# Patient Record
Sex: Female | Born: 1935 | Race: White | Hispanic: No | Smoking: Never smoker
Health system: Southern US, Community
[De-identification: ages and names within clinical notes are randomized; demographics above are authoritative.]

## PROBLEM LIST (undated history)

## (undated) DIAGNOSIS — T7840XA Allergy, unspecified, initial encounter: Secondary | ICD-10-CM

## (undated) DIAGNOSIS — J189 Pneumonia, unspecified organism: Secondary | ICD-10-CM

## (undated) DIAGNOSIS — M8580 Other specified disorders of bone density and structure, unspecified site: Secondary | ICD-10-CM

## (undated) DIAGNOSIS — K759 Inflammatory liver disease, unspecified: Secondary | ICD-10-CM

## (undated) DIAGNOSIS — E0590 Thyrotoxicosis, unspecified without thyrotoxic crisis or storm: Secondary | ICD-10-CM

## (undated) DIAGNOSIS — G8929 Other chronic pain: Secondary | ICD-10-CM

## (undated) DIAGNOSIS — N6019 Diffuse cystic mastopathy of unspecified breast: Secondary | ICD-10-CM

## (undated) DIAGNOSIS — Z87898 Personal history of other specified conditions: Secondary | ICD-10-CM

## (undated) DIAGNOSIS — G912 (Idiopathic) normal pressure hydrocephalus: Secondary | ICD-10-CM

## (undated) DIAGNOSIS — M810 Age-related osteoporosis without current pathological fracture: Secondary | ICD-10-CM

## (undated) DIAGNOSIS — E079 Disorder of thyroid, unspecified: Secondary | ICD-10-CM

## (undated) DIAGNOSIS — K219 Gastro-esophageal reflux disease without esophagitis: Secondary | ICD-10-CM

## (undated) DIAGNOSIS — Z982 Presence of cerebrospinal fluid drainage device: Secondary | ICD-10-CM

## (undated) DIAGNOSIS — E559 Vitamin D deficiency, unspecified: Secondary | ICD-10-CM

## (undated) DIAGNOSIS — M25551 Pain in right hip: Secondary | ICD-10-CM

## (undated) DIAGNOSIS — M779 Enthesopathy, unspecified: Secondary | ICD-10-CM

## (undated) DIAGNOSIS — E042 Nontoxic multinodular goiter: Secondary | ICD-10-CM

## (undated) DIAGNOSIS — E210 Primary hyperparathyroidism: Secondary | ICD-10-CM

## (undated) DIAGNOSIS — I10 Essential (primary) hypertension: Secondary | ICD-10-CM

## (undated) DIAGNOSIS — M1712 Unilateral primary osteoarthritis, left knee: Secondary | ICD-10-CM

## (undated) DIAGNOSIS — E785 Hyperlipidemia, unspecified: Secondary | ICD-10-CM

## (undated) HISTORY — PX: CATARACT EXTRACTION: SUR2

## (undated) HISTORY — PX: EYE SURGERY: SHX253

## (undated) HISTORY — DX: Unilateral primary osteoarthritis, left knee: M17.12

## (undated) HISTORY — DX: Enthesopathy, unspecified: M77.9

## (undated) HISTORY — DX: Personal history of other specified conditions: Z87.898

## (undated) HISTORY — DX: Gastro-esophageal reflux disease without esophagitis: K21.9

## (undated) HISTORY — PX: COLON SURGERY: SHX602

## (undated) HISTORY — PX: APPENDECTOMY: SHX54

## (undated) HISTORY — DX: Other chronic pain: G89.29

## (undated) HISTORY — DX: Nontoxic multinodular goiter: E04.2

## (undated) HISTORY — DX: Age-related osteoporosis without current pathological fracture: M81.0

## (undated) HISTORY — DX: Essential (primary) hypertension: I10

## (undated) HISTORY — DX: Other specified disorders of bone density and structure, unspecified site: M85.80

## (undated) HISTORY — PX: SHOULDER ARTHROSCOPY W/ ACROMIAL REPAIR: SUR94

## (undated) HISTORY — DX: Vitamin D deficiency, unspecified: E55.9

## (undated) HISTORY — DX: Disorder of thyroid, unspecified: E07.9

## (undated) HISTORY — PX: BREAST SURGERY: SHX581

## (undated) HISTORY — PX: FRACTURE SURGERY: SHX138

## (undated) HISTORY — DX: Pain in right hip: M25.551

## (undated) HISTORY — PX: TUBAL LIGATION: SHX77

## (undated) HISTORY — PX: ABDOMINAL HYSTERECTOMY: SHX81

## (undated) HISTORY — PX: BREAST EXCISIONAL BIOPSY: SUR124

## (undated) HISTORY — DX: Allergy, unspecified, initial encounter: T78.40XA

## (undated) HISTORY — DX: Hyperlipidemia, unspecified: E78.5

## (undated) HISTORY — DX: Primary hyperparathyroidism: E21.0

## (undated) HISTORY — DX: Presence of cerebrospinal fluid drainage device: Z98.2

## (undated) HISTORY — DX: (Idiopathic) normal pressure hydrocephalus: G91.2

---

## 1983-03-03 HISTORY — PX: ACOUSTIC NEUROMA RESECTION: SHX5713

## 2010-01-20 DIAGNOSIS — Z86018 Personal history of other benign neoplasm: Secondary | ICD-10-CM | POA: Insufficient documentation

## 2010-01-20 DIAGNOSIS — S42209A Unspecified fracture of upper end of unspecified humerus, initial encounter for closed fracture: Secondary | ICD-10-CM | POA: Insufficient documentation

## 2010-01-20 DIAGNOSIS — K635 Polyp of colon: Secondary | ICD-10-CM | POA: Insufficient documentation

## 2010-01-20 DIAGNOSIS — I10 Essential (primary) hypertension: Secondary | ICD-10-CM | POA: Insufficient documentation

## 2010-01-20 DIAGNOSIS — M8580 Other specified disorders of bone density and structure, unspecified site: Secondary | ICD-10-CM

## 2010-01-20 DIAGNOSIS — D333 Benign neoplasm of cranial nerves: Secondary | ICD-10-CM

## 2010-01-20 DIAGNOSIS — M1712 Unilateral primary osteoarthritis, left knee: Secondary | ICD-10-CM

## 2010-01-20 DIAGNOSIS — R609 Edema, unspecified: Secondary | ICD-10-CM | POA: Insufficient documentation

## 2010-01-20 HISTORY — DX: Benign neoplasm of cranial nerves: D33.3

## 2010-01-20 HISTORY — DX: Other specified disorders of bone density and structure, unspecified site: M85.80

## 2010-01-20 HISTORY — DX: Unilateral primary osteoarthritis, left knee: M17.12

## 2011-06-02 ENCOUNTER — Ambulatory Visit: Payer: Self-pay | Admitting: Internal Medicine

## 2011-06-30 ENCOUNTER — Ambulatory Visit: Payer: Self-pay | Admitting: Gynecologic Oncology

## 2011-07-01 ENCOUNTER — Ambulatory Visit: Payer: Self-pay | Admitting: Gynecologic Oncology

## 2011-08-19 ENCOUNTER — Ambulatory Visit: Payer: Self-pay | Admitting: Gynecologic Oncology

## 2011-08-20 LAB — CA 125: CA 125: 16.9 U/mL (ref 0.0–34.0)

## 2011-08-25 LAB — CREATININE, SERUM: EGFR (Non-African Amer.): >60

## 2011-08-31 ENCOUNTER — Ambulatory Visit: Payer: Self-pay | Admitting: Gynecologic Oncology

## 2011-10-01 ENCOUNTER — Ambulatory Visit: Payer: Self-pay | Admitting: Gynecologic Oncology

## 2011-10-20 ENCOUNTER — Ambulatory Visit: Payer: Self-pay | Admitting: Gynecologic Oncology

## 2011-10-28 ENCOUNTER — Ambulatory Visit: Payer: Self-pay | Admitting: Gynecologic Oncology

## 2011-10-28 LAB — BASIC METABOLIC PANEL
BUN: 11 mg/dL (ref 7–18)
Calcium, Total: 9.6 mg/dL (ref 8.5–10.1)
Creatinine: 0.66 mg/dL (ref 0.60–1.30)
EGFR (African American): >60
EGFR (Non-African Amer.): >60
Glucose: 81 mg/dL (ref 65–99)
Osmolality: 278 (ref 275–301)
Potassium: 3.1 mmol/L — ABNORMAL LOW (ref 3.5–5.1)
Sodium: 140 mmol/L (ref 136–145)

## 2011-10-28 LAB — CBC
HGB: 12.8 g/dL (ref 12.0–16.0)
MCV: 89 fL (ref 80–100)
Platelet: 262 10*3/uL (ref 150–440)
RBC: 4.23 10*6/uL (ref 3.80–5.20)
RDW: 13.3 % (ref 11.5–14.5)
WBC: 6.8 10*3/uL (ref 3.6–11.0)

## 2011-11-01 ENCOUNTER — Ambulatory Visit: Payer: Self-pay | Admitting: Gynecologic Oncology

## 2011-11-10 ENCOUNTER — Ambulatory Visit: Payer: Self-pay | Admitting: Gynecologic Oncology

## 2011-12-01 ENCOUNTER — Ambulatory Visit: Payer: Self-pay

## 2012-02-10 ENCOUNTER — Ambulatory Visit: Payer: Self-pay | Admitting: Podiatry

## 2012-04-20 ENCOUNTER — Ambulatory Visit: Payer: Self-pay | Admitting: Internal Medicine

## 2012-05-23 DIAGNOSIS — E042 Nontoxic multinodular goiter: Secondary | ICD-10-CM | POA: Insufficient documentation

## 2012-05-23 HISTORY — DX: Nontoxic multinodular goiter: E04.2

## 2012-06-30 ENCOUNTER — Emergency Department: Payer: Self-pay | Admitting: Emergency Medicine

## 2012-06-30 LAB — TROPONIN I: Troponin-I: <0.02 ng/mL

## 2012-06-30 LAB — COMPREHENSIVE METABOLIC PANEL
Albumin: 3.8 g/dL (ref 3.4–5.0)
BUN: 17 mg/dL (ref 7–18)
Bilirubin,Total: 0.3 mg/dL (ref 0.2–1.0)
Chloride: 110 mmol/L — ABNORMAL HIGH (ref 98–107)
Creatinine: 0.59 mg/dL — ABNORMAL LOW (ref 0.60–1.30)
EGFR (African American): >60
EGFR (Non-African Amer.): >60
Glucose: 97 mg/dL (ref 65–99)
Osmolality: 288 (ref 275–301)
Potassium: 3.6 mmol/L (ref 3.5–5.1)
SGOT(AST): 31 U/L (ref 15–37)
SGPT (ALT): 31 U/L (ref 12–78)

## 2012-06-30 LAB — URINALYSIS, COMPLETE
Bilirubin,UR: NEGATIVE
Blood: NEGATIVE
Glucose,UR: NEGATIVE mg/dL (ref 0–75)
Ketone: NEGATIVE
Nitrite: NEGATIVE
Ph: 7 (ref 4.5–8.0)
Protein: NEGATIVE
WBC UR: 2 /HPF (ref 0–5)

## 2012-06-30 LAB — CBC
HCT: 38 % (ref 35.0–47.0)
MCH: 29.6 pg (ref 26.0–34.0)
MCHC: 34.2 g/dL (ref 32.0–36.0)
Platelet: 251 10*3/uL (ref 150–440)
WBC: 6.2 10*3/uL (ref 3.6–11.0)

## 2012-07-22 ENCOUNTER — Observation Stay: Payer: Self-pay | Admitting: Internal Medicine

## 2012-07-22 LAB — URINALYSIS, COMPLETE
Bilirubin,UR: NEGATIVE
Glucose,UR: NEGATIVE mg/dL (ref 0–75)
Hyaline Cast: 9
Ketone: NEGATIVE
Nitrite: NEGATIVE
Protein: NEGATIVE
RBC,UR: 1 /HPF (ref 0–5)

## 2012-07-22 LAB — COMPREHENSIVE METABOLIC PANEL
Albumin: 4.3 g/dL (ref 3.4–5.0)
Alkaline Phosphatase: 88 U/L (ref 50–136)
Anion Gap: 6 — ABNORMAL LOW (ref 7–16)
BUN: 10 mg/dL (ref 7–18)
Bilirubin,Total: 0.3 mg/dL (ref 0.2–1.0)
Calcium, Total: 10.4 mg/dL — ABNORMAL HIGH (ref 8.5–10.1)
Chloride: 108 mmol/L — ABNORMAL HIGH (ref 98–107)
Creatinine: 0.68 mg/dL (ref 0.60–1.30)
EGFR (African American): >60
EGFR (Non-African Amer.): >60
Glucose: 96 mg/dL (ref 65–99)
Potassium: 3.6 mmol/L (ref 3.5–5.1)
SGOT(AST): 32 U/L (ref 15–37)
Sodium: 142 mmol/L (ref 136–145)

## 2012-07-22 LAB — CBC
HCT: 42.7 % (ref 35.0–47.0)
HGB: 14.4 g/dL (ref 12.0–16.0)
MCV: 86 fL (ref 80–100)
RDW: 13.4 % (ref 11.5–14.5)
WBC: 7.6 10*3/uL (ref 3.6–11.0)

## 2012-07-23 LAB — CBC WITH DIFFERENTIAL/PLATELET
Eosinophil %: 1.8 %
HCT: 36.8 % (ref 35.0–47.0)
HGB: 12.8 g/dL (ref 12.0–16.0)
Lymphocyte %: 30.5 %
MCH: 29.6 pg (ref 26.0–34.0)
MCHC: 34.6 g/dL (ref 32.0–36.0)
MCV: 86 fL (ref 80–100)
Neutrophil %: 56.9 %
RBC: 4.3 10*6/uL (ref 3.80–5.20)
RDW: 13.1 % (ref 11.5–14.5)
WBC: 7.5 10*3/uL (ref 3.6–11.0)

## 2012-07-23 LAB — BASIC METABOLIC PANEL
Anion Gap: 5 — ABNORMAL LOW (ref 7–16)
Calcium, Total: 9.8 mg/dL (ref 8.5–10.1)
Potassium: 3.4 mmol/L — ABNORMAL LOW (ref 3.5–5.1)
Sodium: 142 mmol/L (ref 136–145)

## 2012-08-26 DIAGNOSIS — R269 Unspecified abnormalities of gait and mobility: Secondary | ICD-10-CM | POA: Insufficient documentation

## 2012-09-08 HISTORY — PX: VENTRICULOPERITONEAL SHUNT: SHX204

## 2012-09-08 HISTORY — PX: BRAIN SURGERY: SHX531

## 2012-11-05 DIAGNOSIS — G912 (Idiopathic) normal pressure hydrocephalus: Secondary | ICD-10-CM

## 2012-11-05 HISTORY — DX: (Idiopathic) normal pressure hydrocephalus: G91.2

## 2013-02-01 ENCOUNTER — Ambulatory Visit: Payer: Self-pay | Admitting: Family Medicine

## 2013-05-03 ENCOUNTER — Ambulatory Visit: Payer: Self-pay | Admitting: Pediatrics

## 2013-08-02 DIAGNOSIS — E210 Primary hyperparathyroidism: Secondary | ICD-10-CM

## 2013-08-02 HISTORY — DX: Primary hyperparathyroidism: E21.0

## 2013-08-30 IMAGING — CR DG CHEST 1V PORT
1 series · 1 of 1 positions shown · non-contrast
Comparison: none

REASON FOR EXAM: syncopy
COMMENTS:

PROCEDURE:     DXR - DXR PORTABLE CHEST SINGLE VIEW  - July 22, 2012  [DATE]
RESULT:     Comparison: None.

[ap]
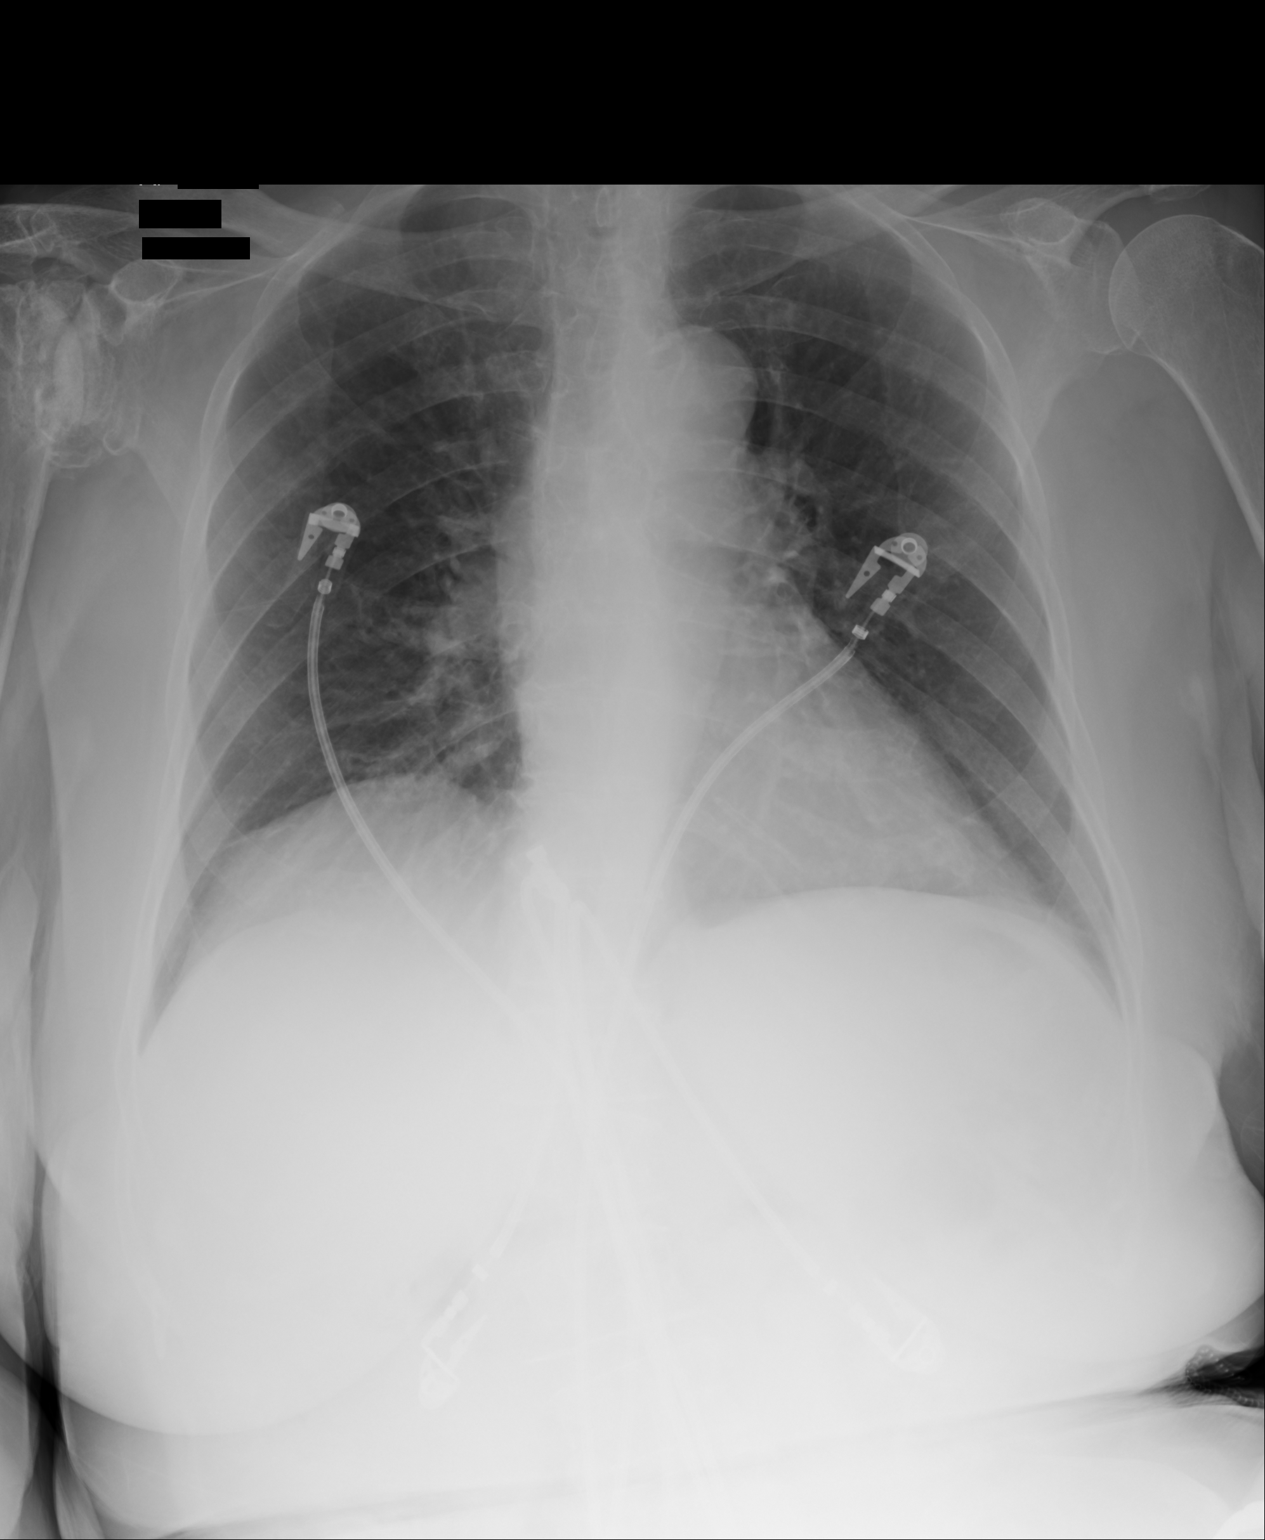

[1 of 1 positions shown; findings below may reference images not displayed]

FINDINGS: The heart is normal in size. No focal pulmonary opacities. There are
extensive degenerative changes in the right shoulder.
IMPRESSION: No acute cardiopulmonary disease.

[REDACTED]

## 2013-11-30 DIAGNOSIS — G912 (Idiopathic) normal pressure hydrocephalus: Secondary | ICD-10-CM

## 2013-11-30 HISTORY — DX: (Idiopathic) normal pressure hydrocephalus: G91.2

## 2014-03-14 DIAGNOSIS — M5442 Lumbago with sciatica, left side: Secondary | ICD-10-CM | POA: Diagnosis not present

## 2014-03-14 DIAGNOSIS — M9903 Segmental and somatic dysfunction of lumbar region: Secondary | ICD-10-CM | POA: Diagnosis not present

## 2014-03-14 DIAGNOSIS — M9905 Segmental and somatic dysfunction of pelvic region: Secondary | ICD-10-CM | POA: Diagnosis not present

## 2014-03-14 DIAGNOSIS — M955 Acquired deformity of pelvis: Secondary | ICD-10-CM | POA: Diagnosis not present

## 2014-03-14 DIAGNOSIS — M546 Pain in thoracic spine: Secondary | ICD-10-CM | POA: Diagnosis not present

## 2014-03-14 DIAGNOSIS — M9902 Segmental and somatic dysfunction of thoracic region: Secondary | ICD-10-CM | POA: Diagnosis not present

## 2014-04-10 DIAGNOSIS — M9901 Segmental and somatic dysfunction of cervical region: Secondary | ICD-10-CM | POA: Diagnosis not present

## 2014-04-10 DIAGNOSIS — M546 Pain in thoracic spine: Secondary | ICD-10-CM | POA: Diagnosis not present

## 2014-04-10 DIAGNOSIS — M955 Acquired deformity of pelvis: Secondary | ICD-10-CM | POA: Diagnosis not present

## 2014-04-10 DIAGNOSIS — M5441 Lumbago with sciatica, right side: Secondary | ICD-10-CM | POA: Diagnosis not present

## 2014-04-10 DIAGNOSIS — M9902 Segmental and somatic dysfunction of thoracic region: Secondary | ICD-10-CM | POA: Diagnosis not present

## 2014-04-10 DIAGNOSIS — M9905 Segmental and somatic dysfunction of pelvic region: Secondary | ICD-10-CM | POA: Diagnosis not present

## 2014-04-24 DIAGNOSIS — I10 Essential (primary) hypertension: Secondary | ICD-10-CM | POA: Diagnosis not present

## 2014-04-24 DIAGNOSIS — E210 Primary hyperparathyroidism: Secondary | ICD-10-CM | POA: Diagnosis not present

## 2014-04-24 DIAGNOSIS — R413 Other amnesia: Secondary | ICD-10-CM | POA: Diagnosis not present

## 2014-04-24 DIAGNOSIS — Z79899 Other long term (current) drug therapy: Secondary | ICD-10-CM | POA: Diagnosis not present

## 2014-04-24 DIAGNOSIS — Z23 Encounter for immunization: Secondary | ICD-10-CM | POA: Diagnosis not present

## 2014-04-24 DIAGNOSIS — Z6823 Body mass index (BMI) 23.0-23.9, adult: Secondary | ICD-10-CM | POA: Diagnosis not present

## 2014-04-24 DIAGNOSIS — E042 Nontoxic multinodular goiter: Secondary | ICD-10-CM | POA: Diagnosis not present

## 2014-04-24 DIAGNOSIS — Z1231 Encounter for screening mammogram for malignant neoplasm of breast: Secondary | ICD-10-CM | POA: Diagnosis not present

## 2014-04-24 DIAGNOSIS — M25561 Pain in right knee: Secondary | ICD-10-CM | POA: Diagnosis not present

## 2014-04-24 DIAGNOSIS — E785 Hyperlipidemia, unspecified: Secondary | ICD-10-CM | POA: Diagnosis not present

## 2014-04-24 DIAGNOSIS — Z0000 Encounter for general adult medical examination without abnormal findings: Secondary | ICD-10-CM | POA: Diagnosis not present

## 2014-05-08 ENCOUNTER — Ambulatory Visit: Payer: Self-pay | Admitting: Pediatrics

## 2014-05-08 DIAGNOSIS — M955 Acquired deformity of pelvis: Secondary | ICD-10-CM | POA: Diagnosis not present

## 2014-05-08 DIAGNOSIS — M9902 Segmental and somatic dysfunction of thoracic region: Secondary | ICD-10-CM | POA: Diagnosis not present

## 2014-05-08 DIAGNOSIS — Z1231 Encounter for screening mammogram for malignant neoplasm of breast: Secondary | ICD-10-CM | POA: Diagnosis not present

## 2014-05-08 DIAGNOSIS — M9905 Segmental and somatic dysfunction of pelvic region: Secondary | ICD-10-CM | POA: Diagnosis not present

## 2014-05-08 DIAGNOSIS — M9901 Segmental and somatic dysfunction of cervical region: Secondary | ICD-10-CM | POA: Diagnosis not present

## 2014-05-08 DIAGNOSIS — M546 Pain in thoracic spine: Secondary | ICD-10-CM | POA: Diagnosis not present

## 2014-05-08 DIAGNOSIS — M5441 Lumbago with sciatica, right side: Secondary | ICD-10-CM | POA: Diagnosis not present

## 2014-05-21 DIAGNOSIS — E213 Hyperparathyroidism, unspecified: Secondary | ICD-10-CM | POA: Diagnosis not present

## 2014-05-21 DIAGNOSIS — E210 Primary hyperparathyroidism: Secondary | ICD-10-CM | POA: Diagnosis not present

## 2014-05-21 DIAGNOSIS — E042 Nontoxic multinodular goiter: Secondary | ICD-10-CM | POA: Diagnosis not present

## 2014-05-24 DIAGNOSIS — R569 Unspecified convulsions: Secondary | ICD-10-CM | POA: Diagnosis not present

## 2014-05-24 DIAGNOSIS — R42 Dizziness and giddiness: Secondary | ICD-10-CM | POA: Diagnosis not present

## 2014-05-24 DIAGNOSIS — R6889 Other general symptoms and signs: Secondary | ICD-10-CM | POA: Diagnosis not present

## 2014-05-24 DIAGNOSIS — R413 Other amnesia: Secondary | ICD-10-CM | POA: Diagnosis not present

## 2014-06-06 DIAGNOSIS — M546 Pain in thoracic spine: Secondary | ICD-10-CM | POA: Diagnosis not present

## 2014-06-06 DIAGNOSIS — M5441 Lumbago with sciatica, right side: Secondary | ICD-10-CM | POA: Diagnosis not present

## 2014-06-06 DIAGNOSIS — M9902 Segmental and somatic dysfunction of thoracic region: Secondary | ICD-10-CM | POA: Diagnosis not present

## 2014-06-06 DIAGNOSIS — M9901 Segmental and somatic dysfunction of cervical region: Secondary | ICD-10-CM | POA: Diagnosis not present

## 2014-06-06 DIAGNOSIS — M9905 Segmental and somatic dysfunction of pelvic region: Secondary | ICD-10-CM | POA: Diagnosis not present

## 2014-06-06 DIAGNOSIS — M955 Acquired deformity of pelvis: Secondary | ICD-10-CM | POA: Diagnosis not present

## 2014-06-19 NOTE — Op Note (Signed)
PATIENT NAME:  Danielle, Herrera MR#:  [id number] DATE OF BIRTH:  [birth year]  DATE OF PROCEDURE:  11/10/2011  PREOPERATIVE DIAGNOSIS: Pelvic mass.   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   PROCEDURE PERFORMED: Laparoscopy with bilateral salpingo-oophorectomy.   SURGEON: Weber Cooks, M.D.   ANESTHESIA: General.   ESTIMATED BLOOD LOSS: 20 mL.   COMPLICATIONS: None.   INDICATION FOR SURGERY: Danielle Herrera is a 81 year old patient who complained of an episode of pelvic pain and on evaluation was noted to have a 5 cm slightly complex mass on the right ovary. The pain resolved but the decision was made to proceed with bilateral salpingo-oophorectomy.   FINDINGS AT TIME OF SURGERY: Uterus deformed by multiple fibroids. Left ovary within normal limits. Adhesions between sigmoid and left pelvic sidewall. Right ovary enlarged by smooth appearing cyst draining clear mucinous fluid. Tube within normal limits. No adhesions. No papillations. The remainder of inspection of the pelvis and abdomen within normal limits.   OPERATIVE REPORT: After adequate general anesthesia had been obtained, the patient was prepped and draped in ski position. A Foley catheter was inserted. The cervix was visualized with a single-tooth tenaculum and grasped. After a small incision, the cervical canal could be entered and a Hulka manipulator was inserted.   Attention was then directed towards the abdomen. A 1 cm incision was placed in the umbilicus. The fascia was identified and transected. The peritoneum was identified and entered. Inspection was done with the above-mentioned findings. Cytology was obtained.   The pelvic sidewall was entered on the right side. The vessels and ureter were identified. The infundibulopelvic ligament was cauterized and transected. The incision was then continued towards the uterus. Finally, the tube and utero-ovarian ligament were cauterized and transected. The same procedure  was performed on the left side after adhesions between sigmoid colon and pelvic sidewall were lysed. Again hemostasis was adequate here. The ovaries were placed in Endo Catch bags and removed through the umbilical incision while observing through a 5 mm port. At the end of the procedure, hemostasis was adequate. Ports were removed under direct vision.   The patient tolerated the procedure well and was taken to the recovery room in stable condition. The postoperative urine was clear. The Foley catheter was removed. Sponge, instrument and needle counts were correct x2. ____________________________ Weber Cooks, MD bem:slb D: 11/10/2011 16:17:59 ET T: 11/10/2011 16:54:46 ET JOB#: [id number]  cc: Weber Cooks, MD, <Dictator> Weber Cooks MD ELECTRONICALLY SIGNED 11/17/2011 9:53

## 2014-06-22 NOTE — H&P (Signed)
PATIENT NAME:  Danielle Herrera, CAMPUS MR#:  [id number] DATE OF BIRTH:  Aug 28, 1933  DATE OF ADMISSION:  07/22/2012  ADMITTING PHYSICIAN: Dr. Gladstone Lighter  PRIMARY PHYSICIAN: Dr. Halina Maidens  PRIMARY NEUROLOGIST: Dr. Melrose Nakayama    CHIEF COMPLAINT:  Syncope.  HISTORY OF PRESENT ILLNESS: Danielle Herrera is a 81 year old female with a past medical history significant for a history of acoustic neuroma, status post resection several years ago, multinodular goiter, hypertension, gastroesophageal reflux disease, who had admission to [facility] about 3 weeks ago for encephalopathy and not waking up. The patient was initially brought to [facility] Emergency Room. CT head revealed ventriculomegaly with prior history of shunt, so was transferred over to [facility]. She had extensive neurological workup done at [facility], including regular EEG and also MRI, which did not show any acute changes. She has moderate ventriculomegaly, which is stable, with no signs of obstructive hydrocephalus, and history of previous shunt related to a prior hydrocephalus, but none currently. She has been having unsteady gait since her acoustic neuroma surgery, and has decreased hearing and equilibrium because of decreased hearing since her neuroma surgery. She was discharged from [facility], and has seen Dr. Gurney Maxin from [facility], and is scheduled for a sleep-deprived EEG in the next 10 days. The patient has not been symptomatic, doing well, has been walking her dog today, and suddenly passed out for 15 seconds, according to bystanders. She has no witnessed seizure activity. She woke up not completely confused. She was aware of the surroundings. No urinary incontinence or tongue bite. She was brought to the ER. CT head shows moderate ventriculomegaly, which is stable, and all her labs look normal. She is being admitted under observation at this time.   PAST MEDICAL HISTORY: 1.  Hypertension.  2.  Multinodular goiter.  3.  Gastroesophageal  reflux disease.   PAST SURGICAL HISTORY: 1.  Oophorectomy and salpingectomy.  2.  Right shoulder surgery.  3. Acoustic neuroma resection on the right side, and had hydrocephalus, with temporary shunt placement at the time.  4.  Appendectomy.   ALLERGIES TO MEDICATIONS:  No known drug allergies.   HOME MEDICATIONS: 1.  Aspirin 81 mg p.o. daily.  2.  Amlodipine 10 mg p.o. daily.  3.  Atorvastatin 10 mg p.o. daily.  4.  Celebrex 200 mg p.o. daily.  5.  Centrum Silver 1 tablet p.o. daily.  6.  Lasix 20 mg p.o. daily.  7.  Nexium 40 mg p.o. daily.   SOCIAL HISTORY:  Lives at home by herself.   FAMILY HISTORY:  Mother with dementia and brother with heart disease.   REVIEW OF SYSTEMS:  CONSTITUTIONAL:  No fever, fatigue or weakness.  EYES:  No blurry vision, double vision, glaucoma or cataracts.  EARS, NOSE, THROAT:  Positive for hearing loss in the right ear since acoustic neuroma surgery, and also dizziness occasionally, but no tinnitus, ear pain, discharge or epistaxis.  RESPIRATORY:  No cough, wheeze, hemoptysis or COPD.   CARDIOVASCULAR:  No chest pain, orthopnea, edema, arrhythmia, palpitations or syncope.  GASTROINTESTINAL: No nausea, vomiting, abdominal pain, hematemesis or melena.  GENITOURINARY:  No dysuria, hematuria, renal calculus, frequency or incontinence.  ENDOCRINE:  No polyuria, nocturia, thyroid problems, heat or cold intolerance.  HEMATOLOGY:  No anemia, easy bruising or bleeding.  SKIN:  No acne, rash or lesions.  MUSCULOSKELETAL:  No neck, back, shoulder pain, arthritis or gout.  NEUROLOGIC:  Positive history of acoustic neuroma, status post surgery and ataxia. No CVA or TIA.  No seizure activity.  PSYCHOLOGICAL:  No anxiety, insomnia, depression.   PHYSICAL EXAMINATION: VITAL SIGNS: Temperature 98.2 degrees Fahrenheit, pulse 87, respirations 18, blood pressure 149/76, pulse ox 95% on room air.  GENERAL EXAM:  Well-built, well-nourished female lying in bed, not  in any acute distress.  HEENT: Normocephalic, atraumatic. Pupils equal, round, reacting to light. Anicteric sclerae. Extraocular movements intact. Nasopharynx with no discharge or erythema. Oropharynx clear, without erythema, mass or exudate. Ears:  No external lesions or discharge.  NECK:  Supple. No thyromegaly, JVD or carotid bruits. No lymphadenopathy.  LUNGS: Moving air bilaterally. No wheeze or crackles. No use of accessory muscles for breathing.  CARDIOVASCULAR:  S1, S2. Regular rate and rhythm. No murmurs, rubs or gallops.  ABDOMEN:  Soft, nontender, nondistended. No hepatosplenomegaly. Normal bowel sounds.  EXTREMITIES: No pedal edema. No clubbing or cyanosis. There are 2+ dorsalis pedis pulses palpable bilaterally.  SKIN:  No acne, rash or lesions.  LYMPHATICS:  No cervical lymphadenopathy.  NEUROLOGIC: Cranial nerves II through XII are intact, except right-side 8th nerve, with decreased hearing. Sensory exam is intact to touch, temperature and pinprick throughout. Motor exam is 5/5 both upper and lower extremities, and 2+ deep tendon reflexes in both legs, and also biceps jerks noted. Her gait is steady and normal. Rapid alternating moments on exam.  PSYCHIATRIC:  The patient is awake, alert, oriented x 3.   LAB DATA:  WBC 7.6, hemoglobin 14.4, hematocrit 42.7, platelet count 278. Sodium 142, potassium 3.6, chloride 108, bicarb 28, BUN 10, creatinine 0.68, glucose 96, calcium of 10.4. ALT 28, AST 32, alk phos 88, total bili 0.3, albumin of 4.3. Troponins negative. Urinalysis negative for any infection. Chest x-ray showing clear lung fields, no acute cardiopulmonary disease. CT of the head without contrast showing chronic small vessel ischemic disease, no acute intracranial process, and central atrophy with mild prominence of ventricular system is seen. EKG showing normal sinus rhythm, heart rate is 89, and no acute ST-T wave abnormalities.   ASSESSMENT AND PLAN: A 81 year old female with a  past medical history of acoustic neuroma, status post resection several years ago, with ventriculomegaly and temporary shunt at the time, hypertension, gastroesophageal reflux disease, who was brought in after a syncopal episode.   1.  Syncope. Likely neurogenic causes. Similar episode was noted 3 weeks ago, and she was worked up at [facility], including MRI, EEG, which were all benign. She was seen by Dr. Melrose Nakayama from Neurology, and has a sleep-deprived EEG scheduled in 10 days from now. Now with this second episode, will admit under observation, neuro checks will be done, gentle hydration, and follow up with Dr. Melrose Nakayama as an outpatient for that EEG. CT head did not show any acute changes. If she is stable, she will probably be discharged tomorrow, driving restrictions and other precautions for possible  seizures explained. Unfortunately, there is no neurology coverage over the weekend. If her symptoms repeat, then will get tele Neurology at that time.   2.  Hypertension. Continue Norvasc.   3.  Gastroesophageal reflux disease. Continue Protonix at this time.   4.  Hyperlipidemia. She is on statin.  5. Gastrointestinal and deep vein thrombosis prophylaxis. On Protonix and sequential compression devices.   CODE STATUS:  FULL CODE.   Time spent on admission is 50 minutes.      ____________________________ Gladstone Lighter, MD rk:mr D: 07/22/2012 20:37:16 ET T: 07/22/2012 20:56:50 ET JOB#: [id number]  cc: Gladstone Lighter, MD, <Dictator> Halina Maidens, MD [facility] Melrose Nakayama, MD  Gladstone Lighter MD ELECTRONICALLY SIGNED 07/26/2012 14:49

## 2014-06-22 NOTE — Discharge Summary (Signed)
PATIENT NAME:  Danielle Herrera, Danielle Herrera MR#:  [id number] DATE OF BIRTH:  Oct 04, 1933  DATE OF ADMISSION:  07/22/2012 DATE OF DISCHARGE:  07/23/2012  PRIMARY CARE PHYSICIAN:  Dr. Halina Maidens.  DISCHARGE DIAGNOSES: 1.  Syncope, possible neurogenic.  2.  Hypertension.   CONDITION:  Stable.   CODE STATUS:  FULL CODE.   HOME MEDICATIONS:  Nexium 40 mg by mouth daily, Norvasc 10 mg by mouth daily, Celebrex 200 mg by mouth daily, Lasix 20 mg by mouth daily, aspirin 81 mg by mouth daily, Centrum Silver by mouth tablets 1 tablet by mouth daily, atorvastatin 10 mg by mouth daily, acetaminophen 500 mg by mouth 2 tablets once a day at bedtime, vitamin D3 1000 international units 2 tablets once a day.   DIET:  Low sodium diet.   ACTIVITY:  As tolerated.   FOLLOW-UP CARE:  Follow up with PCP within 1 to 2 weeks and follow up with Dr. Melrose Nakayama, neurologist as soon as possible.   REASON FOR ADMISSION:  Syncope.   HOSPITAL COURSE:  The patient is a 81 year old Caucasian female with a history of acoustic neuroma status post a resection several years ago, multinodular goiter, hypertension, GERD, presented in the ED with a syncope episode.  Actually the patient had an admission to [facility] about three weeks ago.  She had an extensive work-up including CAT scan of head, MRI of brain which did not show any acute abnormality.  The patient is following up with Dr. Melrose Nakayama, neurologist, and was scheduled to have a sleep deprived EEG on June 10th, but the patient suddenly developed a syncope episode yesterday for about 15 seconds and was sent to the ED for further evaluation.  CAT scan of head shows moderate ventriculomegaly, which is stable.  For detailed history and physical examination, please refer to the admission note dictated by Dr. Tressia Miners.  On the admission date, the patient's WBC 7.6, hemoglobin 14.4, platelets 278.  Electrolytes are normal, BUN 10, creatinine 0.68.  CBC is normal.  Chest x-ray is clear.  The  patient was placed for observation due to syncope episode.  Since patient already had an extensive work-up including MRI, EEG, CAT scan, Dr. Tressia Miners did not give any further work-up.  The patient was placed on telemonitor which showed normal sinus rhythm. The patient has no complaints after admission.  Her hypertension is very well-controlled.  The patient was treated with IV fluid support. Since patient has no complaints, vital signs stable and physical examination is unremarkable, the patient will be discharged to home today.  The patient was advised to follow up with Dr. Melrose Nakayama as soon as possible.  I discussed the patient's discharge plan with the patient and the nurse.   TIME SPENT:  About 32 minutes.    ____________________________ Demetrios Loll, MD qc:ea D: 07/23/2012 14:38:11 ET T: 07/24/2012 01:02:50 ET JOB#: [id number]  cc: Demetrios Loll, MD, <Dictator> Demetrios Loll MD ELECTRONICALLY SIGNED 07/25/2012 14:47

## 2014-07-06 DIAGNOSIS — M5441 Lumbago with sciatica, right side: Secondary | ICD-10-CM | POA: Diagnosis not present

## 2014-07-06 DIAGNOSIS — M9901 Segmental and somatic dysfunction of cervical region: Secondary | ICD-10-CM | POA: Diagnosis not present

## 2014-07-06 DIAGNOSIS — M9902 Segmental and somatic dysfunction of thoracic region: Secondary | ICD-10-CM | POA: Diagnosis not present

## 2014-07-06 DIAGNOSIS — M955 Acquired deformity of pelvis: Secondary | ICD-10-CM | POA: Diagnosis not present

## 2014-07-06 DIAGNOSIS — M546 Pain in thoracic spine: Secondary | ICD-10-CM | POA: Diagnosis not present

## 2014-07-06 DIAGNOSIS — M9905 Segmental and somatic dysfunction of pelvic region: Secondary | ICD-10-CM | POA: Diagnosis not present

## 2014-07-16 DIAGNOSIS — E785 Hyperlipidemia, unspecified: Secondary | ICD-10-CM | POA: Diagnosis not present

## 2014-07-16 DIAGNOSIS — E210 Primary hyperparathyroidism: Secondary | ICD-10-CM | POA: Diagnosis not present

## 2014-07-16 DIAGNOSIS — Z79899 Other long term (current) drug therapy: Secondary | ICD-10-CM | POA: Diagnosis not present

## 2014-07-16 DIAGNOSIS — R42 Dizziness and giddiness: Secondary | ICD-10-CM | POA: Diagnosis not present

## 2014-07-16 DIAGNOSIS — I10 Essential (primary) hypertension: Secondary | ICD-10-CM | POA: Diagnosis not present

## 2014-07-16 DIAGNOSIS — R7989 Other specified abnormal findings of blood chemistry: Secondary | ICD-10-CM | POA: Diagnosis not present

## 2014-07-16 DIAGNOSIS — E042 Nontoxic multinodular goiter: Secondary | ICD-10-CM | POA: Diagnosis not present

## 2014-07-18 DIAGNOSIS — M9905 Segmental and somatic dysfunction of pelvic region: Secondary | ICD-10-CM | POA: Diagnosis not present

## 2014-07-18 DIAGNOSIS — M955 Acquired deformity of pelvis: Secondary | ICD-10-CM | POA: Diagnosis not present

## 2014-07-18 DIAGNOSIS — M9901 Segmental and somatic dysfunction of cervical region: Secondary | ICD-10-CM | POA: Diagnosis not present

## 2014-07-18 DIAGNOSIS — M546 Pain in thoracic spine: Secondary | ICD-10-CM | POA: Diagnosis not present

## 2014-07-18 DIAGNOSIS — M5441 Lumbago with sciatica, right side: Secondary | ICD-10-CM | POA: Diagnosis not present

## 2014-07-18 DIAGNOSIS — M9902 Segmental and somatic dysfunction of thoracic region: Secondary | ICD-10-CM | POA: Diagnosis not present

## 2014-07-23 DIAGNOSIS — I10 Essential (primary) hypertension: Secondary | ICD-10-CM | POA: Diagnosis not present

## 2014-07-23 DIAGNOSIS — E042 Nontoxic multinodular goiter: Secondary | ICD-10-CM | POA: Diagnosis not present

## 2014-07-23 DIAGNOSIS — Z6824 Body mass index (BMI) 24.0-24.9, adult: Secondary | ICD-10-CM | POA: Diagnosis not present

## 2014-07-24 DIAGNOSIS — L723 Sebaceous cyst: Secondary | ICD-10-CM | POA: Diagnosis not present

## 2014-07-24 DIAGNOSIS — H6120 Impacted cerumen, unspecified ear: Secondary | ICD-10-CM | POA: Diagnosis not present

## 2014-07-24 DIAGNOSIS — D333 Benign neoplasm of cranial nerves: Secondary | ICD-10-CM | POA: Diagnosis not present

## 2014-07-24 DIAGNOSIS — H8110 Benign paroxysmal vertigo, unspecified ear: Secondary | ICD-10-CM | POA: Diagnosis not present

## 2014-07-27 DIAGNOSIS — R42 Dizziness and giddiness: Secondary | ICD-10-CM | POA: Diagnosis not present

## 2014-08-07 DIAGNOSIS — H8113 Benign paroxysmal vertigo, bilateral: Secondary | ICD-10-CM | POA: Diagnosis not present

## 2014-08-10 DIAGNOSIS — M9902 Segmental and somatic dysfunction of thoracic region: Secondary | ICD-10-CM | POA: Diagnosis not present

## 2014-08-10 DIAGNOSIS — M546 Pain in thoracic spine: Secondary | ICD-10-CM | POA: Diagnosis not present

## 2014-08-10 DIAGNOSIS — M955 Acquired deformity of pelvis: Secondary | ICD-10-CM | POA: Diagnosis not present

## 2014-08-10 DIAGNOSIS — M9905 Segmental and somatic dysfunction of pelvic region: Secondary | ICD-10-CM | POA: Diagnosis not present

## 2014-08-10 DIAGNOSIS — M5441 Lumbago with sciatica, right side: Secondary | ICD-10-CM | POA: Diagnosis not present

## 2014-08-10 DIAGNOSIS — M9901 Segmental and somatic dysfunction of cervical region: Secondary | ICD-10-CM | POA: Diagnosis not present

## 2014-08-13 ENCOUNTER — Ambulatory Visit (INDEPENDENT_AMBULATORY_CARE_PROVIDER_SITE_OTHER): Payer: Medicare Other | Admitting: Family Medicine

## 2014-08-13 ENCOUNTER — Encounter: Payer: Self-pay | Admitting: Family Medicine

## 2014-08-13 VITALS — BP 120/72 | HR 84 | Ht 61.5 in | Wt 129.4 lb

## 2014-08-13 DIAGNOSIS — I10 Essential (primary) hypertension: Secondary | ICD-10-CM | POA: Diagnosis not present

## 2014-08-13 DIAGNOSIS — K219 Gastro-esophageal reflux disease without esophagitis: Secondary | ICD-10-CM | POA: Diagnosis not present

## 2014-08-13 DIAGNOSIS — E785 Hyperlipidemia, unspecified: Secondary | ICD-10-CM

## 2014-08-13 DIAGNOSIS — Z23 Encounter for immunization: Secondary | ICD-10-CM

## 2014-08-13 DIAGNOSIS — M129 Arthropathy, unspecified: Secondary | ICD-10-CM

## 2014-08-13 DIAGNOSIS — E559 Vitamin D deficiency, unspecified: Secondary | ICD-10-CM | POA: Diagnosis not present

## 2014-08-13 DIAGNOSIS — E042 Nontoxic multinodular goiter: Secondary | ICD-10-CM

## 2014-08-13 DIAGNOSIS — N6019 Diffuse cystic mastopathy of unspecified breast: Secondary | ICD-10-CM | POA: Insufficient documentation

## 2014-08-13 DIAGNOSIS — Z982 Presence of cerebrospinal fluid drainage device: Secondary | ICD-10-CM | POA: Insufficient documentation

## 2014-08-13 DIAGNOSIS — M1712 Unilateral primary osteoarthritis, left knee: Secondary | ICD-10-CM

## 2014-08-13 DIAGNOSIS — K635 Polyp of colon: Secondary | ICD-10-CM

## 2014-08-13 DIAGNOSIS — E782 Mixed hyperlipidemia: Secondary | ICD-10-CM | POA: Insufficient documentation

## 2014-08-13 DIAGNOSIS — E210 Primary hyperparathyroidism: Secondary | ICD-10-CM | POA: Diagnosis not present

## 2014-08-13 HISTORY — DX: Presence of cerebrospinal fluid drainage device: Z98.2

## 2014-08-13 HISTORY — DX: Gastro-esophageal reflux disease without esophagitis: K21.9

## 2014-08-13 HISTORY — DX: Vitamin D deficiency, unspecified: E55.9

## 2014-08-13 HISTORY — DX: Hyperlipidemia, unspecified: E78.5

## 2014-08-13 MED ORDER — VITAMIN D 50 MCG (2000 UT) PO CAPS
1.0000 | ORAL_CAPSULE | Freq: Every day | ORAL | Status: DC
Start: 2014-08-13 — End: 2017-10-21

## 2014-08-13 NOTE — Progress Notes (Signed)
Date:  08/13/2014   Name:  Danielle Herrera   DOB:  November 20, 1933   MRN:  [id number]  PCP:  Adline Potter, MD    Chief Complaint: Establish Care and Colonoscopy   History of Present Illness:  This is a 81 y.o. female with MMP as listed, former patient of Dr. Army Melia, requests referral for colonoscopy as hx colon polyps and last colonoscopy 6-7 years ago, told to have repeat study in 5 yrs. Hx hydrocephalus with shunt placement 2 yrs ago followed by Dr. Claybon Jabs at [facility]. Has been seeing ENT for dizziness, Epley maneuvers help. Hx multinodular goiter and hyperPTH also followed at [facility]. Had acoustic neuroma 1985, deaf in L ear, and L shoulder fx 2007, can't abduct past 90 deg. Some chronic ankle edema, not painful or positional. Hx osteoporosis on Boniva for 5 yrs, stopped when bone density improved. Has read LT use of Celebrex and Nexium concerning. Tetanus > 10 yrs, has had both pneumococcal imms, had lipids checked 3 months ago, told ok.  Review of Systems:  Review of Systems  Constitutional: Negative for fever and unexpected weight change.  HENT: Negative for sore throat and trouble swallowing.   Eyes: Negative for pain.  Respiratory: Negative for shortness of breath.   Cardiovascular: Negative for chest pain.  Gastrointestinal: Negative for abdominal pain and abdominal distention.  Endocrine: Negative for polyuria.  Genitourinary: Negative for difficulty urinating.  Musculoskeletal: Positive for back pain. Negative for myalgias.  Skin: Negative for rash.  Neurological: Positive for dizziness. Negative for seizures and headaches.  Hematological: Negative for adenopathy.  Psychiatric/Behavioral: Negative for confusion.    Patient Active Problem List   Diagnosis Date Noted  . 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 Calcium blood increased 08/13/2014  . HLD (hyperlipidemia) 08/13/2014  . Vitamin D deficiency 08/13/2014  . Ventricular shunt in place 08/13/2014  . Esophageal reflux 08/13/2014   . Hyperparathyroidism, primary 08/02/2013  . 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 Abnormal gait 08/26/2012  . Goiter, nontoxic, multinodular 05/23/2012  . Acoustic neuroma 01/20/2010  . Closed fracture of humerus, upper end 01/20/2010  . Colon polyp 01/20/2010  . Dependent edema 01/20/2010  . Essential (primary) hypertension 01/20/2010  . Arthritis of knee, left 01/20/2010  . Osteopenia 01/20/2010    Prior to Admission medications   Medication Sig Start Date End Date Taking? Authorizing Provider  amLODipine (NORVASC) 10 MG tablet Take 10 mg by mouth. 04/24/14  Yes Historical Provider, MD  aspirin 81 MG tablet Take 81 mg by mouth. 11/18/11  Yes Historical Provider, MD  atorvastatin (LIPITOR) 10 MG tablet Take 10 mg by mouth. 04/24/14  Yes Historical Provider, MD  Carboxymethylcellul-Glycerin (OPTIVE) 0.5-0.9 % SOLN Frequency:PRN   Dosage:0.0     Instructions:  Note:Dose: 0.5%-0.9% 11/18/11  Yes Historical Provider, MD  celecoxib (CELEBREX) 200 MG capsule Take 200 mg by mouth. 04/24/14  Yes Historical Provider, MD  Cholecalciferol (VITAMIN D3) 1000 UNITS CAPS Take by mouth.   Yes Historical Provider, MD  esomeprazole (NEXIUM) 40 MG capsule TAKE 1 CAPSULE EVERY       MORNING BEFORE BREAKFAST 07/04/14  Yes Historical Provider, MD  furosemide (LASIX) 20 MG tablet Take 20 mg by mouth. 04/24/14  Yes Historical Provider, MD  lisinopril (PRINIVIL,ZESTRIL) 10 MG tablet Take 10 mg by mouth. 04/24/14 04/24/15 Yes Historical Provider, MD  methimazole (TAPAZOLE) 5 MG tablet Take 5 mg by mouth. 07/17/14 07/17/15 Yes Historical Provider, MD  Multiple Vitamins-Minerals (CENTRUM SILVER ULTRA WOMENS) TABS Take by mouth. 11/18/11  Yes Historical  Provider, MD  sennosides-docusate sodium (SENOKOT-S) 8.6-50 MG tablet Take by mouth. 09/18/13  Yes Historical Provider, MD    No Known Allergies  Past Surgical History  Procedure Laterality Date  . Appendectomy    . Acoustic neuroma resection Right 1985  . Hydrcephalic Right  9458  . Breast surgery    . Colon surgery    . Shoulder arthroscopy w/ acromial repair Right 69yrs ago  . Tubal ligation      History  Substance Use Topics  . Smoking status: Not on file  . Smokeless tobacco: Not on file  . Alcohol Use: Not on file    Family History  Problem Relation Age of Onset  . Hypertension Mother   . Mental illness Mother   . Heart disease Father   . Hypertension Father   . Diabetes Sister   . Stroke Brother     Medication list has been reviewed and updated.  Physical Examination: BP 120/72 mmHg  Pulse 84  Ht 5' 1.5" (1.562 m)  Wt 129 lb 6.4 oz (58.695 kg)  BMI 24.06 kg/m2  Physical Exam  Constitutional: She is oriented to person, place, and time. She appears well-developed and well-nourished.  HENT:  Head: Normocephalic.  Mouth/Throat: Oropharynx is clear and moist.  Eyes: Conjunctivae are normal. Pupils are equal, round, and reactive to light.  Neck: Neck supple. No thyromegaly present.  Cardiovascular: Normal rate, regular rhythm and normal heart sounds.  Exam reveals no gallop.   No murmur heard. Pulmonary/Chest: Effort normal and breath sounds normal.  Abdominal: Soft. She exhibits no distension and no mass. There is no tenderness.  Musculoskeletal:  1+ B pedal edema  Lymphadenopathy:    She has no cervical adenopathy.  Neurological: She is alert and oriented to person, place, and time.  Gait sl wide-based, turns en bloc  Skin: No rash noted. She is not diaphoretic.  Psychiatric: She has a normal mood and affect. Her behavior is normal.    Assessment and Plan:  1. Essential (primary) hypertension Well controlled, continue current meds  2. Colon polyp F/u colonoscopy due - Ambulatory referral to General Surgery  3. HLD (hyperlipidemia) Profile ok 3 months ago per pt  4. Vitamin D deficiency On supplementation  5. Goiter, nontoxic, multinodular Followed by [facility] end[facility]  6. Ventricular shunt in place Followed by [facility]  7.  Hyperparathyroidism, primary Followed by [facility]  8. Arthritis of knee, left Discussed risk of LT Celebrex use, will try to switch to Tylenol 1000 mg bid  9. Gastroesophageal reflux disease, esophagitis presence not specified Discussed risk of LT Nexium use, will try to switch to Zantac bid - Ambulatory referral to General Surgery    No Follow-up on file.  Satira Anis. [facility]  08/13/2014

## 2014-08-14 ENCOUNTER — Telehealth: Payer: Self-pay | Admitting: Gastroenterology

## 2014-08-14 DIAGNOSIS — R42 Dizziness and giddiness: Secondary | ICD-10-CM | POA: Diagnosis not present

## 2014-08-14 NOTE — Telephone Encounter (Signed)
Pt has called and appt has been made with Dr Allen Norris for GERD and Colonoscopy per referral. 08/28/14.

## 2014-08-14 NOTE — Telephone Encounter (Signed)
I have called pt to make appt per referral for GI for GERD. No answer. I have left a VM.

## 2014-08-28 ENCOUNTER — Ambulatory Visit (INDEPENDENT_AMBULATORY_CARE_PROVIDER_SITE_OTHER): Payer: Medicare Other | Admitting: Gastroenterology

## 2014-08-28 VITALS — BP 141/83 | HR 86 | Temp 98.0°F | Resp 20 | Ht 62.0 in | Wt 129.0 lb

## 2014-08-28 DIAGNOSIS — K635 Polyp of colon: Secondary | ICD-10-CM | POA: Diagnosis not present

## 2014-08-28 DIAGNOSIS — K219 Gastro-esophageal reflux disease without esophagitis: Secondary | ICD-10-CM | POA: Diagnosis not present

## 2014-08-28 NOTE — Progress Notes (Signed)
Gastroenterology Consultation  Referring Provider:     Adline Potter, MD Primary Care Physician:  Adline Potter, MD Primary Gastroenterologist:  Dr. Allen Norris     Reason for Consultation:     History of colon polyps and reflux        HPI:   Danielle Herrera is a 81 y.o. y/o female referred for consultation & management of reflux and history of colon polyps by Dr. Adline Potter, MD.  As patient comes today with a history of colon polyps. The patient has had colon polyps at one of her colonoscopies but states that her subsequent colonoscopy did not have colon polyps. The patient does report that she had a female friend who recently died of colon cancer who did not take care of herself and she was concerned about her own health. The patient also reports that she has been on Nexium for her heartburn and it has been well controlled. She states that she accidentally missed one of her doses of Nexium and did not realize she missed until later in that day when she started to have pain in her chest which was burning. The patient then started taking her medication again and the pain resolved. She also states that her primary care provider had told her to switch her Nexium to ranitidine due to the side effects of PPIs. The patient has not done this yet, she states that her Nexium worked so well for her. She is not having any change in bowel habits and states that she is always been constipated and taking laxatives for it. She also denies any black stools or bloody stools. There is no report of any unexplained weight loss.  Past Medical History  Diagnosis Date  . Allergy   . Hypertension   . Osteoporosis   . Thyroid disease   . Esophageal reflux 08/13/2014  . Goiter, nontoxic, multinodular 05/23/2012  . Hyperparathyroidism, primary 08/02/2013  . 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 Arthritis of knee, left 01/20/2010  . Osteopenia 01/20/2010  . HLD (hyperlipidemia) 08/13/2014  . Vitamin D deficiency 08/13/2014  .  Ventricular shunt in place 08/13/2014    Past Surgical History  Procedure Laterality Date  . Appendectomy    . Acoustic neuroma resection Right 1985  . Hydrcephalic Right 1950  . Breast surgery    . Colon surgery    . Shoulder arthroscopy w/ acromial repair Right 61yrs ago  . Tubal ligation      Prior to Admission medications   Medication Sig Start Date End Date Taking? Authorizing Provider  amLODipine (NORVASC) 10 MG tablet Take 10 mg by mouth. 04/24/14  Yes Historical Provider, MD  aspirin 81 MG tablet Take 81 mg by mouth. 11/18/11  Yes Historical Provider, MD  atorvastatin (LIPITOR) 10 MG tablet Take 10 mg by mouth. 04/24/14  Yes Historical Provider, MD  Carboxymethylcellul-Glycerin (OPTIVE) 0.5-0.9 % SOLN 1 drop daily as needed. 11/18/11  Yes Historical Provider, MD  celecoxib (CELEBREX) 200 MG capsule Take 200 mg by mouth. 04/24/14  Yes Historical Provider, MD  Cholecalciferol (VITAMIN D) 2000 UNITS CAPS Take 1 capsule (2,000 Units total) by mouth daily. 08/13/14  Yes Adline Potter, MD  esomeprazole (NEXIUM) 40 MG capsule TAKE 1 CAPSULE EVERY       MORNING BEFORE BREAKFAST 07/04/14  Yes Historical Provider, MD  furosemide (LASIX) 20 MG tablet Take 20 mg by mouth. 04/24/14  Yes Historical Provider, MD  lisinopril (PRINIVIL,ZESTRIL) 10 MG tablet Take 10 mg by mouth. 04/24/14 04/24/15 Yes  Historical Provider, MD  methimazole (TAPAZOLE) 5 MG tablet Take 5 mg by mouth. 07/17/14 07/17/15 Yes Historical Provider, MD  Multiple Vitamins-Minerals (CENTRUM SILVER ULTRA WOMENS) TABS Take by mouth. 11/18/11  Yes Historical Provider, MD  sennosides-docusate sodium (SENOKOT-S) 8.6-50 MG tablet Take by mouth. 09/18/13  Yes Historical Provider, MD    Family History  Problem Relation Age of Onset  . Hypertension Mother   . Mental illness Mother   . Heart disease Father   . Hypertension Father   . Diabetes Sister   . Stroke Brother      History  Substance Use Topics  . Smoking status: Never Smoker   .  Smokeless tobacco: Never Used  . Alcohol Use: 0.6 oz/week    1 Standard drinks or equivalent per week    Allergies as of 08/28/2014  . (No Known Allergies)    Review of Systems:    All systems reviewed and negative except where noted in HPI.   Physical Exam:  BP 141/83 mmHg  Pulse 86  Temp(Src) 98 F (36.7 C) (Oral)  Resp 20  Ht 5\' 2"  (1.575 m)  Wt 129 lb (58.514 kg)  BMI 23.59 kg/m2 No LMP recorded. Patient is postmenopausal. Psych:  Alert and cooperative. Normal mood and affect. General:   Alert,  Well-developed, well-nourished, pleasant and cooperative in NAD Head:  Normocephalic and atraumatic. Eyes:  Sclera clear, no icterus.   Conjunctiva pink. Ears:  Normal auditory acuity. Nose:  No deformity, discharge, or lesions. Mouth:  No deformity or lesions,oropharynx pink & moist. Neck:  Supple; no masses or thyromegaly. Lungs:  Respirations even and unlabored.  Clear throughout to auscultation.   No wheezes, crackles, or rhonchi. No acute distress. Heart:  Regular rate and rhythm; no murmurs, clicks, rubs, or gallops. Abdomen:  Normal bowel sounds.  No bruits.  Soft, non-tender and non-distended without masses, hepatosplenomegaly or hernias noted.  No guarding or rebound tenderness.  Negative Carnett sign.   Rectal:  Deferred.  Msk:  Symmetrical without gross deformities.  Good, equal movement & strength bilaterally. Pulses:  Normal pulses noted. Extremities:  No clubbing or edema.  No cyanosis. Neurologic:  Alert and oriented x3;  grossly normal neurologically. Skin:  Intact without significant lesions or rashes.  No jaundice. Lymph Nodes:  No significant cervical adenopathy. Psych:  Alert and cooperative. Normal mood and affect.  Imaging Studies: No results found.  Assessment and Plan:   Danielle Herrera is a 81 y.o. y/o female who has a history of colon polyps and heartburn which is well controlled with her Nexium. The patient has been told of the colonoscopy  guidelines with starting at the age of 4 and stopping at the age of 35. She also has been told that because of her medication working on her heartburn there is no indication for an upper endoscopy. She has been told that switching to ranitidine may alleviate some of the risks associated with a PPI and that she should start doing that. The patient agrees that if it is not indicated for her to have a colonoscopy at this time that she would rather not go through with it. Patient has been explained the plan and agrees with it.

## 2014-08-29 DIAGNOSIS — M955 Acquired deformity of pelvis: Secondary | ICD-10-CM | POA: Diagnosis not present

## 2014-08-29 DIAGNOSIS — H2513 Age-related nuclear cataract, bilateral: Secondary | ICD-10-CM | POA: Diagnosis not present

## 2014-08-29 DIAGNOSIS — H35363 Drusen (degenerative) of macula, bilateral: Secondary | ICD-10-CM | POA: Diagnosis not present

## 2014-08-29 DIAGNOSIS — M546 Pain in thoracic spine: Secondary | ICD-10-CM | POA: Diagnosis not present

## 2014-08-29 DIAGNOSIS — M9901 Segmental and somatic dysfunction of cervical region: Secondary | ICD-10-CM | POA: Diagnosis not present

## 2014-08-29 DIAGNOSIS — M9902 Segmental and somatic dysfunction of thoracic region: Secondary | ICD-10-CM | POA: Diagnosis not present

## 2014-08-29 DIAGNOSIS — D3132 Benign neoplasm of left choroid: Secondary | ICD-10-CM | POA: Diagnosis not present

## 2014-08-29 DIAGNOSIS — M5441 Lumbago with sciatica, right side: Secondary | ICD-10-CM | POA: Diagnosis not present

## 2014-08-29 DIAGNOSIS — M9905 Segmental and somatic dysfunction of pelvic region: Secondary | ICD-10-CM | POA: Diagnosis not present

## 2014-09-13 DIAGNOSIS — Z79899 Other long term (current) drug therapy: Secondary | ICD-10-CM | POA: Diagnosis not present

## 2014-09-13 DIAGNOSIS — E0590 Thyrotoxicosis, unspecified without thyrotoxic crisis or storm: Secondary | ICD-10-CM | POA: Diagnosis not present

## 2014-09-13 DIAGNOSIS — E042 Nontoxic multinodular goiter: Secondary | ICD-10-CM | POA: Diagnosis not present

## 2014-09-26 ENCOUNTER — Other Ambulatory Visit: Payer: Self-pay | Admitting: Family Medicine

## 2014-09-26 DIAGNOSIS — E785 Hyperlipidemia, unspecified: Secondary | ICD-10-CM

## 2014-09-27 ENCOUNTER — Other Ambulatory Visit: Payer: Self-pay

## 2014-09-27 DIAGNOSIS — E785 Hyperlipidemia, unspecified: Secondary | ICD-10-CM | POA: Diagnosis not present

## 2014-09-28 ENCOUNTER — Other Ambulatory Visit: Payer: Self-pay

## 2014-09-28 LAB — LIPID PANEL
CHOLESTEROL TOTAL: 237 mg/dL — AB (ref 100–199)
Chol/HDL Ratio: 2.4 ratio units (ref 0.0–4.4)
HDL: 100 mg/dL (ref >39)
LDL CALC: 121 mg/dL — AB (ref 0–99)
TRIGLYCERIDES: 80 mg/dL (ref 0–149)
VLDL Cholesterol Cal: 16 mg/dL (ref 5–40)

## 2014-10-03 DIAGNOSIS — M546 Pain in thoracic spine: Secondary | ICD-10-CM | POA: Diagnosis not present

## 2014-10-03 DIAGNOSIS — M9902 Segmental and somatic dysfunction of thoracic region: Secondary | ICD-10-CM | POA: Diagnosis not present

## 2014-10-03 DIAGNOSIS — M9901 Segmental and somatic dysfunction of cervical region: Secondary | ICD-10-CM | POA: Diagnosis not present

## 2014-10-03 DIAGNOSIS — M5441 Lumbago with sciatica, right side: Secondary | ICD-10-CM | POA: Diagnosis not present

## 2014-10-03 DIAGNOSIS — M9905 Segmental and somatic dysfunction of pelvic region: Secondary | ICD-10-CM | POA: Diagnosis not present

## 2014-10-03 DIAGNOSIS — H8113 Benign paroxysmal vertigo, bilateral: Secondary | ICD-10-CM | POA: Diagnosis not present

## 2014-10-03 DIAGNOSIS — M955 Acquired deformity of pelvis: Secondary | ICD-10-CM | POA: Diagnosis not present

## 2014-10-05 DIAGNOSIS — H8112 Benign paroxysmal vertigo, left ear: Secondary | ICD-10-CM | POA: Diagnosis not present

## 2014-10-25 DIAGNOSIS — H8110 Benign paroxysmal vertigo, unspecified ear: Secondary | ICD-10-CM | POA: Diagnosis not present

## 2014-10-25 DIAGNOSIS — H8112 Benign paroxysmal vertigo, left ear: Secondary | ICD-10-CM | POA: Diagnosis not present

## 2014-11-01 DIAGNOSIS — M546 Pain in thoracic spine: Secondary | ICD-10-CM | POA: Diagnosis not present

## 2014-11-01 DIAGNOSIS — M9905 Segmental and somatic dysfunction of pelvic region: Secondary | ICD-10-CM | POA: Diagnosis not present

## 2014-11-01 DIAGNOSIS — M9902 Segmental and somatic dysfunction of thoracic region: Secondary | ICD-10-CM | POA: Diagnosis not present

## 2014-11-01 DIAGNOSIS — M9901 Segmental and somatic dysfunction of cervical region: Secondary | ICD-10-CM | POA: Diagnosis not present

## 2014-11-01 DIAGNOSIS — M955 Acquired deformity of pelvis: Secondary | ICD-10-CM | POA: Diagnosis not present

## 2014-11-01 DIAGNOSIS — M5441 Lumbago with sciatica, right side: Secondary | ICD-10-CM | POA: Diagnosis not present

## 2014-12-03 DIAGNOSIS — M9902 Segmental and somatic dysfunction of thoracic region: Secondary | ICD-10-CM | POA: Diagnosis not present

## 2014-12-03 DIAGNOSIS — M955 Acquired deformity of pelvis: Secondary | ICD-10-CM | POA: Diagnosis not present

## 2014-12-03 DIAGNOSIS — M5441 Lumbago with sciatica, right side: Secondary | ICD-10-CM | POA: Diagnosis not present

## 2014-12-03 DIAGNOSIS — M546 Pain in thoracic spine: Secondary | ICD-10-CM | POA: Diagnosis not present

## 2014-12-03 DIAGNOSIS — M9901 Segmental and somatic dysfunction of cervical region: Secondary | ICD-10-CM | POA: Diagnosis not present

## 2014-12-03 DIAGNOSIS — M9905 Segmental and somatic dysfunction of pelvic region: Secondary | ICD-10-CM | POA: Diagnosis not present

## 2014-12-17 DIAGNOSIS — M8580 Other specified disorders of bone density and structure, unspecified site: Secondary | ICD-10-CM | POA: Diagnosis not present

## 2014-12-17 DIAGNOSIS — Z79899 Other long term (current) drug therapy: Secondary | ICD-10-CM | POA: Diagnosis not present

## 2014-12-17 DIAGNOSIS — E0520 Thyrotoxicosis with toxic multinodular goiter without thyrotoxic crisis or storm: Secondary | ICD-10-CM | POA: Diagnosis not present

## 2014-12-17 DIAGNOSIS — E213 Hyperparathyroidism, unspecified: Secondary | ICD-10-CM | POA: Diagnosis not present

## 2014-12-17 DIAGNOSIS — E042 Nontoxic multinodular goiter: Secondary | ICD-10-CM | POA: Diagnosis not present

## 2014-12-17 DIAGNOSIS — E0590 Thyrotoxicosis, unspecified without thyrotoxic crisis or storm: Secondary | ICD-10-CM | POA: Diagnosis not present

## 2014-12-20 DIAGNOSIS — Z79899 Other long term (current) drug therapy: Secondary | ICD-10-CM | POA: Diagnosis not present

## 2014-12-20 DIAGNOSIS — E0590 Thyrotoxicosis, unspecified without thyrotoxic crisis or storm: Secondary | ICD-10-CM | POA: Diagnosis not present

## 2014-12-20 DIAGNOSIS — M8580 Other specified disorders of bone density and structure, unspecified site: Secondary | ICD-10-CM | POA: Diagnosis not present

## 2014-12-20 DIAGNOSIS — E213 Hyperparathyroidism, unspecified: Secondary | ICD-10-CM | POA: Diagnosis not present

## 2014-12-20 DIAGNOSIS — E042 Nontoxic multinodular goiter: Secondary | ICD-10-CM | POA: Diagnosis not present

## 2014-12-31 ENCOUNTER — Telehealth: Payer: Self-pay

## 2014-12-31 DIAGNOSIS — M546 Pain in thoracic spine: Secondary | ICD-10-CM | POA: Diagnosis not present

## 2014-12-31 DIAGNOSIS — M9901 Segmental and somatic dysfunction of cervical region: Secondary | ICD-10-CM | POA: Diagnosis not present

## 2014-12-31 DIAGNOSIS — M9902 Segmental and somatic dysfunction of thoracic region: Secondary | ICD-10-CM | POA: Diagnosis not present

## 2014-12-31 DIAGNOSIS — M9905 Segmental and somatic dysfunction of pelvic region: Secondary | ICD-10-CM | POA: Diagnosis not present

## 2014-12-31 DIAGNOSIS — M955 Acquired deformity of pelvis: Secondary | ICD-10-CM | POA: Diagnosis not present

## 2014-12-31 DIAGNOSIS — M5441 Lumbago with sciatica, right side: Secondary | ICD-10-CM | POA: Diagnosis not present

## 2014-12-31 MED ORDER — AMLODIPINE BESYLATE 10 MG PO TABS: 10.0000 mg | ORAL_TABLET | Freq: Every day | ORAL | Status: DC

## 2014-12-31 NOTE — Telephone Encounter (Signed)
Amlodipine refill sent

## 2014-12-31 NOTE — Addendum Note (Signed)
Addended by: Adline Potter on: 12/31/2014 03:28 PM   Modules accepted: Orders

## 2015-01-29 DIAGNOSIS — M546 Pain in thoracic spine: Secondary | ICD-10-CM | POA: Diagnosis not present

## 2015-01-29 DIAGNOSIS — M9902 Segmental and somatic dysfunction of thoracic region: Secondary | ICD-10-CM | POA: Diagnosis not present

## 2015-01-29 DIAGNOSIS — M955 Acquired deformity of pelvis: Secondary | ICD-10-CM | POA: Diagnosis not present

## 2015-01-29 DIAGNOSIS — M9905 Segmental and somatic dysfunction of pelvic region: Secondary | ICD-10-CM | POA: Diagnosis not present

## 2015-01-29 DIAGNOSIS — M9901 Segmental and somatic dysfunction of cervical region: Secondary | ICD-10-CM | POA: Diagnosis not present

## 2015-01-29 DIAGNOSIS — M5441 Lumbago with sciatica, right side: Secondary | ICD-10-CM | POA: Diagnosis not present

## 2015-02-19 ENCOUNTER — Ambulatory Visit (INDEPENDENT_AMBULATORY_CARE_PROVIDER_SITE_OTHER): Payer: Medicare Other | Admitting: Family Medicine

## 2015-02-19 ENCOUNTER — Encounter: Payer: Self-pay | Admitting: Family Medicine

## 2015-02-19 VITALS — BP 120/72 | HR 68 | Ht 61.0 in | Wt 130.0 lb

## 2015-02-19 DIAGNOSIS — I10 Essential (primary) hypertension: Secondary | ICD-10-CM | POA: Diagnosis not present

## 2015-02-19 DIAGNOSIS — E559 Vitamin D deficiency, unspecified: Secondary | ICD-10-CM | POA: Diagnosis not present

## 2015-02-19 DIAGNOSIS — K219 Gastro-esophageal reflux disease without esophagitis: Secondary | ICD-10-CM | POA: Diagnosis not present

## 2015-02-19 DIAGNOSIS — K635 Polyp of colon: Secondary | ICD-10-CM

## 2015-02-19 DIAGNOSIS — E785 Hyperlipidemia, unspecified: Secondary | ICD-10-CM

## 2015-02-19 DIAGNOSIS — E042 Nontoxic multinodular goiter: Secondary | ICD-10-CM

## 2015-02-19 DIAGNOSIS — M129 Arthropathy, unspecified: Secondary | ICD-10-CM

## 2015-02-19 DIAGNOSIS — M1712 Unilateral primary osteoarthritis, left knee: Secondary | ICD-10-CM

## 2015-02-19 DIAGNOSIS — E210 Primary hyperparathyroidism: Secondary | ICD-10-CM

## 2015-02-19 DIAGNOSIS — Z79899 Other long term (current) drug therapy: Secondary | ICD-10-CM

## 2015-02-19 DIAGNOSIS — M8580 Other specified disorders of bone density and structure, unspecified site: Secondary | ICD-10-CM

## 2015-02-19 MED ORDER — ACETAMINOPHEN 500 MG PO TABS: 1000.0000 mg | ORAL_TABLET | Freq: Two times a day (BID) | ORAL | Status: DC

## 2015-02-19 NOTE — Progress Notes (Signed)
Date:  02/19/2015   Name:  Danielle Herrera   DOB:  01-11-1934   MRN:  [id number]  PCP:  Adline Potter, MD    Chief Complaint: Follow-up   History of Present Illness:  This is a 81 y.o. female here for refill of Lipitor, no established CV disease, last HDL 100. Saw Dr. Allen Norris who did not recommend colonoscopy or EGD due to age and adequate GERD sxs control. Pt did try to switch form Nexium to Zantac but went back after 5d due to recurrent heartburn. Taking vit D supplement daily, no recent level in system. Goiter and HPTH followed at [facility]. Has not tried to change Celebrex to Tylenol yet. Off Boniva but [facility] wants to start on weekly injection for OP after last DEXA scan, waiting for approval, hopes it will help hip pain. Taking Tylenol PM and having some memory issues and dry mouth at night.  Review of Systems:  Review of Systems  Constitutional: Negative for fever.  Respiratory: Negative for shortness of breath.   Cardiovascular: Negative for chest pain and leg swelling.  Gastrointestinal: Negative for abdominal pain.  Neurological: Negative for syncope and light-headedness.    Patient Active Problem List   Diagnosis Date Noted  . Polypharmacy 02/19/2015  . 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 Calcium blood increased 08/13/2014  . HLD (hyperlipidemia) 08/13/2014  . Vitamin D deficiency 08/13/2014  . Ventricular shunt in place 08/13/2014  . Esophageal reflux 08/13/2014  . Hyperparathyroidism, primary ([place]) 08/02/2013  . 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 Abnormal gait 08/26/2012  . Goiter, nontoxic, multinodular 05/23/2012  . Acoustic neuroma ([place]) 01/20/2010  . Closed fracture of humerus, upper end 01/20/2010  . Colon polyp 01/20/2010  . Dependent edema 01/20/2010  . Essential (primary) hypertension 01/20/2010  . Arthritis of knee, left 01/20/2010  . Osteopenia 01/20/2010    Prior to Admission medications   Medication Sig Start Date End Date Taking? Authorizing Provider   amLODipine (NORVASC) 10 MG tablet Take 1 tablet (10 mg total) by mouth daily. 12/31/14  Yes Adline Potter, MD  aspirin 81 MG tablet Take 81 mg by mouth. 11/18/11  Yes Historical Provider, MD  atorvastatin (LIPITOR) 10 MG tablet Take 10 mg by mouth. 04/24/14  Yes Historical Provider, MD  Carboxymethylcellul-Glycerin (OPTIVE) 0.5-0.9 % SOLN 1 drop daily as needed. 11/18/11  Yes Historical Provider, MD  celecoxib (CELEBREX) 200 MG capsule Take 200 mg by mouth. 04/24/14  Yes Historical Provider, MD  Cholecalciferol (VITAMIN D) 2000 UNITS CAPS Take 1 capsule (2,000 Units total) by mouth daily. 08/13/14  Yes Adline Potter, MD  esomeprazole (NEXIUM) 40 MG capsule TAKE 1 CAPSULE EVERY       MORNING BEFORE BREAKFAST 07/04/14  Yes Historical Provider, MD  furosemide (LASIX) 20 MG tablet Take 20 mg by mouth. 04/24/14  Yes Historical Provider, MD  lisinopril (PRINIVIL,ZESTRIL) 10 MG tablet Take 10 mg by mouth. 04/24/14 04/24/15 Yes Historical Provider, MD  methimazole (TAPAZOLE) 5 MG tablet Take 5 mg by mouth. 07/17/14 07/17/15 Yes Historical Provider, MD  Multiple Vitamins-Minerals (CENTRUM SILVER ULTRA WOMENS) TABS Take by mouth. 11/18/11  Yes Historical Provider, MD  sennosides-docusate sodium (SENOKOT-S) 8.6-50 MG tablet Take by mouth. 09/18/13  Yes Historical Provider, MD    No Known Allergies  Past Surgical History  Procedure Laterality Date  . Appendectomy    . Acoustic neuroma resection Right 1985  . Hydrcephalic Right 123456  . Breast surgery    . Colon surgery    . Shoulder arthroscopy w/ acromial  repair Right 24yrs ago  . Tubal ligation      Social History  Substance Use Topics  . Smoking status: Never Smoker   . Smokeless tobacco: Never Used  . Alcohol Use: 0.6 oz/week    1 Standard drinks or equivalent per week    Family History  Problem Relation Age of Onset  . Hypertension Mother   . Mental illness Mother   . Heart disease Father   . Hypertension Father   . Diabetes Sister   . Stroke  Brother     Medication list has been reviewed and updated.  Physical Examination: BP 120/72 mmHg  Pulse 68  Ht 5\' 1"  (1.549 m)  Wt 130 lb (58.968 kg)  BMI 24.58 kg/m2  Physical Exam  Constitutional: She appears well-developed and well-nourished.  Cardiovascular: Normal rate, regular rhythm and normal heart sounds.   Pulmonary/Chest: Effort normal and breath sounds normal.  Musculoskeletal: She exhibits no edema.  Neurological: She is alert.  Skin: Skin is warm and dry.  Psychiatric: She has a normal mood and affect. Her behavior is normal.  Nursing note and vitals reviewed.   Assessment and Plan:  1. Essential (primary) hypertension Well controlled (home BP's reviewed), cont current regimen  2. Hyperparathyroidism, primary ([place]) Followed by [facility]  3. Goiter, nontoxic, multinodular Followed by [facility], on methimazole  4. Arthritis of knee, left Discussed LT risks Celebrex, rec change to Tylenol 1000 mg bid  5. Osteopenia Discussed lack of clinical benefit of treatment and no expectation of improvement in pain  6. HLD (hyperlipidemia) No clear indication for use of Lipitor, rec d/c, recheck lipids off Lipitor one month per pt request  7. Vitamin D deficiency Well controlled on supplement (47 on 12/17/14)  8. Gastroesophageal reflux disease, esophagitis presence not specified GI declined EGD, discussed rebound sxs off Nexium, consider retry change to Zantac  9. Colon polyp GI declined colonoscopy due to age  81. Polypharmacy Rec d/c Tylenol PM due to sign se's in elderly, also consider d/c asa given no clear indication for use   Return in about 6 months (around 08/20/2015).  Satira Anis. Italia Wolfert, [facility]  02/19/2015

## 2015-03-03 HISTORY — PX: PARATHYROIDECTOMY: SHX19

## 2015-03-05 ENCOUNTER — Other Ambulatory Visit: Payer: Self-pay | Admitting: Family Medicine

## 2015-03-05 MED ORDER — LISINOPRIL 10 MG PO TABS: 10.0000 mg | ORAL_TABLET | Freq: Every day | ORAL | Status: DC

## 2015-03-06 DIAGNOSIS — M9902 Segmental and somatic dysfunction of thoracic region: Secondary | ICD-10-CM | POA: Diagnosis not present

## 2015-03-06 DIAGNOSIS — M5441 Lumbago with sciatica, right side: Secondary | ICD-10-CM | POA: Diagnosis not present

## 2015-03-06 DIAGNOSIS — M9905 Segmental and somatic dysfunction of pelvic region: Secondary | ICD-10-CM | POA: Diagnosis not present

## 2015-03-06 DIAGNOSIS — M546 Pain in thoracic spine: Secondary | ICD-10-CM | POA: Diagnosis not present

## 2015-03-06 DIAGNOSIS — M9901 Segmental and somatic dysfunction of cervical region: Secondary | ICD-10-CM | POA: Diagnosis not present

## 2015-03-06 DIAGNOSIS — M955 Acquired deformity of pelvis: Secondary | ICD-10-CM | POA: Diagnosis not present

## 2015-03-07 ENCOUNTER — Other Ambulatory Visit: Payer: Self-pay | Admitting: Family Medicine

## 2015-03-07 MED ORDER — METHIMAZOLE 5 MG PO TABS: 5.0000 mg | ORAL_TABLET | Freq: Every day | ORAL | Status: DC

## 2015-03-25 ENCOUNTER — Other Ambulatory Visit: Payer: Medicare Other

## 2015-03-25 ENCOUNTER — Other Ambulatory Visit: Payer: Self-pay | Admitting: Family Medicine

## 2015-03-25 DIAGNOSIS — E785 Hyperlipidemia, unspecified: Secondary | ICD-10-CM | POA: Diagnosis not present

## 2015-03-26 ENCOUNTER — Telehealth: Payer: Self-pay

## 2015-03-26 LAB — LIPID PANEL
CHOLESTEROL TOTAL: 280 mg/dL — AB (ref 100–199)
Chol/HDL Ratio: 3.3 ratio units (ref 0.0–4.4)
HDL: 84 mg/dL (ref >39)
LDL Calculated: 172 mg/dL — ABNORMAL HIGH (ref 0–99)
Triglycerides: 120 mg/dL (ref 0–149)
VLDL Cholesterol Cal: 24 mg/dL (ref 5–40)

## 2015-03-26 NOTE — Telephone Encounter (Signed)
Spoke with pt. Pt. Advised of all results.  

## 2015-03-26 NOTE — Telephone Encounter (Signed)
Patient states that she saw her lab results on My Chart. She states that she does not feel comfortable being off of Lipitor with her Cholesterol levels being so high, after only 1 month of not taking medication. She would like to know what she should do. Patient seemed very concerned on phone today.

## 2015-03-26 NOTE — Telephone Encounter (Signed)
Advise her to look at her chol/HDL ratio, which is the best predictor of clinical outcomes. Her ratio is 3.3 which places her at half the average risk for heart attack or stroke, so there is no indication for treatment.

## 2015-04-03 DIAGNOSIS — M9901 Segmental and somatic dysfunction of cervical region: Secondary | ICD-10-CM | POA: Diagnosis not present

## 2015-04-03 DIAGNOSIS — M9902 Segmental and somatic dysfunction of thoracic region: Secondary | ICD-10-CM | POA: Diagnosis not present

## 2015-04-03 DIAGNOSIS — M9903 Segmental and somatic dysfunction of lumbar region: Secondary | ICD-10-CM | POA: Diagnosis not present

## 2015-04-03 DIAGNOSIS — M5442 Lumbago with sciatica, left side: Secondary | ICD-10-CM | POA: Diagnosis not present

## 2015-04-03 DIAGNOSIS — M546 Pain in thoracic spine: Secondary | ICD-10-CM | POA: Diagnosis not present

## 2015-04-03 DIAGNOSIS — M530 Cervicocranial syndrome: Secondary | ICD-10-CM | POA: Diagnosis not present

## 2015-04-04 DIAGNOSIS — R413 Other amnesia: Secondary | ICD-10-CM | POA: Diagnosis not present

## 2015-04-04 DIAGNOSIS — R569 Unspecified convulsions: Secondary | ICD-10-CM | POA: Diagnosis not present

## 2015-04-04 DIAGNOSIS — I10 Essential (primary) hypertension: Secondary | ICD-10-CM | POA: Diagnosis not present

## 2015-04-04 DIAGNOSIS — R6889 Other general symptoms and signs: Secondary | ICD-10-CM | POA: Diagnosis not present

## 2015-04-04 DIAGNOSIS — R42 Dizziness and giddiness: Secondary | ICD-10-CM | POA: Diagnosis not present

## 2015-04-22 DIAGNOSIS — E0590 Thyrotoxicosis, unspecified without thyrotoxic crisis or storm: Secondary | ICD-10-CM | POA: Insufficient documentation

## 2015-04-23 DIAGNOSIS — Z79899 Other long term (current) drug therapy: Secondary | ICD-10-CM | POA: Diagnosis not present

## 2015-04-23 DIAGNOSIS — E0590 Thyrotoxicosis, unspecified without thyrotoxic crisis or storm: Secondary | ICD-10-CM | POA: Diagnosis not present

## 2015-04-23 DIAGNOSIS — E042 Nontoxic multinodular goiter: Secondary | ICD-10-CM | POA: Diagnosis not present

## 2015-04-23 DIAGNOSIS — E210 Primary hyperparathyroidism: Secondary | ICD-10-CM | POA: Diagnosis not present

## 2015-04-25 DIAGNOSIS — G912 (Idiopathic) normal pressure hydrocephalus: Secondary | ICD-10-CM | POA: Diagnosis not present

## 2015-05-08 DIAGNOSIS — M5442 Lumbago with sciatica, left side: Secondary | ICD-10-CM | POA: Diagnosis not present

## 2015-05-08 DIAGNOSIS — M546 Pain in thoracic spine: Secondary | ICD-10-CM | POA: Diagnosis not present

## 2015-05-08 DIAGNOSIS — M9903 Segmental and somatic dysfunction of lumbar region: Secondary | ICD-10-CM | POA: Diagnosis not present

## 2015-05-08 DIAGNOSIS — M9902 Segmental and somatic dysfunction of thoracic region: Secondary | ICD-10-CM | POA: Diagnosis not present

## 2015-05-08 DIAGNOSIS — M530 Cervicocranial syndrome: Secondary | ICD-10-CM | POA: Diagnosis not present

## 2015-05-08 DIAGNOSIS — M9901 Segmental and somatic dysfunction of cervical region: Secondary | ICD-10-CM | POA: Diagnosis not present

## 2015-05-10 DIAGNOSIS — E210 Primary hyperparathyroidism: Secondary | ICD-10-CM | POA: Diagnosis not present

## 2015-05-10 DIAGNOSIS — E042 Nontoxic multinodular goiter: Secondary | ICD-10-CM | POA: Diagnosis not present

## 2015-05-10 DIAGNOSIS — E0520 Thyrotoxicosis with toxic multinodular goiter without thyrotoxic crisis or storm: Secondary | ICD-10-CM | POA: Diagnosis not present

## 2015-05-10 DIAGNOSIS — D351 Benign neoplasm of parathyroid gland: Secondary | ICD-10-CM | POA: Diagnosis not present

## 2015-05-17 DIAGNOSIS — Q892 Congenital malformations of other endocrine glands: Secondary | ICD-10-CM | POA: Diagnosis not present

## 2015-05-17 DIAGNOSIS — Z79899 Other long term (current) drug therapy: Secondary | ICD-10-CM | POA: Diagnosis not present

## 2015-05-17 DIAGNOSIS — E041 Nontoxic single thyroid nodule: Secondary | ICD-10-CM | POA: Diagnosis not present

## 2015-05-17 DIAGNOSIS — E079 Disorder of thyroid, unspecified: Secondary | ICD-10-CM | POA: Diagnosis not present

## 2015-05-17 DIAGNOSIS — E210 Primary hyperparathyroidism: Secondary | ICD-10-CM | POA: Diagnosis not present

## 2015-06-03 DIAGNOSIS — H6123 Impacted cerumen, bilateral: Secondary | ICD-10-CM | POA: Diagnosis not present

## 2015-06-03 DIAGNOSIS — H8110 Benign paroxysmal vertigo, unspecified ear: Secondary | ICD-10-CM | POA: Diagnosis not present

## 2015-06-05 DIAGNOSIS — R42 Dizziness and giddiness: Secondary | ICD-10-CM | POA: Diagnosis not present

## 2015-06-05 DIAGNOSIS — E042 Nontoxic multinodular goiter: Secondary | ICD-10-CM | POA: Diagnosis not present

## 2015-06-05 DIAGNOSIS — R569 Unspecified convulsions: Secondary | ICD-10-CM | POA: Diagnosis not present

## 2015-06-05 DIAGNOSIS — I10 Essential (primary) hypertension: Secondary | ICD-10-CM | POA: Diagnosis not present

## 2015-06-05 DIAGNOSIS — E210 Primary hyperparathyroidism: Secondary | ICD-10-CM | POA: Diagnosis not present

## 2015-06-05 DIAGNOSIS — G912 (Idiopathic) normal pressure hydrocephalus: Secondary | ICD-10-CM | POA: Diagnosis not present

## 2015-06-05 DIAGNOSIS — R413 Other amnesia: Secondary | ICD-10-CM | POA: Diagnosis not present

## 2015-06-05 DIAGNOSIS — R269 Unspecified abnormalities of gait and mobility: Secondary | ICD-10-CM | POA: Diagnosis not present

## 2015-06-12 DIAGNOSIS — E0590 Thyrotoxicosis, unspecified without thyrotoxic crisis or storm: Secondary | ICD-10-CM | POA: Diagnosis not present

## 2015-06-12 DIAGNOSIS — E042 Nontoxic multinodular goiter: Secondary | ICD-10-CM | POA: Diagnosis not present

## 2015-06-12 DIAGNOSIS — M810 Age-related osteoporosis without current pathological fracture: Secondary | ICD-10-CM | POA: Diagnosis not present

## 2015-06-12 DIAGNOSIS — I10 Essential (primary) hypertension: Secondary | ICD-10-CM | POA: Diagnosis not present

## 2015-06-12 DIAGNOSIS — Z90710 Acquired absence of both cervix and uterus: Secondary | ICD-10-CM | POA: Diagnosis not present

## 2015-06-12 DIAGNOSIS — M545 Low back pain: Secondary | ICD-10-CM | POA: Diagnosis not present

## 2015-06-12 DIAGNOSIS — Z79899 Other long term (current) drug therapy: Secondary | ICD-10-CM | POA: Diagnosis not present

## 2015-06-12 DIAGNOSIS — E210 Primary hyperparathyroidism: Secondary | ICD-10-CM | POA: Diagnosis not present

## 2015-06-12 DIAGNOSIS — K219 Gastro-esophageal reflux disease without esophagitis: Secondary | ICD-10-CM | POA: Diagnosis not present

## 2015-06-12 DIAGNOSIS — D351 Benign neoplasm of parathyroid gland: Secondary | ICD-10-CM | POA: Diagnosis not present

## 2015-06-13 DIAGNOSIS — I10 Essential (primary) hypertension: Secondary | ICD-10-CM | POA: Diagnosis not present

## 2015-06-13 DIAGNOSIS — E210 Primary hyperparathyroidism: Secondary | ICD-10-CM | POA: Diagnosis not present

## 2015-06-13 DIAGNOSIS — E042 Nontoxic multinodular goiter: Secondary | ICD-10-CM | POA: Diagnosis not present

## 2015-06-13 DIAGNOSIS — E0590 Thyrotoxicosis, unspecified without thyrotoxic crisis or storm: Secondary | ICD-10-CM | POA: Diagnosis not present

## 2015-06-13 DIAGNOSIS — M810 Age-related osteoporosis without current pathological fracture: Secondary | ICD-10-CM | POA: Diagnosis not present

## 2015-06-13 DIAGNOSIS — K219 Gastro-esophageal reflux disease without esophagitis: Secondary | ICD-10-CM | POA: Diagnosis not present

## 2015-06-21 DIAGNOSIS — E213 Hyperparathyroidism, unspecified: Secondary | ICD-10-CM | POA: Diagnosis not present

## 2015-07-10 ENCOUNTER — Encounter: Payer: Self-pay | Admitting: Family Medicine

## 2015-07-10 ENCOUNTER — Ambulatory Visit (INDEPENDENT_AMBULATORY_CARE_PROVIDER_SITE_OTHER): Payer: Medicare Other | Admitting: Family Medicine

## 2015-07-10 VITALS — BP 112/80 | HR 76 | Ht 61.0 in | Wt 127.0 lb

## 2015-07-10 DIAGNOSIS — M5442 Lumbago with sciatica, left side: Secondary | ICD-10-CM | POA: Diagnosis not present

## 2015-07-10 DIAGNOSIS — M9901 Segmental and somatic dysfunction of cervical region: Secondary | ICD-10-CM | POA: Diagnosis not present

## 2015-07-10 DIAGNOSIS — E559 Vitamin D deficiency, unspecified: Secondary | ICD-10-CM

## 2015-07-10 DIAGNOSIS — E042 Nontoxic multinodular goiter: Secondary | ICD-10-CM

## 2015-07-10 DIAGNOSIS — R609 Edema, unspecified: Secondary | ICD-10-CM | POA: Diagnosis not present

## 2015-07-10 DIAGNOSIS — K219 Gastro-esophageal reflux disease without esophagitis: Secondary | ICD-10-CM | POA: Diagnosis not present

## 2015-07-10 DIAGNOSIS — E892 Postprocedural hypoparathyroidism: Secondary | ICD-10-CM | POA: Insufficient documentation

## 2015-07-10 DIAGNOSIS — M129 Arthropathy, unspecified: Secondary | ICD-10-CM

## 2015-07-10 DIAGNOSIS — M530 Cervicocranial syndrome: Secondary | ICD-10-CM | POA: Diagnosis not present

## 2015-07-10 DIAGNOSIS — M9903 Segmental and somatic dysfunction of lumbar region: Secondary | ICD-10-CM | POA: Diagnosis not present

## 2015-07-10 DIAGNOSIS — I10 Essential (primary) hypertension: Secondary | ICD-10-CM | POA: Diagnosis not present

## 2015-07-10 DIAGNOSIS — M9902 Segmental and somatic dysfunction of thoracic region: Secondary | ICD-10-CM | POA: Diagnosis not present

## 2015-07-10 DIAGNOSIS — Z9009 Acquired absence of other part of head and neck: Secondary | ICD-10-CM | POA: Insufficient documentation

## 2015-07-10 DIAGNOSIS — Z9889 Other specified postprocedural states: Secondary | ICD-10-CM

## 2015-07-10 DIAGNOSIS — M1712 Unilateral primary osteoarthritis, left knee: Secondary | ICD-10-CM

## 2015-07-10 DIAGNOSIS — Z79899 Other long term (current) drug therapy: Secondary | ICD-10-CM | POA: Diagnosis not present

## 2015-07-10 DIAGNOSIS — M546 Pain in thoracic spine: Secondary | ICD-10-CM | POA: Diagnosis not present

## 2015-07-10 MED ORDER — CALCIUM CARBONATE ANTACID 1000 MG PO CHEW: 1000.0000 mg | CHEWABLE_TABLET | Freq: Every day | ORAL | Status: DC

## 2015-07-10 MED ORDER — RANITIDINE HCL 150 MG PO TABS: 150.0000 mg | ORAL_TABLET | Freq: Two times a day (BID) | ORAL | Status: DC | PRN

## 2015-07-10 NOTE — Progress Notes (Signed)
Date:  07/10/2015   Name:  Danielle Herrera   DOB:  06/21/1933   MRN:  [id number]  PCP:  Adline Potter, MD    Chief Complaint: Hypertension   History of Present Illness:  This is a 82 y.o. female seen in five month f/u. Had parathyroidectomy last month which went well, told by surgeon to restart calcium 1000 mg daily. Concerned that BP is occasionally high. Pedal edema stable. Off Lipitor with good TC/HDL ratio in Jan. Off Nexium, using Zantac prn only for occ dyspepsia. Off Celebrex on Tylenol 1000 mg bid with good control OA sxs. Having to cut mvit in half to swallow, surgeon told ok. Taking vit D supplement. Never stopped Tylenol PM or asa. Review of Systems:  Review of Systems  Constitutional: Negative for fever and fatigue.  Respiratory: Negative for cough and shortness of breath.   Cardiovascular: Negative for chest pain.  Endocrine: Negative for polyuria.  Genitourinary: Negative for difficulty urinating.  Neurological: Negative for syncope and light-headedness.    Patient Active Problem List   Diagnosis Date Noted  . Polypharmacy 02/19/2015  . 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 Calcium blood increased 08/13/2014  . HLD (hyperlipidemia) 08/13/2014  . Vitamin D deficiency 08/13/2014  . Ventricular shunt in place 08/13/2014  . Esophageal reflux 08/13/2014  . Hyperparathyroidism, primary ([place]) 08/02/2013  . 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 Abnormal gait 08/26/2012  . Goiter, nontoxic, multinodular 05/23/2012  . Acoustic neuroma ([place]) 01/20/2010  . Colon polyp 01/20/2010  . Dependent edema 01/20/2010  . Essential (primary) hypertension 01/20/2010  . Arthritis of knee, left 01/20/2010  . Osteopenia 01/20/2010    Prior to Admission medications   Medication Sig Start Date End Date Taking? Authorizing Provider  acetaminophen (TYLENOL) 500 MG tablet Take 2 tablets (1,000 mg total) by mouth 2 (two) times daily. 02/19/15  Yes Adline Potter, MD  amLODipine (NORVASC) 10 MG tablet  Take 1 tablet (10 mg total) by mouth daily. 12/31/14  Yes Adline Potter, MD  calcium carbonate (TUMS - DOSED IN MG ELEMENTAL CALCIUM) 500 MG chewable tablet Chew 2 tablets by mouth daily.   Yes Historical Provider, MD  Carboxymethylcellul-Glycerin (OPTIVE) 0.5-0.9 % SOLN 1 drop daily as needed. 11/18/11  Yes Historical Provider, MD  Cholecalciferol (VITAMIN D) 2000 UNITS CAPS Take 1 capsule (2,000 Units total) by mouth daily. 08/13/14  Yes Adline Potter, MD  furosemide (LASIX) 20 MG tablet Take 20 mg by mouth. 04/24/14  Yes Historical Provider, MD  lisinopril (PRINIVIL,ZESTRIL) 10 MG tablet Take 1 tablet (10 mg total) by mouth daily. 03/05/15 03/04/16 Yes Adline Potter, MD  methimazole (TAPAZOLE) 5 MG tablet Take 1 tablet (5 mg total) by mouth daily. 03/07/15 03/06/16 Yes Adline Potter, MD  Multiple Vitamins-Minerals (CENTRUM SILVER ULTRA WOMENS) TABS Take by mouth. 11/18/11  Yes Historical Provider, MD  sennosides-docusate sodium (SENOKOT-S) 8.6-50 MG tablet Take by mouth. 09/18/13  Yes Historical Provider, MD  ranitidine (ZANTAC) 150 MG tablet Take 1 tablet (150 mg total) by mouth 2 (two) times daily as needed for heartburn. 07/10/15   Adline Potter, MD    No Known Allergies  Past Surgical History  Procedure Laterality Date  . Appendectomy    . Acoustic neuroma resection Right 1985  . Hydrcephalic Right 123456  . Breast surgery    . Colon surgery    . Shoulder arthroscopy w/ acromial repair Right 61yrs ago  . Tubal ligation    . Parathyroidectomy      partial removal of parathyroid gland  Social History  Substance Use Topics  . Smoking status: Never Smoker   . Smokeless tobacco: Never Used  . Alcohol Use: 0.6 oz/week    1 Standard drinks or equivalent per week    Family History  Problem Relation Age of Onset  . Hypertension Mother   . Mental illness Mother   . Heart disease Father   . Hypertension Father   . Diabetes Sister   . Stroke Brother     Medication list has been reviewed and  updated.  Physical Examination: BP 112/80 mmHg  Pulse 76  Ht 5\' 1"  (1.549 m)  Wt 127 lb (57.607 kg)  BMI 24.01 kg/m2  Physical Exam  Constitutional: She appears well-developed and well-nourished.  Cardiovascular: Normal rate, regular rhythm and normal heart sounds.   Pulmonary/Chest: Effort normal and breath sounds normal.  Musculoskeletal:  !+ BLE edema  Neurological: She is alert.  Skin: Skin is warm and dry.  Psychiatric: She has a normal mood and affect. Her behavior is normal.  Nursing note and vitals reviewed.   Assessment and Plan:  1. Essential (primary) hypertension Home readings reviewed, well controlled on average, cont lisinopril/Lasix/amlodipine  2. Gastroesophageal reflux disease, esophagitis presence not specified Off Nexium, well controlled on prn Zantac  3. Goiter, nontoxic, multinodular On methimazole, followed by endo  4. S/P parathyroidectomy Doing well  5. Arthritis of knee, left Off Celebrex, well controlled on Tylenol bid  6. Dependent edema Stable on Lasix  7. Vitamin D deficiency On supplement  8. Polypharmacy Recommend d/c Tylenol PM given cognitive/fall risks and asa as no clear indication for use.  Return in about 6 months (around 01/10/2016).  Satira Anis. [facility]  07/10/2015

## 2015-08-14 DIAGNOSIS — M5442 Lumbago with sciatica, left side: Secondary | ICD-10-CM | POA: Diagnosis not present

## 2015-08-14 DIAGNOSIS — M9902 Segmental and somatic dysfunction of thoracic region: Secondary | ICD-10-CM | POA: Diagnosis not present

## 2015-08-14 DIAGNOSIS — M9903 Segmental and somatic dysfunction of lumbar region: Secondary | ICD-10-CM | POA: Diagnosis not present

## 2015-08-14 DIAGNOSIS — M530 Cervicocranial syndrome: Secondary | ICD-10-CM | POA: Diagnosis not present

## 2015-08-14 DIAGNOSIS — M9901 Segmental and somatic dysfunction of cervical region: Secondary | ICD-10-CM | POA: Diagnosis not present

## 2015-08-14 DIAGNOSIS — M546 Pain in thoracic spine: Secondary | ICD-10-CM | POA: Diagnosis not present

## 2015-08-20 ENCOUNTER — Ambulatory Visit: Payer: Medicare Other | Admitting: Family Medicine

## 2015-08-29 ENCOUNTER — Telehealth: Payer: Self-pay

## 2015-08-29 DIAGNOSIS — M25559 Pain in unspecified hip: Secondary | ICD-10-CM

## 2015-08-29 NOTE — Telephone Encounter (Signed)
Sent to [facility] 

## 2015-08-29 NOTE — Addendum Note (Signed)
Addended by: Adline Potter on: 08/29/2015 04:43 PM   Modules accepted: Orders

## 2015-08-29 NOTE — Telephone Encounter (Signed)
Done

## 2015-09-02 DIAGNOSIS — M25551 Pain in right hip: Secondary | ICD-10-CM | POA: Diagnosis not present

## 2015-09-02 DIAGNOSIS — M7061 Trochanteric bursitis, right hip: Secondary | ICD-10-CM | POA: Diagnosis not present

## 2015-09-04 ENCOUNTER — Other Ambulatory Visit: Payer: Self-pay

## 2015-09-04 MED ORDER — FUROSEMIDE 20 MG PO TABS: 20.0000 mg | ORAL_TABLET | Freq: Every day | ORAL | Status: DC

## 2015-09-05 DIAGNOSIS — D3132 Benign neoplasm of left choroid: Secondary | ICD-10-CM | POA: Diagnosis not present

## 2015-09-05 DIAGNOSIS — H2513 Age-related nuclear cataract, bilateral: Secondary | ICD-10-CM | POA: Diagnosis not present

## 2015-09-05 DIAGNOSIS — H35363 Drusen (degenerative) of macula, bilateral: Secondary | ICD-10-CM | POA: Diagnosis not present

## 2015-09-06 ENCOUNTER — Other Ambulatory Visit: Payer: Self-pay

## 2015-09-11 DIAGNOSIS — M530 Cervicocranial syndrome: Secondary | ICD-10-CM | POA: Diagnosis not present

## 2015-09-11 DIAGNOSIS — M9901 Segmental and somatic dysfunction of cervical region: Secondary | ICD-10-CM | POA: Diagnosis not present

## 2015-09-11 DIAGNOSIS — M9903 Segmental and somatic dysfunction of lumbar region: Secondary | ICD-10-CM | POA: Diagnosis not present

## 2015-09-11 DIAGNOSIS — M5442 Lumbago with sciatica, left side: Secondary | ICD-10-CM | POA: Diagnosis not present

## 2015-09-11 DIAGNOSIS — M9902 Segmental and somatic dysfunction of thoracic region: Secondary | ICD-10-CM | POA: Diagnosis not present

## 2015-09-11 DIAGNOSIS — M546 Pain in thoracic spine: Secondary | ICD-10-CM | POA: Diagnosis not present

## 2015-09-25 DIAGNOSIS — H2513 Age-related nuclear cataract, bilateral: Secondary | ICD-10-CM | POA: Diagnosis not present

## 2015-10-02 DIAGNOSIS — M5442 Lumbago with sciatica, left side: Secondary | ICD-10-CM | POA: Diagnosis not present

## 2015-10-02 DIAGNOSIS — M546 Pain in thoracic spine: Secondary | ICD-10-CM | POA: Diagnosis not present

## 2015-10-02 DIAGNOSIS — M530 Cervicocranial syndrome: Secondary | ICD-10-CM | POA: Diagnosis not present

## 2015-10-02 DIAGNOSIS — M9901 Segmental and somatic dysfunction of cervical region: Secondary | ICD-10-CM | POA: Diagnosis not present

## 2015-10-02 DIAGNOSIS — M9903 Segmental and somatic dysfunction of lumbar region: Secondary | ICD-10-CM | POA: Diagnosis not present

## 2015-10-02 DIAGNOSIS — M9902 Segmental and somatic dysfunction of thoracic region: Secondary | ICD-10-CM | POA: Diagnosis not present

## 2015-10-15 DIAGNOSIS — H2513 Age-related nuclear cataract, bilateral: Secondary | ICD-10-CM | POA: Diagnosis not present

## 2015-10-15 DIAGNOSIS — H2511 Age-related nuclear cataract, right eye: Secondary | ICD-10-CM | POA: Diagnosis not present

## 2015-10-15 DIAGNOSIS — I10 Essential (primary) hypertension: Secondary | ICD-10-CM | POA: Diagnosis not present

## 2015-10-15 DIAGNOSIS — Z7982 Long term (current) use of aspirin: Secondary | ICD-10-CM | POA: Diagnosis not present

## 2015-10-15 DIAGNOSIS — Z79899 Other long term (current) drug therapy: Secondary | ICD-10-CM | POA: Diagnosis not present

## 2015-10-15 DIAGNOSIS — E049 Nontoxic goiter, unspecified: Secondary | ICD-10-CM | POA: Diagnosis not present

## 2015-11-06 DIAGNOSIS — M9903 Segmental and somatic dysfunction of lumbar region: Secondary | ICD-10-CM | POA: Diagnosis not present

## 2015-11-06 DIAGNOSIS — M530 Cervicocranial syndrome: Secondary | ICD-10-CM | POA: Diagnosis not present

## 2015-11-06 DIAGNOSIS — M9902 Segmental and somatic dysfunction of thoracic region: Secondary | ICD-10-CM | POA: Diagnosis not present

## 2015-11-06 DIAGNOSIS — M9901 Segmental and somatic dysfunction of cervical region: Secondary | ICD-10-CM | POA: Diagnosis not present

## 2015-11-06 DIAGNOSIS — M546 Pain in thoracic spine: Secondary | ICD-10-CM | POA: Diagnosis not present

## 2015-11-06 DIAGNOSIS — M5442 Lumbago with sciatica, left side: Secondary | ICD-10-CM | POA: Diagnosis not present

## 2015-11-07 DIAGNOSIS — M7061 Trochanteric bursitis, right hip: Secondary | ICD-10-CM | POA: Diagnosis not present

## 2015-11-11 DIAGNOSIS — H04123 Dry eye syndrome of bilateral lacrimal glands: Secondary | ICD-10-CM | POA: Diagnosis not present

## 2015-11-11 DIAGNOSIS — Z961 Presence of intraocular lens: Secondary | ICD-10-CM | POA: Diagnosis not present

## 2015-11-25 DIAGNOSIS — M7061 Trochanteric bursitis, right hip: Secondary | ICD-10-CM | POA: Diagnosis not present

## 2015-11-25 DIAGNOSIS — M25551 Pain in right hip: Secondary | ICD-10-CM | POA: Diagnosis not present

## 2015-11-25 DIAGNOSIS — M6281 Muscle weakness (generalized): Secondary | ICD-10-CM | POA: Diagnosis not present

## 2015-11-28 DIAGNOSIS — M25551 Pain in right hip: Secondary | ICD-10-CM | POA: Diagnosis not present

## 2015-11-28 DIAGNOSIS — M6281 Muscle weakness (generalized): Secondary | ICD-10-CM | POA: Diagnosis not present

## 2015-11-28 DIAGNOSIS — M7061 Trochanteric bursitis, right hip: Secondary | ICD-10-CM | POA: Diagnosis not present

## 2015-12-02 ENCOUNTER — Other Ambulatory Visit: Payer: Self-pay | Admitting: Internal Medicine

## 2015-12-02 MED ORDER — AMLODIPINE BESYLATE 10 MG PO TABS: 10.0000 mg | ORAL_TABLET | Freq: Every day | ORAL | 0 refills | Status: DC

## 2015-12-03 DIAGNOSIS — M6281 Muscle weakness (generalized): Secondary | ICD-10-CM | POA: Diagnosis not present

## 2015-12-03 DIAGNOSIS — M25551 Pain in right hip: Secondary | ICD-10-CM | POA: Diagnosis not present

## 2015-12-03 DIAGNOSIS — M7061 Trochanteric bursitis, right hip: Secondary | ICD-10-CM | POA: Diagnosis not present

## 2015-12-04 DIAGNOSIS — M9901 Segmental and somatic dysfunction of cervical region: Secondary | ICD-10-CM | POA: Diagnosis not present

## 2015-12-04 DIAGNOSIS — M9903 Segmental and somatic dysfunction of lumbar region: Secondary | ICD-10-CM | POA: Diagnosis not present

## 2015-12-04 DIAGNOSIS — M530 Cervicocranial syndrome: Secondary | ICD-10-CM | POA: Diagnosis not present

## 2015-12-04 DIAGNOSIS — M546 Pain in thoracic spine: Secondary | ICD-10-CM | POA: Diagnosis not present

## 2015-12-04 DIAGNOSIS — M5442 Lumbago with sciatica, left side: Secondary | ICD-10-CM | POA: Diagnosis not present

## 2015-12-04 DIAGNOSIS — M9902 Segmental and somatic dysfunction of thoracic region: Secondary | ICD-10-CM | POA: Diagnosis not present

## 2015-12-05 DIAGNOSIS — M6281 Muscle weakness (generalized): Secondary | ICD-10-CM | POA: Diagnosis not present

## 2015-12-05 DIAGNOSIS — M25551 Pain in right hip: Secondary | ICD-10-CM | POA: Diagnosis not present

## 2015-12-05 DIAGNOSIS — M7061 Trochanteric bursitis, right hip: Secondary | ICD-10-CM | POA: Diagnosis not present

## 2015-12-10 DIAGNOSIS — M7061 Trochanteric bursitis, right hip: Secondary | ICD-10-CM | POA: Diagnosis not present

## 2015-12-10 DIAGNOSIS — M25551 Pain in right hip: Secondary | ICD-10-CM | POA: Diagnosis not present

## 2015-12-12 DIAGNOSIS — M7061 Trochanteric bursitis, right hip: Secondary | ICD-10-CM | POA: Diagnosis not present

## 2015-12-12 DIAGNOSIS — M6281 Muscle weakness (generalized): Secondary | ICD-10-CM | POA: Diagnosis not present

## 2015-12-12 DIAGNOSIS — M25551 Pain in right hip: Secondary | ICD-10-CM | POA: Diagnosis not present

## 2015-12-12 DIAGNOSIS — H2512 Age-related nuclear cataract, left eye: Secondary | ICD-10-CM | POA: Diagnosis not present

## 2015-12-12 DIAGNOSIS — Z961 Presence of intraocular lens: Secondary | ICD-10-CM | POA: Insufficient documentation

## 2015-12-12 DIAGNOSIS — H04123 Dry eye syndrome of bilateral lacrimal glands: Secondary | ICD-10-CM | POA: Diagnosis not present

## 2015-12-16 DIAGNOSIS — M25551 Pain in right hip: Secondary | ICD-10-CM | POA: Diagnosis not present

## 2015-12-16 DIAGNOSIS — M6281 Muscle weakness (generalized): Secondary | ICD-10-CM | POA: Diagnosis not present

## 2015-12-16 DIAGNOSIS — M7061 Trochanteric bursitis, right hip: Secondary | ICD-10-CM | POA: Diagnosis not present

## 2015-12-18 DIAGNOSIS — M7061 Trochanteric bursitis, right hip: Secondary | ICD-10-CM | POA: Diagnosis not present

## 2015-12-18 DIAGNOSIS — M25551 Pain in right hip: Secondary | ICD-10-CM | POA: Diagnosis not present

## 2015-12-18 DIAGNOSIS — M6281 Muscle weakness (generalized): Secondary | ICD-10-CM | POA: Diagnosis not present

## 2015-12-24 DIAGNOSIS — M7061 Trochanteric bursitis, right hip: Secondary | ICD-10-CM | POA: Diagnosis not present

## 2015-12-26 DIAGNOSIS — M6281 Muscle weakness (generalized): Secondary | ICD-10-CM | POA: Diagnosis not present

## 2015-12-26 DIAGNOSIS — M25551 Pain in right hip: Secondary | ICD-10-CM | POA: Diagnosis not present

## 2015-12-26 DIAGNOSIS — M7061 Trochanteric bursitis, right hip: Secondary | ICD-10-CM | POA: Diagnosis not present

## 2015-12-30 DIAGNOSIS — M6281 Muscle weakness (generalized): Secondary | ICD-10-CM | POA: Diagnosis not present

## 2015-12-30 DIAGNOSIS — M25551 Pain in right hip: Secondary | ICD-10-CM | POA: Diagnosis not present

## 2015-12-30 DIAGNOSIS — M7061 Trochanteric bursitis, right hip: Secondary | ICD-10-CM | POA: Diagnosis not present

## 2015-12-31 DIAGNOSIS — H8112 Benign paroxysmal vertigo, left ear: Secondary | ICD-10-CM | POA: Diagnosis not present

## 2016-01-01 DIAGNOSIS — M5442 Lumbago with sciatica, left side: Secondary | ICD-10-CM | POA: Diagnosis not present

## 2016-01-01 DIAGNOSIS — M9901 Segmental and somatic dysfunction of cervical region: Secondary | ICD-10-CM | POA: Diagnosis not present

## 2016-01-01 DIAGNOSIS — M530 Cervicocranial syndrome: Secondary | ICD-10-CM | POA: Diagnosis not present

## 2016-01-01 DIAGNOSIS — M9902 Segmental and somatic dysfunction of thoracic region: Secondary | ICD-10-CM | POA: Diagnosis not present

## 2016-01-01 DIAGNOSIS — M9903 Segmental and somatic dysfunction of lumbar region: Secondary | ICD-10-CM | POA: Diagnosis not present

## 2016-01-01 DIAGNOSIS — M546 Pain in thoracic spine: Secondary | ICD-10-CM | POA: Diagnosis not present

## 2016-01-02 DIAGNOSIS — M7061 Trochanteric bursitis, right hip: Secondary | ICD-10-CM | POA: Diagnosis not present

## 2016-01-10 ENCOUNTER — Ambulatory Visit: Payer: Medicare Other | Admitting: Family Medicine

## 2016-01-14 DIAGNOSIS — M8589 Other specified disorders of bone density and structure, multiple sites: Secondary | ICD-10-CM | POA: Diagnosis not present

## 2016-01-14 DIAGNOSIS — Z8639 Personal history of other endocrine, nutritional and metabolic disease: Secondary | ICD-10-CM | POA: Diagnosis not present

## 2016-01-14 DIAGNOSIS — Z79899 Other long term (current) drug therapy: Secondary | ICD-10-CM | POA: Diagnosis not present

## 2016-01-14 DIAGNOSIS — E0520 Thyrotoxicosis with toxic multinodular goiter without thyrotoxic crisis or storm: Secondary | ICD-10-CM | POA: Diagnosis not present

## 2016-01-15 ENCOUNTER — Ambulatory Visit (INDEPENDENT_AMBULATORY_CARE_PROVIDER_SITE_OTHER): Payer: Medicare Other | Admitting: Internal Medicine

## 2016-01-15 ENCOUNTER — Encounter: Payer: Self-pay | Admitting: Internal Medicine

## 2016-01-15 VITALS — BP 126/76 | HR 90 | Temp 97.8°F | Resp 16 | Ht 61.0 in | Wt 127.0 lb

## 2016-01-15 DIAGNOSIS — R945 Abnormal results of liver function studies: Principal | ICD-10-CM

## 2016-01-15 DIAGNOSIS — K759 Inflammatory liver disease, unspecified: Secondary | ICD-10-CM

## 2016-01-15 DIAGNOSIS — R7989 Other specified abnormal findings of blood chemistry: Secondary | ICD-10-CM

## 2016-01-15 NOTE — Progress Notes (Signed)
Date:  01/15/2016   Name:  Danielle Herrera   DOB:  [birth year]   MRN:  [id number]   Chief Complaint: Elevated Hepatic Enzymes (Was told by [facility] to make appointment ASAP) and Cough (Went to [place] and got sick on way back. Cough and cold stuff. ) Patient had routine labs done at [facility] yesterday.  Her AST, ALT and ALK phos were elevated x3.  Bilirubin was normal. She states that she feels well but was told to have this followed up immediately.  She denies abdominal pain, fever, jaundice, change in bowel habits or exposure to blood products or other sick individuals. She recently returned from [place].  Did not consume unusual foods.  She reports having "yellow jaundice" as a teenager - multiple members of the community were also affected and she was told never to donate blood. She takes only the prescriptions on her medication list.  She does not take any other over the counter or herbal supplements.  She has 1-2 alcoholic beverages per week at the most.  No recent increase in alcohol use.  Hepatic Function Panel (HFP) (01/14/2016 12:34 PM) Hepatic Function Panel (HFP) (01/14/2016 12:34 PM)  Component Value Ref Range  Protein, Total 7.6 5.8 - 7.8 g/dL  Albumin 3.6 3.5 - 4.8 g/dL  Bilirubin, Total 0.6 0.4 - 1.5 mg/dL  Bilirubin, Conjugated 0.1 0.1 - 0.6 mg/dL  Alk Phos (Alkaline Phosphatase) 179 (H) 24 - 110 U/L  AST (Aspartate Aminotransferase) 128 (H) 15 - 41 U/L  ALT (Alanine Aminotransferase) 118 (H) 14 - 54 U/L     Review of Systems  Constitutional: Negative for chills, fatigue, fever and unexpected weight change.  Eyes: Negative for visual disturbance.  Respiratory: Negative for chest tightness and shortness of breath.   Cardiovascular: Negative for chest pain, palpitations and leg swelling.  Gastrointestinal: Negative for abdominal pain, blood in stool, constipation, diarrhea, nausea and vomiting.  Genitourinary: Negative for decreased urine volume and dysuria.    Musculoskeletal: Positive for arthralgias (stable chronic OA).  Skin: Negative for color change and rash.  Neurological: Negative for tremors, weakness and headaches.  Hematological: Negative for adenopathy.    Patient Active Problem List   Diagnosis Date Noted  . Pseudophakia of right eye 12/12/2015  . S/P parathyroidectomy ([place]) 07/10/2015  . Polypharmacy 02/19/2015  . 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 HLD (hyperlipidemia) 08/13/2014  . Vitamin D deficiency 08/13/2014  . Ventricular shunt in place 08/13/2014  . Esophageal reflux 08/13/2014  . 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 Abnormal gait 08/26/2012  . Goiter, nontoxic, multinodular 05/23/2012  . Acoustic neuroma ([place]) 01/20/2010  . Colon polyp 01/20/2010  . Dependent edema 01/20/2010  . Essential (primary) hypertension 01/20/2010  . Arthritis of knee, left 01/20/2010  . Osteopenia 01/20/2010    Prior to Admission medications   Medication Sig Start Date End Date Taking? Authorizing Provider  acetaminophen (TYLENOL) 500 MG tablet Take 2 tablets (1,000 mg total) by mouth 2 (two) times daily. 02/19/15  Yes Adline Potter, MD  amLODipine (NORVASC) 10 MG tablet Take 1 tablet (10 mg total) by mouth daily. 12/02/15  Yes Adline Potter, MD  calcium carbonate (TUMS - DOSED IN MG ELEMENTAL CALCIUM) 500 MG chewable tablet Chew 2 tablets by mouth daily.   Yes Historical Provider, MD  Carboxymethylcellul-Glycerin (OPTIVE) 0.5-0.9 % SOLN 1 drop daily as needed. 11/18/11  Yes Historical Provider, MD  celecoxib (CELEBREX) 200 MG capsule Take by mouth. 11/07/15  Yes Historical Provider, MD  Cholecalciferol (VITAMIN D) 2000  UNITS CAPS Take 1 capsule (2,000 Units total) by mouth daily. 08/13/14  Yes Adline Potter, MD  furosemide (LASIX) 20 MG tablet Take 1 tablet (20 mg total) by mouth daily. 09/04/15  Yes Adline Potter, MD  lisinopril (PRINIVIL,ZESTRIL) 10 MG tablet Take 1 tablet (10 mg total) by mouth daily. 03/05/15 03/04/16 Yes Adline Potter, MD   methimazole (TAPAZOLE) 5 MG tablet Take 1 tablet (5 mg total) by mouth daily. 03/07/15 03/06/16 Yes Adline Potter, MD  Multiple Vitamins-Minerals (CENTRUM SILVER ULTRA WOMENS) TABS Take by mouth. 11/18/11  Yes Historical Provider, MD  ranitidine (ZANTAC) 150 MG tablet Take 1 tablet (150 mg total) by mouth 2 (two) times daily as needed for heartburn. 07/10/15  Yes Adline Potter, MD  sennosides-docusate sodium (SENOKOT-S) 8.6-50 MG tablet Take by mouth. 09/18/13  Yes Historical Provider, MD    No Known Allergies  Past Surgical History:  Procedure Laterality Date  . ACOUSTIC NEUROMA RESECTION Right 1985  . APPENDECTOMY    . BREAST SURGERY    . COLON SURGERY    . hydrcephalic Right 6226  . PARATHYROIDECTOMY     partial removal of parathyroid gland  . SHOULDER ARTHROSCOPY W/ ACROMIAL REPAIR Right 74yr ago  . TUBAL LIGATION      Social History  Substance Use Topics  . Smoking status: Never Smoker  . Smokeless tobacco: Never Used  . Alcohol use 0.6 oz/week    1 Standard drinks or equivalent per week     Medication list has been reviewed and updated.   Physical Exam  Constitutional: She is oriented to person, place, and time. She appears well-developed. No distress.  HENT:  Head: Normocephalic and atraumatic.  Eyes: Pupils are equal, round, and reactive to light.  Neck: Normal range of motion. Neck supple. Carotid bruit is not present. No thyromegaly present.  Cardiovascular: Normal rate, regular rhythm and normal heart sounds.   Pulmonary/Chest: Effort normal and breath sounds normal. No respiratory distress. She has no wheezes.  Abdominal: Normal appearance and bowel sounds are normal. There is no hepatosplenomegaly. There is no tenderness. There is no rebound and no CVA tenderness.  Musculoskeletal: Normal range of motion.  Neurological: She is alert and oriented to person, place, and time. She has normal reflexes. She displays no tremor. Gait normal.  Skin: Skin is warm and dry. No  rash noted.  Psychiatric: She has a normal mood and affect. Her behavior is normal. Thought content normal.  Nursing note and vitals reviewed.   BP 126/76   Pulse 90   Temp 97.8 F (36.6 C)   Resp 16   Ht _0  (1.549 m)   Wt 127 lb (57.6 kg)   SpO2 98%   BMI 24.00 kg/m   Assessment and Plan: 1. Elevated liver function tests Mild elevation with no obvious cause per history Obtain labs and consider [place] rule out NAFLD  2. Hepatitis - Hepatitis, Acute   LHalina Maidens MD [email or web address]  01/15/2016

## 2016-01-16 ENCOUNTER — Other Ambulatory Visit: Payer: Self-pay | Admitting: Internal Medicine

## 2016-01-16 DIAGNOSIS — M7061 Trochanteric bursitis, right hip: Secondary | ICD-10-CM | POA: Diagnosis not present

## 2016-01-16 DIAGNOSIS — R748 Abnormal levels of other serum enzymes: Secondary | ICD-10-CM

## 2016-01-16 DIAGNOSIS — M6281 Muscle weakness (generalized): Secondary | ICD-10-CM | POA: Diagnosis not present

## 2016-01-16 DIAGNOSIS — M25551 Pain in right hip: Secondary | ICD-10-CM | POA: Diagnosis not present

## 2016-01-16 LAB — HEPATITIS PANEL, ACUTE
HEP A IGM: NEGATIVE
HEP B C IGM: NEGATIVE
HEP B S AG: NEGATIVE

## 2016-01-17 ENCOUNTER — Telehealth: Payer: Self-pay

## 2016-01-17 NOTE — Telephone Encounter (Signed)
Patient said she got her results and they say return Monday for more testing but her doctor at [facility] wants copy of these last labs for elevated liver enzymes so she said please send to [facility] and she will have fax number when we call her back. Is it ok to do this or do I need her to sign for these records to be sent?

## 2016-01-17 NOTE — Telephone Encounter (Signed)
Patient informed that [facility] can see her test results through [facility].

## 2016-01-20 ENCOUNTER — Other Ambulatory Visit: Payer: Self-pay

## 2016-01-20 ENCOUNTER — Ambulatory Visit: Payer: Medicare Other | Admitting: Internal Medicine

## 2016-01-20 DIAGNOSIS — R748 Abnormal levels of other serum enzymes: Secondary | ICD-10-CM

## 2016-01-21 DIAGNOSIS — R748 Abnormal levels of other serum enzymes: Secondary | ICD-10-CM | POA: Diagnosis not present

## 2016-01-22 LAB — HEPATIC FUNCTION PANEL
ALT: 30 IU/L (ref 0–32)
AST: 21 IU/L (ref 0–40)
Albumin: 4.2 g/dL (ref 3.5–4.7)
Alkaline Phosphatase: 133 IU/L — ABNORMAL HIGH (ref 39–117)
BILIRUBIN TOTAL: 0.2 mg/dL (ref 0.0–1.2)
BILIRUBIN, DIRECT: 0.09 mg/dL (ref 0.00–0.40)
Total Protein: 6.8 g/dL (ref 6.0–8.5)

## 2016-01-31 DIAGNOSIS — H6122 Impacted cerumen, left ear: Secondary | ICD-10-CM | POA: Diagnosis not present

## 2016-02-05 DIAGNOSIS — M9903 Segmental and somatic dysfunction of lumbar region: Secondary | ICD-10-CM | POA: Diagnosis not present

## 2016-02-05 DIAGNOSIS — M530 Cervicocranial syndrome: Secondary | ICD-10-CM | POA: Diagnosis not present

## 2016-02-05 DIAGNOSIS — M5442 Lumbago with sciatica, left side: Secondary | ICD-10-CM | POA: Diagnosis not present

## 2016-02-05 DIAGNOSIS — M9901 Segmental and somatic dysfunction of cervical region: Secondary | ICD-10-CM | POA: Diagnosis not present

## 2016-02-05 DIAGNOSIS — M546 Pain in thoracic spine: Secondary | ICD-10-CM | POA: Diagnosis not present

## 2016-02-05 DIAGNOSIS — M9902 Segmental and somatic dysfunction of thoracic region: Secondary | ICD-10-CM | POA: Diagnosis not present

## 2016-02-06 ENCOUNTER — Ambulatory Visit (INDEPENDENT_AMBULATORY_CARE_PROVIDER_SITE_OTHER): Payer: Medicare Other | Admitting: Family Medicine

## 2016-02-06 ENCOUNTER — Encounter: Payer: Self-pay | Admitting: Family Medicine

## 2016-02-06 VITALS — BP 124/84 | HR 92 | Resp 16 | Ht 61.0 in | Wt 128.0 lb

## 2016-02-06 DIAGNOSIS — R609 Edema, unspecified: Secondary | ICD-10-CM

## 2016-02-06 DIAGNOSIS — K219 Gastro-esophageal reflux disease without esophagitis: Secondary | ICD-10-CM | POA: Diagnosis not present

## 2016-02-06 DIAGNOSIS — E559 Vitamin D deficiency, unspecified: Secondary | ICD-10-CM

## 2016-02-06 DIAGNOSIS — I10 Essential (primary) hypertension: Secondary | ICD-10-CM | POA: Diagnosis not present

## 2016-02-06 DIAGNOSIS — E042 Nontoxic multinodular goiter: Secondary | ICD-10-CM

## 2016-02-06 MED ORDER — ACETAMINOPHEN 500 MG PO TABS: 1000.0000 mg | ORAL_TABLET | Freq: Three times a day (TID) | ORAL | 0 refills | Status: DC | PRN

## 2016-02-06 NOTE — Progress Notes (Signed)
Date:  02/06/2016   Name:  Danielle Herrera   DOB:  Jan 07, 1934   MRN:  [id number]  PCP:  Adline Potter, MD    Chief Complaint: Hypertension   History of Present Illness:  This is a 82 y.o. female seen for 7 month f/u. Had trocanteric bursitis, placed on Celebrex, had LFT elevation, resolved off Celebrex. Placed back on calcium citrate by endo, multinodular goiter stable. Jerrye Bushy ok on prn Zantac, edema stable on Lasix, off Tylenol PM and asa.  Review of Systems:  Review of Systems  Constitutional: Negative for appetite change and fever.  Respiratory: Negative for shortness of breath.   Cardiovascular: Negative for chest pain.  Gastrointestinal: Negative for abdominal pain.  Genitourinary: Negative for difficulty urinating.  Neurological: Negative for dizziness and syncope.    Patient Active Problem List   Diagnosis Date Noted  . Pseudophakia of right eye 12/12/2015  . S/P parathyroidectomy ([place]) 07/10/2015  . Polypharmacy 02/19/2015  . 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 HLD (hyperlipidemia) 08/13/2014  . Vitamin D deficiency 08/13/2014  . Ventricular shunt in place 08/13/2014  . Esophageal reflux 08/13/2014  . 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 Abnormal gait 08/26/2012  . Goiter, nontoxic, multinodular 05/23/2012  . Acoustic neuroma ([place]) 01/20/2010  . Colon polyp 01/20/2010  . Dependent edema 01/20/2010  . Essential (primary) hypertension 01/20/2010  . Arthritis of knee, left 01/20/2010  . Osteopenia 01/20/2010    Prior to Admission medications   Medication Sig Start Date End Date Taking? Authorizing Provider  acetaminophen (TYLENOL) 500 MG tablet Take 2 tablets (1,000 mg total) by mouth every 8 (eight) hours as needed. 02/06/16  Yes Adline Potter, MD  amLODipine (NORVASC) 10 MG tablet Take 1 tablet (10 mg total) by mouth daily. 12/02/15  Yes Adline Potter, MD  Calcium Citrate-Vitamin D 315-250 MG-UNIT TABS Take 1 tablet by mouth 2 (two) times daily.   Yes Historical Provider,  MD  Carboxymethylcellul-Glycerin (OPTIVE) 0.5-0.9 % SOLN 1 drop daily as needed. 11/18/11  Yes Historical Provider, MD  Cholecalciferol (VITAMIN D) 2000 UNITS CAPS Take 1 capsule (2,000 Units total) by mouth daily. 08/13/14  Yes Adline Potter, MD  furosemide (LASIX) 20 MG tablet Take 1 tablet (20 mg total) by mouth daily. 09/04/15  Yes Adline Potter, MD  lisinopril (PRINIVIL,ZESTRIL) 10 MG tablet Take 1 tablet (10 mg total) by mouth daily. 03/05/15 03/04/16 Yes Adline Potter, MD  methimazole (TAPAZOLE) 5 MG tablet Take 1 tablet (5 mg total) by mouth daily. 03/07/15 03/06/16 Yes Adline Potter, MD  Multiple Vitamins-Minerals (CENTRUM SILVER ULTRA WOMENS) TABS Take by mouth. 11/18/11  Yes Historical Provider, MD  ranitidine (ZANTAC) 150 MG tablet Take 1 tablet (150 mg total) by mouth 2 (two) times daily as needed for heartburn. 07/10/15  Yes Adline Potter, MD  sennosides-docusate sodium (SENOKOT-S) 8.6-50 MG tablet Take by mouth. 09/18/13  Yes Historical Provider, MD    No Known Allergies  Past Surgical History:  Procedure Laterality Date  . ACOUSTIC NEUROMA RESECTION Right 1985  . APPENDECTOMY    . BREAST SURGERY    . COLON SURGERY    . hydrcephalic Right 123456  . PARATHYROIDECTOMY     partial removal of parathyroid gland  . SHOULDER ARTHROSCOPY W/ ACROMIAL REPAIR Right 23yrs ago  . TUBAL LIGATION      Social History  Substance Use Topics  . Smoking status: Never Smoker  . Smokeless tobacco: Never Used  . Alcohol use 0.6 oz/week    1 Standard drinks or equivalent per week  Family History  Problem Relation Age of Onset  . Hypertension Mother   . Mental illness Mother   . Heart disease Father   . Hypertension Father   . Diabetes Sister   . Stroke Brother     Medication list has been reviewed and updated.  Physical Examination: BP 124/84   Pulse 92   Resp 16   Ht 5\' 1"  (1.549 m)   Wt 128 lb (58.1 kg)   SpO2 98%   BMI 24.19 kg/m   Physical Exam  Constitutional: She is oriented to  person, place, and time. She appears well-developed and well-nourished.  Cardiovascular: Normal rate, regular rhythm and normal heart sounds.   Pulmonary/Chest: Effort normal and breath sounds normal.  Musculoskeletal:  1+ B pedal edema  Neurological: She is alert and oriented to person, place, and time.  Skin: Skin is warm and dry.  Psychiatric: She has a normal mood and affect. Her behavior is normal.  Nursing note and vitals reviewed.   Assessment and Plan:  1. Essential (primary) hypertension Well controlled on lisinopril, amlodipine, Lasix  2. Gastroesophageal reflux disease, esophagitis presence not specified Stable on prn Zantac  3. Goiter, nontoxic, multinodular Stable, followed by endo  4. Dependent edema Stable on Lasix  5. Vitamin D deficiency Level 47 12/2014 on supplement, followed by endo  Return in about 6 months (around 08/06/2016).  Satira Anis. [facility]  02/06/2016

## 2016-02-14 ENCOUNTER — Other Ambulatory Visit: Payer: Self-pay | Admitting: Family Medicine

## 2016-02-14 ENCOUNTER — Telehealth: Payer: Self-pay

## 2016-02-14 MED ORDER — AMLODIPINE BESYLATE 10 MG PO TABS: 10.0000 mg | ORAL_TABLET | Freq: Every day | ORAL | 3 refills | Status: DC

## 2016-02-14 NOTE — Telephone Encounter (Signed)
Pt wanted Amlodipine refilled. Gave message to [facility]- he sent in

## 2016-02-17 ENCOUNTER — Other Ambulatory Visit: Payer: Self-pay | Admitting: Family Medicine

## 2016-02-17 MED ORDER — LISINOPRIL 10 MG PO TABS: 10.0000 mg | ORAL_TABLET | Freq: Every day | ORAL | 3 refills | Status: DC

## 2016-02-26 ENCOUNTER — Other Ambulatory Visit: Payer: Self-pay | Admitting: Family Medicine

## 2016-02-26 MED ORDER — FUROSEMIDE 20 MG PO TABS: 20.0000 mg | ORAL_TABLET | Freq: Every day | ORAL | 3 refills | Status: DC

## 2016-03-04 DIAGNOSIS — M546 Pain in thoracic spine: Secondary | ICD-10-CM | POA: Diagnosis not present

## 2016-03-04 DIAGNOSIS — M9903 Segmental and somatic dysfunction of lumbar region: Secondary | ICD-10-CM | POA: Diagnosis not present

## 2016-03-04 DIAGNOSIS — M530 Cervicocranial syndrome: Secondary | ICD-10-CM | POA: Diagnosis not present

## 2016-03-04 DIAGNOSIS — M9902 Segmental and somatic dysfunction of thoracic region: Secondary | ICD-10-CM | POA: Diagnosis not present

## 2016-03-04 DIAGNOSIS — M5442 Lumbago with sciatica, left side: Secondary | ICD-10-CM | POA: Diagnosis not present

## 2016-03-04 DIAGNOSIS — M9901 Segmental and somatic dysfunction of cervical region: Secondary | ICD-10-CM | POA: Diagnosis not present

## 2016-04-07 ENCOUNTER — Other Ambulatory Visit: Payer: Self-pay | Admitting: Family Medicine

## 2016-04-08 DIAGNOSIS — M9903 Segmental and somatic dysfunction of lumbar region: Secondary | ICD-10-CM | POA: Diagnosis not present

## 2016-04-08 DIAGNOSIS — M5442 Lumbago with sciatica, left side: Secondary | ICD-10-CM | POA: Diagnosis not present

## 2016-04-08 DIAGNOSIS — M530 Cervicocranial syndrome: Secondary | ICD-10-CM | POA: Diagnosis not present

## 2016-04-08 DIAGNOSIS — M9902 Segmental and somatic dysfunction of thoracic region: Secondary | ICD-10-CM | POA: Diagnosis not present

## 2016-04-08 DIAGNOSIS — M9901 Segmental and somatic dysfunction of cervical region: Secondary | ICD-10-CM | POA: Diagnosis not present

## 2016-04-08 DIAGNOSIS — M546 Pain in thoracic spine: Secondary | ICD-10-CM | POA: Diagnosis not present

## 2016-04-13 DIAGNOSIS — H2512 Age-related nuclear cataract, left eye: Secondary | ICD-10-CM | POA: Diagnosis not present

## 2016-04-13 DIAGNOSIS — H04123 Dry eye syndrome of bilateral lacrimal glands: Secondary | ICD-10-CM | POA: Diagnosis not present

## 2016-04-27 DIAGNOSIS — E0520 Thyrotoxicosis with toxic multinodular goiter without thyrotoxic crisis or storm: Secondary | ICD-10-CM | POA: Diagnosis not present

## 2016-04-27 DIAGNOSIS — E042 Nontoxic multinodular goiter: Secondary | ICD-10-CM | POA: Diagnosis not present

## 2016-04-28 DIAGNOSIS — E210 Primary hyperparathyroidism: Secondary | ICD-10-CM | POA: Diagnosis not present

## 2016-04-28 DIAGNOSIS — Z8719 Personal history of other diseases of the digestive system: Secondary | ICD-10-CM | POA: Diagnosis not present

## 2016-04-28 DIAGNOSIS — Z8639 Personal history of other endocrine, nutritional and metabolic disease: Secondary | ICD-10-CM | POA: Diagnosis not present

## 2016-04-28 DIAGNOSIS — R221 Localized swelling, mass and lump, neck: Secondary | ICD-10-CM | POA: Diagnosis not present

## 2016-04-28 DIAGNOSIS — E042 Nontoxic multinodular goiter: Secondary | ICD-10-CM | POA: Diagnosis not present

## 2016-04-28 DIAGNOSIS — E892 Postprocedural hypoparathyroidism: Secondary | ICD-10-CM | POA: Diagnosis not present

## 2016-04-28 DIAGNOSIS — R748 Abnormal levels of other serum enzymes: Secondary | ICD-10-CM | POA: Diagnosis not present

## 2016-04-28 DIAGNOSIS — E0590 Thyrotoxicosis, unspecified without thyrotoxic crisis or storm: Secondary | ICD-10-CM | POA: Diagnosis not present

## 2016-04-28 DIAGNOSIS — M810 Age-related osteoporosis without current pathological fracture: Secondary | ICD-10-CM | POA: Diagnosis not present

## 2016-05-13 DIAGNOSIS — M5442 Lumbago with sciatica, left side: Secondary | ICD-10-CM | POA: Diagnosis not present

## 2016-05-13 DIAGNOSIS — M9903 Segmental and somatic dysfunction of lumbar region: Secondary | ICD-10-CM | POA: Diagnosis not present

## 2016-05-13 DIAGNOSIS — M9902 Segmental and somatic dysfunction of thoracic region: Secondary | ICD-10-CM | POA: Diagnosis not present

## 2016-05-13 DIAGNOSIS — M9901 Segmental and somatic dysfunction of cervical region: Secondary | ICD-10-CM | POA: Diagnosis not present

## 2016-05-13 DIAGNOSIS — M530 Cervicocranial syndrome: Secondary | ICD-10-CM | POA: Diagnosis not present

## 2016-05-13 DIAGNOSIS — M546 Pain in thoracic spine: Secondary | ICD-10-CM | POA: Diagnosis not present

## 2016-06-10 DIAGNOSIS — M9902 Segmental and somatic dysfunction of thoracic region: Secondary | ICD-10-CM | POA: Diagnosis not present

## 2016-06-10 DIAGNOSIS — M530 Cervicocranial syndrome: Secondary | ICD-10-CM | POA: Diagnosis not present

## 2016-06-10 DIAGNOSIS — M9903 Segmental and somatic dysfunction of lumbar region: Secondary | ICD-10-CM | POA: Diagnosis not present

## 2016-06-10 DIAGNOSIS — M9901 Segmental and somatic dysfunction of cervical region: Secondary | ICD-10-CM | POA: Diagnosis not present

## 2016-06-10 DIAGNOSIS — M5442 Lumbago with sciatica, left side: Secondary | ICD-10-CM | POA: Diagnosis not present

## 2016-06-10 DIAGNOSIS — M546 Pain in thoracic spine: Secondary | ICD-10-CM | POA: Diagnosis not present

## 2016-06-25 DIAGNOSIS — M7061 Trochanteric bursitis, right hip: Secondary | ICD-10-CM | POA: Diagnosis not present

## 2016-07-15 DIAGNOSIS — M9903 Segmental and somatic dysfunction of lumbar region: Secondary | ICD-10-CM | POA: Diagnosis not present

## 2016-07-15 DIAGNOSIS — M5442 Lumbago with sciatica, left side: Secondary | ICD-10-CM | POA: Diagnosis not present

## 2016-07-15 DIAGNOSIS — M546 Pain in thoracic spine: Secondary | ICD-10-CM | POA: Diagnosis not present

## 2016-07-15 DIAGNOSIS — M530 Cervicocranial syndrome: Secondary | ICD-10-CM | POA: Diagnosis not present

## 2016-07-15 DIAGNOSIS — M9901 Segmental and somatic dysfunction of cervical region: Secondary | ICD-10-CM | POA: Diagnosis not present

## 2016-07-15 DIAGNOSIS — M9902 Segmental and somatic dysfunction of thoracic region: Secondary | ICD-10-CM | POA: Diagnosis not present

## 2016-07-20 ENCOUNTER — Ambulatory Visit (INDEPENDENT_AMBULATORY_CARE_PROVIDER_SITE_OTHER): Payer: Medicare Other

## 2016-07-20 VITALS — BP 118/74 | HR 60 | Temp 98.7°F | Resp 16 | Ht 61.0 in | Wt 129.4 lb

## 2016-07-20 DIAGNOSIS — Z0000 Encounter for general adult medical examination without abnormal findings: Secondary | ICD-10-CM | POA: Diagnosis not present

## 2016-07-20 NOTE — Patient Instructions (Addendum)
Ms. Boccio , Thank you for taking time to come for your Medicare Wellness Visit. I appreciate your ongoing commitment to your health goals. Please review the following plan we discussed and let me know if I can assist you in the future.   Screening recommendations/referrals: Colonoscopy: Completed 03/02/2009, no longer required  Mammogram: Completed 04/20/2012, no longer required Bone Density: Completed 12/01/2014 - scheduled at [facility] for repeat on 07/22/2016 Recommended yearly ophthalmology/optometry visit for glaucoma screening and checkup Recommended yearly dental visit for hygiene and checkup  Vaccinations: Influenza vaccine: up to date, due 10/2016 Pneumococcal vaccine: Completed series Tdap vaccine: Up to date Shingles vaccine: Completed 07/03/2016   Advanced directives: Please bring a copy at your convenience.  Conditions/risks identified: Recommend drinking 3-4 glasses of water a day.  Next appointment: Follow up with Dr.Plonk on 08/10/2016 at 9:15am, follow up in one year for your annual wellness exam.    Preventive Care 65 Years and Older, Female Preventive care refers to lifestyle choices and visits with your health care provider that can promote health and wellness. What does preventive care include?  A yearly physical exam. This is also called an annual well check.  Dental exams once or twice a year.  Routine eye exams. Ask your health care provider how often you should have your eyes checked.  Personal lifestyle choices, including:  Daily care of your teeth and gums.  Regular physical activity.  Eating a healthy diet.  Avoiding tobacco and drug use.  Limiting alcohol use.  Practicing safe sex.  Taking low-dose aspirin every day.  Taking vitamin and mineral supplements as recommended by your health care provider. What happens during an annual well check? The services and screenings done by your health care provider during your annual well check will depend on  your age, overall health, lifestyle risk factors, and family history of disease. Counseling  Your health care provider may ask you questions about your:  Alcohol use.  Tobacco use.  Drug use.  Emotional well-being.  Home and relationship well-being.  Sexual activity.  Eating habits.  History of falls.  Memory and ability to understand (cognition).  Work and work Statistician.  Reproductive health. Screening  You may have the following tests or measurements:  Height, weight, and BMI.  Blood pressure.  Lipid and cholesterol levels. These may be checked every 5 years, or more frequently if you are over 70 years old.  Skin check.  Lung cancer screening. You may have this screening every year starting at age 24 if you have a 30-pack-year history of smoking and currently smoke or have quit within the past 15 years.  Fecal occult blood test (FOBT) of the stool. You may have this test every year starting at age 78.  Flexible sigmoidoscopy or colonoscopy. You may have a sigmoidoscopy every 5 years or a colonoscopy every 10 years starting at age 59.  Hepatitis C blood test.  Hepatitis B blood test.  Sexually transmitted disease (STD) testing.  Diabetes screening. This is done by checking your blood sugar (glucose) after you have not eaten for a while (fasting). You may have this done every 1-3 years.  Bone density scan. This is done to screen for osteoporosis. You may have this done starting at age 14.  Mammogram. This may be done every 1-2 years. Talk to your health care provider about how often you should have regular mammograms. Talk with your health care provider about your test results, treatment options, and if necessary, the need for more  tests. Vaccines  Your health care provider may recommend certain vaccines, such as:  Influenza vaccine. This is recommended every year.  Tetanus, diphtheria, and acellular pertussis (Tdap, Td) vaccine. You may need a Td booster  every 10 years.  Zoster vaccine. You may need this after age 18.  Pneumococcal 13-valent conjugate (PCV13) vaccine. One dose is recommended after age 1.  Pneumococcal polysaccharide (PPSV23) vaccine. One dose is recommended after age 14. Talk to your health care provider about which screenings and vaccines you need and how often you need them. This information is not intended to replace advice given to you by your health care provider. Make sure you discuss any questions you have with your health care provider. Document Released: 03/15/2015 Document Revised: 11/06/2015 Document Reviewed: 12/18/2014 Elsevier Interactive Patient Education  2017 [place] Prevention in the Home Falls can cause injuries. They can happen to people of all ages. There are many things you can do to make your home safe and to help prevent falls. What can I do on the outside of my home?  Regularly fix the edges of walkways and driveways and fix any cracks.  Remove anything that might make you trip as you walk through a door, such as a raised step or threshold.  Trim any bushes or trees on the path to your home.  Use bright outdoor lighting.  Clear any walking paths of anything that might make someone trip, such as rocks or tools.  Regularly check to see if handrails are loose or broken. Make sure that both sides of any steps have handrails.  Any raised decks and porches should have guardrails on the edges.  Have any leaves, snow, or ice cleared regularly.  Use sand or salt on walking paths during winter.  Clean up any spills in your garage right away. This includes oil or grease spills. What can I do in the bathroom?  Use night lights.  Install grab bars by the toilet and in the tub and shower. Do not use towel bars as grab bars.  Use non-skid mats or decals in the tub or shower.  If you need to sit down in the shower, use a plastic, non-slip stool.  Keep the floor dry. Clean up any  water that spills on the floor as soon as it happens.  Remove soap buildup in the tub or shower regularly.  Attach bath mats securely with double-sided non-slip rug tape.  Do not have throw rugs and other things on the floor that can make you trip. What can I do in the bedroom?  Use night lights.  Make sure that you have a light by your bed that is easy to reach.  Do not use any sheets or blankets that are too big for your bed. They should not hang down onto the floor.  Have a firm chair that has side arms. You can use this for support while you get dressed.  Do not have throw rugs and other things on the floor that can make you trip. What can I do in the kitchen?  Clean up any spills right away.  Avoid walking on wet floors.  Keep items that you use a lot in easy-to-reach places.  If you need to reach something above you, use a strong step stool that has a grab bar.  Keep electrical cords out of the way.  Do not use floor polish or wax that makes floors slippery. If you must use wax, use non-skid floor  wax.  Do not have throw rugs and other things on the floor that can make you trip. What can I do with my stairs?  Do not leave any items on the stairs.  Make sure that there are handrails on both sides of the stairs and use them. Fix handrails that are broken or loose. Make sure that handrails are as long as the stairways.  Check any carpeting to make sure that it is firmly attached to the stairs. Fix any carpet that is loose or worn.  Avoid having throw rugs at the top or bottom of the stairs. If you do have throw rugs, attach them to the floor with carpet tape.  Make sure that you have a light switch at the top of the stairs and the bottom of the stairs. If you do not have them, ask someone to add them for you. What else can I do to help prevent falls?  Wear shoes that:  Do not have high heels.  Have rubber bottoms.  Are comfortable and fit you well.  Are closed  at the toe. Do not wear sandals.  If you use a stepladder:  Make sure that it is fully opened. Do not climb a closed stepladder.  Make sure that both sides of the stepladder are locked into place.  Ask someone to hold it for you, if possible.  Clearly mark and make sure that you can see:  Any grab bars or handrails.  First and last steps.  Where the edge of each step is.  Use tools that help you move around (mobility aids) if they are needed. These include:  Canes.  Walkers.  Scooters.  Crutches.  Turn on the lights when you go into a dark area. Replace any light bulbs as soon as they burn out.  Set up your furniture so you have a clear path. Avoid moving your furniture around.  If any of your floors are uneven, fix them.  If there are any pets around you, be aware of where they are.  Review your medicines with your doctor. Some medicines can make you feel dizzy. This can increase your chance of falling. Ask your doctor what other things that you can do to help prevent falls. This information is not intended to replace advice given to you by your health care provider. Make sure you discuss any questions you have with your health care provider. Document Released: 12/13/2008 Document Revised: 07/25/2015 Document Reviewed: 03/23/2014 Elsevier Interactive Patient Education  2017 [employer].

## 2016-07-20 NOTE — Progress Notes (Signed)
Subjective:   Danielle Herrera is a 83 y.o. female who presents for Medicare Annual (Subsequent) preventive examination.  Review of Systems:  N/A Cardiac Risk Factors include: advanced age (>46men, >73 women);hypertension;dyslipidemia     Objective:     Vitals: BP 118/74 (BP Location: Left Arm, Patient Position: Sitting)   Pulse 60   Temp 98.7 F (37.1 C)   Resp 16   Ht 5\' 1"  (1.549 m)   Wt 129 lb 6.4 oz (58.7 kg)   BMI 24.45 kg/m   Body mass index is 24.45 kg/m.   Tobacco History  Smoking Status  . Never Smoker  Smokeless Tobacco  . Never Used     Counseling given: Not Answered   Past Medical History:  Diagnosis Date  . Allergy   . Arthritis of knee, left 01/20/2010  . Esophageal reflux 08/13/2014  . Goiter, nontoxic, multinodular 05/23/2012  . H/O jaundice   . 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 HLD (hyperlipidemia) 08/13/2014  . Hyperparathyroidism, primary ([place]) 08/02/2013  . Hypertension   . Osteopenia 01/20/2010  . Osteoporosis   . Thyroid disease   . Ventricular shunt in place 08/13/2014  . Vitamin D deficiency 08/13/2014   Past Surgical History:  Procedure Laterality Date  . ACOUSTIC NEUROMA RESECTION Right 1985  . APPENDECTOMY    . BREAST SURGERY    . COLON SURGERY    . EYE SURGERY     cataract  . hydrcephalic Right 3546  . PARATHYROIDECTOMY     partial removal of parathyroid gland  . SHOULDER ARTHROSCOPY W/ ACROMIAL REPAIR Right 8yrs ago  . TUBAL LIGATION     Family History  Problem Relation Age of Onset  . Hypertension Mother   . Mental illness Mother   . Heart disease Father   . Hypertension Father   . Diabetes Sister   . Stroke Brother    History  Sexual Activity  . Sexual activity: No    Outpatient Encounter Prescriptions as of 07/20/2016  Medication Sig  . amLODipine (NORVASC) 10 MG tablet Take 1 tablet (10 mg total) by mouth daily.  . Carboxymethylcellul-Glycerin (OPTIVE) 0.5-0.9 % SOLN 1 drop daily as needed.  . Cholecalciferol  (VITAMIN D) 2000 UNITS CAPS Take 1 capsule (2,000 Units total) by mouth daily.  . diphenhydrAMINE (BENADRYL) 25 mg capsule Take 25 mg by mouth at bedtime as needed for sleep.  . furosemide (LASIX) 20 MG tablet Take 1 tablet (20 mg total) by mouth daily.  Marland Kitchen lisinopril (PRINIVIL,ZESTRIL) 10 MG tablet Take 1 tablet (10 mg total) by mouth daily.  . methimazole (TAPAZOLE) 5 MG tablet TAKE 1 TABLET (5 MG TOTAL) BY MOUTH DAILY.  . Multiple Vitamins-Minerals (CENTRUM SILVER ULTRA WOMENS) TABS Take by mouth.  . ranitidine (ZANTAC) 150 MG tablet Take 1 tablet (150 mg total) by mouth 2 (two) times daily as needed for heartburn.  . sennosides-docusate sodium (SENOKOT-S) 8.6-50 MG tablet Take by mouth.  . [DISCONTINUED] acetaminophen (TYLENOL) 500 MG tablet Take 2 tablets (1,000 mg total) by mouth every 8 (eight) hours as needed. (Patient not taking: Reported on 07/20/2016)  . [DISCONTINUED] Calcium Citrate-Vitamin D 315-250 MG-UNIT TABS Take 1 tablet by mouth 2 (two) times daily.   No facility-administered encounter medications on file as of 07/20/2016.     Activities of Daily Living In your present state of health, do you have any difficulty performing the following activities: 07/20/2016  Hearing? Y  Vision? N  Difficulty concentrating or making decisions? N  Walking or  climbing stairs? N  Dressing or bathing? N  Doing errands, shopping? N  Preparing Food and eating ? N  Using the Toilet? N  In the past six months, have you accidently leaked urine? N  Do you have problems with loss of bowel control? N  Managing your Medications? N  Managing your Finances? N  Housekeeping or managing your Housekeeping? N  Some recent data might be hidden    Patient Care Team: Adline Potter, MD as PCP - General (Family Medicine) Lanier Clam, MD (Internal Medicine)    Assessment:     Exercise Activities and Dietary recommendations Current Exercise Habits: Home exercise routine, Type of exercise:  walking, Time (Minutes): 30, Frequency (Times/Week): 5, Weekly Exercise (Minutes/Week): 150, Intensity: Mild  Goals    . Increase water intake          Recommend drinking 3-4 glasses of water a day.      Fall Risk Fall Risk  07/20/2016 07/10/2015 02/19/2015 08/13/2014  Falls in the past year? No No Yes Yes  Number falls in past yr: - - 1 2 or more  Injury with Fall? - - No Yes   Depression Screen PHQ 2/9 Scores 07/20/2016 07/20/2016 07/10/2015 02/19/2015  PHQ - 2 Score 0 0 0 0  PHQ- 9 Score 1 - - -     Cognitive Function     6CIT Screen 07/20/2016  What Year? 0 points  What month? 0 points  What time? 0 points  Count back from 20 0 points  Months in reverse 0 points  Repeat phrase 2 points  Total Score 2    Immunization History  Administered Date(s) Administered  . Influenza-Unspecified 12/11/2014, 11/21/2015  . Pneumococcal Polysaccharide-23 11/21/2015  . Pneumococcal-Unspecified 10/31/2012  . Tdap 08/13/2014  . Zoster Recombinat (Shingrix) 07/03/2016   Screening Tests Health Maintenance  Topic Date Due  . INFLUENZA VACCINE  09/30/2016  . PNA vac Low Risk Adult (2 of 2 - PCV13) 11/20/2016  . TETANUS/TDAP  08/12/2024  . DEXA SCAN  Completed      Plan:    I have personally reviewed and addressed the Medicare Annual Wellness questionnaire and have noted the following in the patient's chart:  A. Medical and social history B. Use of alcohol, tobacco or illicit drugs  C. Current medications and supplements D. Functional ability and status E.  Nutritional status F.  Physical activity G. Advance directives H. List of other physicians I.  Hospitalizations, surgeries, and ER visits in previous 12 months J.  [place] such as hearing and vision if needed, cognitive and depression L. Referrals and appointments - appt with Dr.Plonk on 08/10/2016 at 915am  In addition, I have reviewed and discussed with patient certain preventive protocols, quality metrics, and  best practice recommendations. A written personalized care plan for preventive services as well as general preventive health recommendations were provided to patient.   Signed,  Tyler Aas, LPN Nurse Health Advisor   MD Recommendations:  Labs done at [facility] on 2/27/218, results in care everywhere.

## 2016-07-22 DIAGNOSIS — Z8639 Personal history of other endocrine, nutritional and metabolic disease: Secondary | ICD-10-CM | POA: Diagnosis not present

## 2016-07-22 DIAGNOSIS — M8589 Other specified disorders of bone density and structure, multiple sites: Secondary | ICD-10-CM | POA: Diagnosis not present

## 2016-08-10 ENCOUNTER — Encounter: Payer: Self-pay | Admitting: Family Medicine

## 2016-08-10 ENCOUNTER — Ambulatory Visit (INDEPENDENT_AMBULATORY_CARE_PROVIDER_SITE_OTHER): Payer: Medicare Other | Admitting: Family Medicine

## 2016-08-10 VITALS — BP 138/70 | HR 72 | Ht 61.0 in | Wt 130.0 lb

## 2016-08-10 DIAGNOSIS — I10 Essential (primary) hypertension: Secondary | ICD-10-CM | POA: Diagnosis not present

## 2016-08-10 DIAGNOSIS — R609 Edema, unspecified: Secondary | ICD-10-CM | POA: Diagnosis not present

## 2016-08-10 DIAGNOSIS — E042 Nontoxic multinodular goiter: Secondary | ICD-10-CM | POA: Diagnosis not present

## 2016-08-10 DIAGNOSIS — K219 Gastro-esophageal reflux disease without esophagitis: Secondary | ICD-10-CM

## 2016-08-10 DIAGNOSIS — K5901 Slow transit constipation: Secondary | ICD-10-CM

## 2016-08-10 DIAGNOSIS — M8580 Other specified disorders of bone density and structure, unspecified site: Secondary | ICD-10-CM

## 2016-08-10 MED ORDER — RANITIDINE HCL 150 MG PO TABS: 150.0000 mg | ORAL_TABLET | ORAL | Status: DC | PRN

## 2016-08-10 NOTE — Patient Instructions (Addendum)
Avoid Benedryl (causes confusion and increased risk of falls).

## 2016-08-11 DIAGNOSIS — K5900 Constipation, unspecified: Secondary | ICD-10-CM | POA: Insufficient documentation

## 2016-08-11 NOTE — Progress Notes (Signed)
Date:  08/10/2016   Name:  Danielle Herrera   DOB:  [birth year]   MRN:  [id number]  PCP:  Adline Potter, MD    Chief Complaint: Follow-up (medication refills)   History of Present Illness:  This is a 83 y.o. female seen for six month f/u. GERD well controlled on prn Zantac. Stopped Tylenol due to elevated LFTs, using Aleve prn. Remains on tapazole/vit D/calcium per endo. Takes Senokot daily for constipation. Had shingles shot at pharmacy.  Review of Systems:  Review of Systems  Constitutional: Negative for chills and fever.  Respiratory: Negative for cough and shortness of breath.   Cardiovascular: Negative for chest pain and leg swelling.  Genitourinary: Negative for difficulty urinating.  Neurological: Negative for syncope and light-headedness.    Patient Active Problem List   Diagnosis Date Noted  . Pseudophakia of right eye 12/12/2015  . S/P parathyroidectomy ([place]) 07/10/2015  . Polypharmacy 02/19/2015  . 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 HLD (hyperlipidemia) 08/13/2014  . Vitamin D deficiency 08/13/2014  . Ventricular shunt in place 08/13/2014  . Esophageal reflux 08/13/2014  . 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 Abnormal gait 08/26/2012  . Goiter, nontoxic, multinodular 05/23/2012  . Acoustic neuroma ([place]) 01/20/2010  . Colon polyp 01/20/2010  . Dependent edema 01/20/2010  . Essential (primary) hypertension 01/20/2010  . Arthritis of knee, left 01/20/2010  . Osteopenia 01/20/2010    Prior to Admission medications   Medication Sig Start Date End Date Taking? Authorizing Provider  amLODipine (NORVASC) 10 MG tablet Take 1 tablet (10 mg total) by mouth daily. 02/14/16  Yes Amier Hoyt, Gwyndolyn Saxon, MD  calcium carbonate (TUMS EX) 750 MG chewable tablet Chew 1 tablet by mouth 2 (two) times daily. 06/13/15  Yes [provider]  Carboxymethylcellul-Glycerin (OPTIVE) 0.5-0.9 % SOLN 1 drop daily as needed. 11/18/11  Yes [provider]  Cholecalciferol (VITAMIN D) 2000  UNITS CAPS Take 1 capsule (2,000 Units total) by mouth daily. 08/13/14  Yes Cristiano Capri, Gwyndolyn Saxon, MD  furosemide (LASIX) 20 MG tablet Take 1 tablet (20 mg total) by mouth daily. 02/26/16  Yes Calin Fantroy, Gwyndolyn Saxon, MD  lisinopril (PRINIVIL,ZESTRIL) 10 MG tablet Take 1 tablet (10 mg total) by mouth daily. 02/17/16 02/16/17 Yes Kanan Sobek, Gwyndolyn Saxon, MD  methimazole (TAPAZOLE) 5 MG tablet TAKE 1 TABLET (5 MG TOTAL) BY MOUTH DAILY. 04/07/16  Yes Angelos Wasco, Gwyndolyn Saxon, MD  Multiple Vitamins-Minerals (CENTRUM SILVER ULTRA WOMENS) TABS Take by mouth. 11/18/11  Yes [provider]  ranitidine (ZANTAC) 150 MG tablet Take 1 tablet (150 mg total) by mouth as needed for heartburn. 08/10/16  Yes Raeann Offner, Gwyndolyn Saxon, MD  sennosides-docusate sodium (SENOKOT-S) 8.6-50 MG tablet Take by mouth. 09/18/13  Yes [provider]    No Known Allergies  Past Surgical History:  Procedure Laterality Date  . ACOUSTIC NEUROMA RESECTION Right 1985  . APPENDECTOMY    . BREAST SURGERY    . COLON SURGERY    . EYE SURGERY     cataract  . hydrcephalic Right 8676  . PARATHYROIDECTOMY     partial removal of parathyroid gland  . SHOULDER ARTHROSCOPY W/ ACROMIAL REPAIR Right 65yrs ago  . TUBAL LIGATION      Social History  Substance Use Topics  . Smoking status: Never Smoker  . Smokeless tobacco: Never Used  . Alcohol use 1.8 oz/week    1 Standard drinks or equivalent, 2 Glasses of wine per week    Family History  Problem Relation Age of Onset  . Hypertension Mother   . Mental illness  Mother   . Heart disease Father   . Hypertension Father   . Diabetes Sister   . Stroke Brother     Medication list has been reviewed and updated.  Physical Examination: BP 138/70   Pulse 72   Ht 5\' 1"  (1.549 m)   Wt 130 lb (59 kg)   BMI 24.56 kg/m   Physical Exam  Constitutional: She appears well-developed and well-nourished.  Cardiovascular: Normal rate, regular rhythm and normal heart sounds.   Pulmonary/Chest: Effort normal and breath  sounds normal.  Musculoskeletal: She exhibits no edema.  Neurological: She is alert.  Skin: Skin is warm and dry.  Psychiatric: She has a normal mood and affect. Her behavior is normal.  Nursing note and vitals reviewed.   Assessment and Plan:  1. Essential (primary) hypertension Adequate control on lisinopril/amlodipine/Lasix  2. Gastroesophageal reflux disease, esophagitis presence not specified Well controlled on prn Zantac  3. Goiter, nontoxic, multinodular On tapazole, followed by endo, s/p recent nodule excision for elevated PTH  4. Osteopenia, unspecified location On vit D/calcium per endo  5. Dependent edema Well controlled on Lasix  6. Slow transit constipation Adequate control on daily Senokot  7. Med review Avoid Benedryl due to confusion/fall risk  Return in about 6 months (around 02/09/2017).  Satira Anis. [facility]  08/11/2016

## 2016-08-26 DIAGNOSIS — M546 Pain in thoracic spine: Secondary | ICD-10-CM | POA: Diagnosis not present

## 2016-08-26 DIAGNOSIS — M9903 Segmental and somatic dysfunction of lumbar region: Secondary | ICD-10-CM | POA: Diagnosis not present

## 2016-08-26 DIAGNOSIS — M5442 Lumbago with sciatica, left side: Secondary | ICD-10-CM | POA: Diagnosis not present

## 2016-08-26 DIAGNOSIS — M9902 Segmental and somatic dysfunction of thoracic region: Secondary | ICD-10-CM | POA: Diagnosis not present

## 2016-08-26 DIAGNOSIS — M9901 Segmental and somatic dysfunction of cervical region: Secondary | ICD-10-CM | POA: Diagnosis not present

## 2016-08-26 DIAGNOSIS — M530 Cervicocranial syndrome: Secondary | ICD-10-CM | POA: Diagnosis not present

## 2016-10-01 DIAGNOSIS — L570 Actinic keratosis: Secondary | ICD-10-CM | POA: Diagnosis not present

## 2016-10-01 DIAGNOSIS — C44311 Basal cell carcinoma of skin of nose: Secondary | ICD-10-CM | POA: Diagnosis not present

## 2016-10-01 DIAGNOSIS — D485 Neoplasm of uncertain behavior of skin: Secondary | ICD-10-CM | POA: Diagnosis not present

## 2016-10-07 DIAGNOSIS — M5442 Lumbago with sciatica, left side: Secondary | ICD-10-CM | POA: Diagnosis not present

## 2016-10-07 DIAGNOSIS — M9902 Segmental and somatic dysfunction of thoracic region: Secondary | ICD-10-CM | POA: Diagnosis not present

## 2016-10-07 DIAGNOSIS — M9901 Segmental and somatic dysfunction of cervical region: Secondary | ICD-10-CM | POA: Diagnosis not present

## 2016-10-07 DIAGNOSIS — M9903 Segmental and somatic dysfunction of lumbar region: Secondary | ICD-10-CM | POA: Diagnosis not present

## 2016-10-07 DIAGNOSIS — M530 Cervicocranial syndrome: Secondary | ICD-10-CM | POA: Diagnosis not present

## 2016-10-07 DIAGNOSIS — M546 Pain in thoracic spine: Secondary | ICD-10-CM | POA: Diagnosis not present

## 2016-10-29 DIAGNOSIS — M7061 Trochanteric bursitis, right hip: Secondary | ICD-10-CM | POA: Diagnosis not present

## 2016-11-05 DIAGNOSIS — Z961 Presence of intraocular lens: Secondary | ICD-10-CM | POA: Diagnosis not present

## 2016-11-05 DIAGNOSIS — H2512 Age-related nuclear cataract, left eye: Secondary | ICD-10-CM | POA: Diagnosis not present

## 2016-11-11 DIAGNOSIS — M9901 Segmental and somatic dysfunction of cervical region: Secondary | ICD-10-CM | POA: Diagnosis not present

## 2016-11-11 DIAGNOSIS — M9903 Segmental and somatic dysfunction of lumbar region: Secondary | ICD-10-CM | POA: Diagnosis not present

## 2016-11-11 DIAGNOSIS — M530 Cervicocranial syndrome: Secondary | ICD-10-CM | POA: Diagnosis not present

## 2016-11-11 DIAGNOSIS — M5442 Lumbago with sciatica, left side: Secondary | ICD-10-CM | POA: Diagnosis not present

## 2016-11-11 DIAGNOSIS — M546 Pain in thoracic spine: Secondary | ICD-10-CM | POA: Diagnosis not present

## 2016-11-11 DIAGNOSIS — M9902 Segmental and somatic dysfunction of thoracic region: Secondary | ICD-10-CM | POA: Diagnosis not present

## 2016-11-12 DIAGNOSIS — L570 Actinic keratosis: Secondary | ICD-10-CM | POA: Diagnosis not present

## 2016-11-12 DIAGNOSIS — L578 Other skin changes due to chronic exposure to nonionizing radiation: Secondary | ICD-10-CM | POA: Diagnosis not present

## 2016-11-12 DIAGNOSIS — C4491 Basal cell carcinoma of skin, unspecified: Secondary | ICD-10-CM | POA: Diagnosis not present

## 2016-11-30 DIAGNOSIS — C44311 Basal cell carcinoma of skin of nose: Secondary | ICD-10-CM | POA: Diagnosis not present

## 2016-12-07 DIAGNOSIS — D485 Neoplasm of uncertain behavior of skin: Secondary | ICD-10-CM | POA: Diagnosis not present

## 2016-12-07 DIAGNOSIS — Z48817 Encounter for surgical aftercare following surgery on the skin and subcutaneous tissue: Secondary | ICD-10-CM | POA: Diagnosis not present

## 2016-12-09 DIAGNOSIS — L570 Actinic keratosis: Secondary | ICD-10-CM | POA: Diagnosis not present

## 2016-12-16 DIAGNOSIS — M9903 Segmental and somatic dysfunction of lumbar region: Secondary | ICD-10-CM | POA: Diagnosis not present

## 2016-12-16 DIAGNOSIS — M9902 Segmental and somatic dysfunction of thoracic region: Secondary | ICD-10-CM | POA: Diagnosis not present

## 2016-12-16 DIAGNOSIS — M530 Cervicocranial syndrome: Secondary | ICD-10-CM | POA: Diagnosis not present

## 2016-12-16 DIAGNOSIS — M546 Pain in thoracic spine: Secondary | ICD-10-CM | POA: Diagnosis not present

## 2016-12-16 DIAGNOSIS — M5442 Lumbago with sciatica, left side: Secondary | ICD-10-CM | POA: Diagnosis not present

## 2016-12-16 DIAGNOSIS — M9901 Segmental and somatic dysfunction of cervical region: Secondary | ICD-10-CM | POA: Diagnosis not present

## 2016-12-21 DIAGNOSIS — D485 Neoplasm of uncertain behavior of skin: Secondary | ICD-10-CM | POA: Diagnosis not present

## 2016-12-21 DIAGNOSIS — Z08 Encounter for follow-up examination after completed treatment for malignant neoplasm: Secondary | ICD-10-CM | POA: Diagnosis not present

## 2016-12-21 DIAGNOSIS — Z85828 Personal history of other malignant neoplasm of skin: Secondary | ICD-10-CM | POA: Diagnosis not present

## 2016-12-21 DIAGNOSIS — L988 Other specified disorders of the skin and subcutaneous tissue: Secondary | ICD-10-CM | POA: Diagnosis not present

## 2016-12-21 DIAGNOSIS — Z48817 Encounter for surgical aftercare following surgery on the skin and subcutaneous tissue: Secondary | ICD-10-CM | POA: Diagnosis not present

## 2017-01-12 ENCOUNTER — Encounter: Payer: Self-pay | Admitting: Family Medicine

## 2017-01-12 ENCOUNTER — Ambulatory Visit (INDEPENDENT_AMBULATORY_CARE_PROVIDER_SITE_OTHER): Payer: Medicare Other | Admitting: Family Medicine

## 2017-01-12 VITALS — BP 122/82 | HR 91 | Resp 16 | Ht 61.0 in | Wt 131.0 lb

## 2017-01-12 DIAGNOSIS — M7061 Trochanteric bursitis, right hip: Secondary | ICD-10-CM | POA: Diagnosis not present

## 2017-01-12 DIAGNOSIS — R609 Edema, unspecified: Secondary | ICD-10-CM

## 2017-01-12 DIAGNOSIS — K5901 Slow transit constipation: Secondary | ICD-10-CM | POA: Diagnosis not present

## 2017-01-12 DIAGNOSIS — I10 Essential (primary) hypertension: Secondary | ICD-10-CM | POA: Diagnosis not present

## 2017-01-12 DIAGNOSIS — K219 Gastro-esophageal reflux disease without esophagitis: Secondary | ICD-10-CM

## 2017-01-12 DIAGNOSIS — E042 Nontoxic multinodular goiter: Secondary | ICD-10-CM

## 2017-01-12 DIAGNOSIS — M8580 Other specified disorders of bone density and structure, unspecified site: Secondary | ICD-10-CM | POA: Diagnosis not present

## 2017-01-12 NOTE — Progress Notes (Signed)
Date:  01/12/2017   Name:  Danielle Herrera   DOB:  1933-09-09   MRN:  [id number]  PCP:  Adline Potter, MD    Chief Complaint: Hypertension   History of Present Illness:  This is a 83 y.o. female seen for three month f/u. Concerned with occ high BPs at home, 158/93 yesterday. GERD well controlled, taking Zantac rarely. Remains on Tapazole for goiter per endo, has f/u next week. BLE edema stable on Lasix, constipation stable on Senokot. Seeing [facility] for R trochanteric bursitis but shots not helping. Has BBC removed from nose lately by derm. Has occ AM cough. Had episode R sided non-exertional CP few days ago.  Review of Systems:  Review of Systems  Constitutional: Negative for chills and fever.  Respiratory: Negative for shortness of breath.   Gastrointestinal: Negative for nausea.  Genitourinary: Negative for difficulty urinating.  Neurological: Negative for syncope and light-headedness.    Patient Active Problem List   Diagnosis Date Noted  . Trochanteric bursitis of right hip 01/12/2017  . Constipation 08/11/2016  . Pseudophakia of right eye 12/12/2015  . S/P parathyroidectomy ([place]) 07/10/2015  . Hyperthyroidism, subclinical 04/22/2015  . Polypharmacy 02/19/2015  . 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 HLD (hyperlipidemia) 08/13/2014  . Vitamin D deficiency 08/13/2014  . Ventricular shunt in place 08/13/2014  . Esophageal reflux 08/13/2014  . Normal pressure hydrocephalus 11/05/2012  . Abnormal gait 08/26/2012  . Goiter, nontoxic, multinodular 05/23/2012  . Acoustic neuroma ([place]) 01/20/2010  . Colon polyp 01/20/2010  . Dependent edema 01/20/2010  . Essential (primary) hypertension 01/20/2010  . Arthritis of knee, left 01/20/2010  . Osteopenia 01/20/2010    Prior to Admission medications   Medication Sig Start Date End Date Taking? Authorizing Provider  amLODipine (NORVASC) 10 MG tablet Take 1 tablet (10 mg total) by mouth daily. 02/14/16  Yes Copper Kirtley, Gwyndolyn Saxon, MD   calcium carbonate (TUMS EX) 750 MG chewable tablet Chew 1 tablet by mouth 2 (two) times daily. 06/13/15  Yes [provider]  Carboxymethylcellul-Glycerin (OPTIVE) 0.5-0.9 % SOLN 1 drop daily as needed. 11/18/11  Yes [provider]  Cholecalciferol (VITAMIN D) 2000 UNITS CAPS Take 1 capsule (2,000 Units total) by mouth daily. 08/13/14  Yes Elverda Wendel, Gwyndolyn Saxon, MD  furosemide (LASIX) 20 MG tablet Take 1 tablet (20 mg total) by mouth daily. 02/26/16  Yes Raymond Azure, Gwyndolyn Saxon, MD  lisinopril (PRINIVIL,ZESTRIL) 10 MG tablet Take 1 tablet (10 mg total) by mouth daily. 02/17/16 02/16/17 Yes Nyaira Hodgens, Gwyndolyn Saxon, MD  methimazole (TAPAZOLE) 5 MG tablet TAKE 1 TABLET (5 MG TOTAL) BY MOUTH DAILY. 04/07/16  Yes Jermani Eberlein, Gwyndolyn Saxon, MD  Multiple Vitamins-Minerals (CENTRUM SILVER ULTRA WOMENS) TABS Take by mouth. 11/18/11  Yes [provider]  ranitidine (ZANTAC) 150 MG tablet Take 1 tablet (150 mg total) by mouth as needed for heartburn. 08/10/16  Yes Leandria Thier, Gwyndolyn Saxon, MD  sennosides-docusate sodium (SENOKOT-S) 8.6-50 MG tablet Take by mouth. 09/18/13  Yes [provider]    Allergies  Allergen Reactions  . Tylenol [Acetaminophen] Other (See Comments)    Elevated LFTs    Past Surgical History:  Procedure Laterality Date  . ACOUSTIC NEUROMA RESECTION Right 1985  . APPENDECTOMY    . BREAST SURGERY    . COLON SURGERY    . EYE SURGERY     cataract  . hydrcephalic Right 8756  . PARATHYROIDECTOMY     partial removal of parathyroid gland  . SHOULDER ARTHROSCOPY W/ ACROMIAL REPAIR Right 86yrs ago  . TUBAL LIGATION  Social History   Tobacco Use  . Smoking status: Never Smoker  . Smokeless tobacco: Never Used  Substance Use Topics  . Alcohol use: Yes    Alcohol/week: 1.8 oz    Types: 1 Standard drinks or equivalent, 2 Glasses of wine per week  . Drug use: No    Family History  Problem Relation Age of Onset  . Hypertension Mother   . Mental illness Mother   . Heart disease Father    . Hypertension Father   . Diabetes Sister   . Stroke Brother     Medication list has been reviewed and updated.  Physical Examination: BP 122/82   Pulse 91   Resp 16   Ht 5\' 1"  (1.549 m)   Wt 131 lb (59.4 kg)   SpO2 98%   BMI 24.75 kg/m   Physical Exam  Constitutional: She appears well-developed and well-nourished.  Cardiovascular: Normal rate, regular rhythm and normal heart sounds.  Pulmonary/Chest: Effort normal and breath sounds normal.  Musculoskeletal:  1+ BLE edema  Neurological: She is alert.  Skin: Skin is warm and dry.  Psychiatric: She has a normal mood and affect. Her behavior is normal.  Nursing note and vitals reviewed.   Assessment and Plan:  1. Essential (primary) hypertension Home BPs reviewed, mostly well controlled and excellent today, cont lisinopril/amlodipine/Lasix, BMP next visit  2. Gastroesophageal reflux disease, esophagitis presence not specified Stable on prn Zantac  3. Goiter, nontoxic, multinodular Stable on Tapazole, endo following  4. Osteopenia, unspecified location On Ca++/vit D per endo  5. Dependent edema Adequate control on Lasix  6. Slow transit constipation Stable on daily Senokot  7. Trochanteric bursitis of right hip F/u with ortho  Return in about 6 months (around 07/12/2017).  Satira Anis. [facility]  01/12/2017

## 2017-01-26 DIAGNOSIS — M530 Cervicocranial syndrome: Secondary | ICD-10-CM | POA: Diagnosis not present

## 2017-01-26 DIAGNOSIS — M546 Pain in thoracic spine: Secondary | ICD-10-CM | POA: Diagnosis not present

## 2017-01-26 DIAGNOSIS — M9903 Segmental and somatic dysfunction of lumbar region: Secondary | ICD-10-CM | POA: Diagnosis not present

## 2017-01-26 DIAGNOSIS — M9901 Segmental and somatic dysfunction of cervical region: Secondary | ICD-10-CM | POA: Diagnosis not present

## 2017-01-26 DIAGNOSIS — M5442 Lumbago with sciatica, left side: Secondary | ICD-10-CM | POA: Diagnosis not present

## 2017-01-26 DIAGNOSIS — M9902 Segmental and somatic dysfunction of thoracic region: Secondary | ICD-10-CM | POA: Diagnosis not present

## 2017-01-28 DIAGNOSIS — E0520 Thyrotoxicosis with toxic multinodular goiter without thyrotoxic crisis or storm: Secondary | ICD-10-CM | POA: Diagnosis not present

## 2017-01-28 DIAGNOSIS — E0590 Thyrotoxicosis, unspecified without thyrotoxic crisis or storm: Secondary | ICD-10-CM | POA: Diagnosis not present

## 2017-01-28 DIAGNOSIS — E210 Primary hyperparathyroidism: Secondary | ICD-10-CM | POA: Diagnosis not present

## 2017-01-28 DIAGNOSIS — E042 Nontoxic multinodular goiter: Secondary | ICD-10-CM | POA: Diagnosis not present

## 2017-02-01 ENCOUNTER — Other Ambulatory Visit: Payer: Self-pay | Admitting: Family Medicine

## 2017-02-01 MED ORDER — LISINOPRIL 10 MG PO TABS: 10.0000 mg | ORAL_TABLET | Freq: Every day | ORAL | 3 refills | Status: DC

## 2017-02-01 MED ORDER — AMLODIPINE BESYLATE 10 MG PO TABS: 10.0000 mg | ORAL_TABLET | Freq: Every day | ORAL | 3 refills | Status: DC

## 2017-02-09 ENCOUNTER — Other Ambulatory Visit: Payer: Self-pay | Admitting: Family Medicine

## 2017-02-09 ENCOUNTER — Ambulatory Visit: Payer: Medicare Other | Admitting: Family Medicine

## 2017-02-09 MED ORDER — FUROSEMIDE 20 MG PO TABS: 20.0000 mg | ORAL_TABLET | Freq: Every day | ORAL | 3 refills | Status: DC

## 2017-02-25 DIAGNOSIS — M9902 Segmental and somatic dysfunction of thoracic region: Secondary | ICD-10-CM | POA: Diagnosis not present

## 2017-02-25 DIAGNOSIS — M9903 Segmental and somatic dysfunction of lumbar region: Secondary | ICD-10-CM | POA: Diagnosis not present

## 2017-02-25 DIAGNOSIS — M530 Cervicocranial syndrome: Secondary | ICD-10-CM | POA: Diagnosis not present

## 2017-02-25 DIAGNOSIS — M546 Pain in thoracic spine: Secondary | ICD-10-CM | POA: Diagnosis not present

## 2017-02-25 DIAGNOSIS — M9901 Segmental and somatic dysfunction of cervical region: Secondary | ICD-10-CM | POA: Diagnosis not present

## 2017-02-25 DIAGNOSIS — M5442 Lumbago with sciatica, left side: Secondary | ICD-10-CM | POA: Diagnosis not present

## 2017-03-02 DIAGNOSIS — M779 Enthesopathy, unspecified: Secondary | ICD-10-CM

## 2017-03-02 HISTORY — DX: Enthesopathy, unspecified: M77.9

## 2017-03-09 DIAGNOSIS — M7061 Trochanteric bursitis, right hip: Secondary | ICD-10-CM | POA: Diagnosis not present

## 2017-03-10 DIAGNOSIS — M5136 Other intervertebral disc degeneration, lumbar region: Secondary | ICD-10-CM | POA: Diagnosis not present

## 2017-03-10 DIAGNOSIS — M546 Pain in thoracic spine: Secondary | ICD-10-CM | POA: Diagnosis not present

## 2017-03-10 DIAGNOSIS — M9905 Segmental and somatic dysfunction of pelvic region: Secondary | ICD-10-CM | POA: Diagnosis not present

## 2017-03-10 DIAGNOSIS — M461 Sacroiliitis, not elsewhere classified: Secondary | ICD-10-CM | POA: Diagnosis not present

## 2017-03-10 DIAGNOSIS — M9903 Segmental and somatic dysfunction of lumbar region: Secondary | ICD-10-CM | POA: Diagnosis not present

## 2017-03-10 DIAGNOSIS — M9902 Segmental and somatic dysfunction of thoracic region: Secondary | ICD-10-CM | POA: Diagnosis not present

## 2017-03-11 DIAGNOSIS — G8929 Other chronic pain: Secondary | ICD-10-CM | POA: Diagnosis not present

## 2017-03-11 DIAGNOSIS — M25551 Pain in right hip: Secondary | ICD-10-CM | POA: Diagnosis not present

## 2017-03-11 DIAGNOSIS — M7061 Trochanteric bursitis, right hip: Secondary | ICD-10-CM | POA: Diagnosis not present

## 2017-04-14 DIAGNOSIS — M9905 Segmental and somatic dysfunction of pelvic region: Secondary | ICD-10-CM | POA: Diagnosis not present

## 2017-04-14 DIAGNOSIS — M9903 Segmental and somatic dysfunction of lumbar region: Secondary | ICD-10-CM | POA: Diagnosis not present

## 2017-04-14 DIAGNOSIS — M546 Pain in thoracic spine: Secondary | ICD-10-CM | POA: Diagnosis not present

## 2017-04-14 DIAGNOSIS — M5136 Other intervertebral disc degeneration, lumbar region: Secondary | ICD-10-CM | POA: Diagnosis not present

## 2017-04-14 DIAGNOSIS — M461 Sacroiliitis, not elsewhere classified: Secondary | ICD-10-CM | POA: Diagnosis not present

## 2017-04-14 DIAGNOSIS — M9902 Segmental and somatic dysfunction of thoracic region: Secondary | ICD-10-CM | POA: Diagnosis not present

## 2017-05-12 DIAGNOSIS — M9903 Segmental and somatic dysfunction of lumbar region: Secondary | ICD-10-CM | POA: Diagnosis not present

## 2017-05-12 DIAGNOSIS — M9905 Segmental and somatic dysfunction of pelvic region: Secondary | ICD-10-CM | POA: Diagnosis not present

## 2017-05-12 DIAGNOSIS — M9902 Segmental and somatic dysfunction of thoracic region: Secondary | ICD-10-CM | POA: Diagnosis not present

## 2017-05-12 DIAGNOSIS — M461 Sacroiliitis, not elsewhere classified: Secondary | ICD-10-CM | POA: Diagnosis not present

## 2017-05-12 DIAGNOSIS — M5136 Other intervertebral disc degeneration, lumbar region: Secondary | ICD-10-CM | POA: Diagnosis not present

## 2017-05-12 DIAGNOSIS — M546 Pain in thoracic spine: Secondary | ICD-10-CM | POA: Diagnosis not present

## 2017-05-20 DIAGNOSIS — B078 Other viral warts: Secondary | ICD-10-CM | POA: Diagnosis not present

## 2017-05-20 DIAGNOSIS — Z872 Personal history of diseases of the skin and subcutaneous tissue: Secondary | ICD-10-CM | POA: Diagnosis not present

## 2017-05-20 DIAGNOSIS — Z85828 Personal history of other malignant neoplasm of skin: Secondary | ICD-10-CM | POA: Diagnosis not present

## 2017-05-20 DIAGNOSIS — L578 Other skin changes due to chronic exposure to nonionizing radiation: Secondary | ICD-10-CM | POA: Diagnosis not present

## 2017-05-20 DIAGNOSIS — L821 Other seborrheic keratosis: Secondary | ICD-10-CM | POA: Diagnosis not present

## 2017-05-28 ENCOUNTER — Telehealth: Payer: Self-pay

## 2017-05-28 ENCOUNTER — Other Ambulatory Visit: Payer: Self-pay

## 2017-05-28 ENCOUNTER — Encounter: Payer: Self-pay | Admitting: Emergency Medicine

## 2017-05-28 ENCOUNTER — Emergency Department
Admission: EM | Admit: 2017-05-28 | Discharge: 2017-05-28 | Disposition: A | Discharge status: Home / Self Care | Payer: Medicare Other | Attending: Emergency Medicine | Admitting: Emergency Medicine

## 2017-05-28 ENCOUNTER — Emergency Department: Payer: Medicare Other

## 2017-05-28 DIAGNOSIS — R079 Chest pain, unspecified: Secondary | ICD-10-CM | POA: Diagnosis not present

## 2017-05-28 DIAGNOSIS — I10 Essential (primary) hypertension: Secondary | ICD-10-CM | POA: Insufficient documentation

## 2017-05-28 DIAGNOSIS — R0789 Other chest pain: Secondary | ICD-10-CM | POA: Diagnosis not present

## 2017-05-28 DIAGNOSIS — Z79899 Other long term (current) drug therapy: Secondary | ICD-10-CM | POA: Diagnosis not present

## 2017-05-28 LAB — CBC
HCT: 42.6 % (ref 35.0–47.0)
HEMOGLOBIN: 14.2 g/dL (ref 12.0–16.0)
MCH: 29.8 pg (ref 26.0–34.0)
MCHC: 33.5 g/dL (ref 32.0–36.0)
MCV: 89.1 fL (ref 80.0–100.0)
PLATELETS: 253 10*3/uL (ref 150–440)
RBC: 4.78 MIL/uL (ref 3.80–5.20)
RDW: 13.1 % (ref 11.5–14.5)
WBC: 6 10*3/uL (ref 3.6–11.0)

## 2017-05-28 LAB — COMPREHENSIVE METABOLIC PANEL
ALBUMIN: 4.4 g/dL (ref 3.5–5.0)
ALT: 17 U/L (ref 14–54)
ANION GAP: 12 (ref 5–15)
AST: 37 U/L (ref 15–41)
Alkaline Phosphatase: 55 U/L (ref 38–126)
BUN: 16 mg/dL (ref 6–20)
CHLORIDE: 104 mmol/L (ref 101–111)
CO2: 25 mmol/L (ref 22–32)
Calcium: 9.8 mg/dL (ref 8.9–10.3)
Creatinine, Ser: 1.03 mg/dL — ABNORMAL HIGH (ref 0.44–1.00)
GFR calc Af Amer: 57 mL/min — ABNORMAL LOW (ref >60)
GFR calc non Af Amer: 50 mL/min — ABNORMAL LOW (ref >60)
GLUCOSE: 96 mg/dL (ref 65–99)
Potassium: 3.3 mmol/L — ABNORMAL LOW (ref 3.5–5.1)
SODIUM: 141 mmol/L (ref 135–145)
Total Bilirubin: 0.8 mg/dL (ref 0.3–1.2)
Total Protein: 7.5 g/dL (ref 6.5–8.1)

## 2017-05-28 LAB — LIPASE, BLOOD: Lipase: 39 U/L (ref 11–51)

## 2017-05-28 LAB — TROPONIN I

## 2017-05-28 NOTE — Telephone Encounter (Deleted)
Patient calling with L side Chest Pain started 10 min ago and has had before but no longer than 3 min. She is scared and said she has some pressure pain and tightness. No pain shooting down arm and no pain in mid back. No dizziness or NVD. Vision seems normal according to patient. She said she did have breast surgery and it was on L side so she said maybe this is scar tissue pain. I advised ER for Cardiac work up. Patient then said this is nothing really and not emergency issue. I offered [facility] and she said that is still an ER. She kept reporting that she was calling to see if we could tell her it is just pains people get at her age. I explained that most people do not act on these things for that reason of over thinking and not acting. I explained that some issues start this way and after work up are fine and others like the patient in 57s who ended up having Bypass a few hours after I sent him to ER. She said Oh Come On and laughed. I said that she mentioned this is nothing and I can hope for same outcome but that we are recommending ER and she agrees to call family and go since she now reports that I have scared her into going.

## 2017-05-28 NOTE — Telephone Encounter (Signed)
Patient called concerned with recurring chest pain on L side. I put call on hold after she said she was alone at home. I asked PCP advise and he said send her to ER. I asked should I advise 911 and he said not unless she has no one to come and take her. I asked patient can she call someone to take her to ER and she laughed and said this is not that big of a deal. I did tell her many people make that mistake when they should at least get checked out and I told her the last patient a few weeks ago that said those words to me had Bypass with in hours of conversation and had I waited to ask PCP or waited on him to decide if it was a pain or serious issue he may have been gone. She then said she had breast surgery on L side and that is what she thinks is causing it. I explained it could be scar tissue or could be heart issues but her PCP recommends she call someone to pick her up or call 911 for ER transport. Patient did say she would call family member but I did not make that choice for her.

## 2017-05-28 NOTE — ED Provider Notes (Signed)
[facility] Emergency Department Provider Note  Time seen: 11:08 AM  I have reviewed the triage vital signs and the nursing notes.   HISTORY  Chief Complaint Chest Pain    HPI Danielle Herrera is a 84 y.o. female with a past medical history of hypertension, hyperlipidemia, normal pressure hydrocephalus, presents to the emergency department for chest discomfort.  According to the patient over the past several weeks she has had 3 or 4 episodes of central to slightly left-sided chest discomfort.  States it is very mild, denies any inciting events, denies any worsening with exertion or deep breaths.  Denies any recent cough, congestion or fever.  Denies shortness of breath nausea or diaphoresis.  Patient states over the past few weeks when it has occurred has resolved within several minutes.  She states today it has been more constant.  Patient states the discomfort is worse if she pushes on the area.  Denies any leg pain.  Largely negative review of systems.   Past Medical History:  Diagnosis Date  . Allergy   . Arthritis of knee, left 01/20/2010  . Esophageal reflux 08/13/2014  . Goiter, nontoxic, multinodular 05/23/2012  . H/O jaundice   . 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 HLD (hyperlipidemia) 08/13/2014  . Hyperparathyroidism, primary ([place]) 08/02/2013  . Hypertension   . Osteopenia 01/20/2010  . Osteoporosis   . Thyroid disease   . Ventricular shunt in place 08/13/2014  . Vitamin D deficiency 08/13/2014    Patient Active Problem List   Diagnosis Date Noted  . Trochanteric bursitis of right hip 01/12/2017  . Constipation 08/11/2016  . Pseudophakia of right eye 12/12/2015  . S/P parathyroidectomy ([place]) 07/10/2015  . Hyperthyroidism, subclinical 04/22/2015  . Polypharmacy 02/19/2015  . 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 HLD (hyperlipidemia) 08/13/2014  . Vitamin D deficiency 08/13/2014  . Ventricular shunt in place 08/13/2014  . Esophageal reflux 08/13/2014  .  Normal pressure hydrocephalus 11/05/2012  . Abnormal gait 08/26/2012  . Goiter, nontoxic, multinodular 05/23/2012  . Acoustic neuroma ([place]) 01/20/2010  . Colon polyp 01/20/2010  . Dependent edema 01/20/2010  . Essential (primary) hypertension 01/20/2010  . Arthritis of knee, left 01/20/2010  . Osteopenia 01/20/2010    Past Surgical History:  Procedure Laterality Date  . ACOUSTIC NEUROMA RESECTION Right 1985  . APPENDECTOMY    . BREAST SURGERY    . COLON SURGERY    . EYE SURGERY     cataract  . hydrcephalic Right 1027  . PARATHYROIDECTOMY     partial removal of parathyroid gland  . SHOULDER ARTHROSCOPY W/ ACROMIAL REPAIR Right 90yrs ago  . TUBAL LIGATION      Prior to Admission medications   Medication Sig Start Date End Date Taking? Authorizing Provider  amLODipine (NORVASC) 10 MG tablet Take 1 tablet (10 mg total) by mouth daily. 02/01/17   Plonk, Gwyndolyn Saxon, MD  calcium carbonate (TUMS EX) 750 MG chewable tablet Chew 1 tablet by mouth 2 (two) times daily. 06/13/15   [provider]  Carboxymethylcellul-Glycerin (OPTIVE) 0.5-0.9 % SOLN 1 drop daily as needed. 11/18/11   [provider]  Cholecalciferol (VITAMIN D) 2000 UNITS CAPS Take 1 capsule (2,000 Units total) by mouth daily. 08/13/14   Plonk, Gwyndolyn Saxon, MD  furosemide (LASIX) 20 MG tablet Take 1 tablet (20 mg total) by mouth daily. 02/09/17   Plonk, Gwyndolyn Saxon, MD  lisinopril (PRINIVIL,ZESTRIL) 10 MG tablet Take 1 tablet (10 mg total) by mouth daily. 02/01/17 02/01/18  Adline Potter, MD  methimazole (  TAPAZOLE) 5 MG tablet TAKE 1 TABLET (5 MG TOTAL) BY MOUTH DAILY. 04/07/16   Adline Potter, MD  Multiple Vitamins-Minerals (CENTRUM SILVER ULTRA WOMENS) TABS Take by mouth. 11/18/11   [provider]  ranitidine (ZANTAC) 150 MG tablet Take 1 tablet (150 mg total) by mouth as needed for heartburn. 08/10/16   Plonk, Gwyndolyn Saxon, MD  sennosides-docusate sodium (SENOKOT-S) 8.6-50 MG tablet Take by mouth. 09/18/13   [provider]    Allergies  Allergen Reactions  . Tylenol [Acetaminophen] Other (See Comments)    Elevated LFTs    Family History  Problem Relation Age of Onset  . Hypertension Mother   . Mental illness Mother   . Heart disease Father   . Hypertension Father   . Diabetes Sister   . Stroke Brother     Social History Social History   Tobacco Use  . Smoking status: Never Smoker  . Smokeless tobacco: Never Used  Substance Use Topics  . Alcohol use: Yes    Alcohol/week: 1.8 oz    Types: 1 Standard drinks or equivalent, 2 Glasses of wine per week  . Drug use: No    Review of Systems Constitutional: Negative for fever. Eyes: Negative for visual complaints ENT: Negative for recent illness/congestion Cardiovascular: Mild central to left-sided chest discomfort. Respiratory: Negative for shortness of breath.  Negative for cough. Gastrointestinal: Negative for abdominal pain or nausea. Genitourinary: Negative for urinary compaints Musculoskeletal: Negative for leg pain. Skin: Negative for skin complaints  Neurological: Negative for headache All other ROS negative  ____________________________________________   PHYSICAL EXAM:  VITAL SIGNS: ED Triage Vitals  Enc Vitals Group     BP 05/28/17 1048 140/81     Pulse Rate 05/28/17 1048 79     Resp 05/28/17 1048 16     Temp 05/28/17 1048 (!) 97.5 F (36.4 C)     Temp Source 05/28/17 1048 Oral     SpO2 05/28/17 1048 99 %     Weight 05/28/17 1047 130 lb (59 kg)     Height 05/28/17 1047 5' 1.5" (1.562 m)     Head Circumference --      Peak Flow --      Pain Score 05/28/17 1047 4     Pain Loc --      Pain Edu? --      Excl. in [place]? --    Constitutional: Alert and oriented. Well appearing and in no distress. Eyes: Normal exam ENT   Head: Normocephalic and atraumatic   Mouth/Throat: Mucous membranes are moist. Cardiovascular: Normal rate, regular rhythm. No murmur Respiratory: Normal respiratory effort without  tachypnea nor retractions. Breath sounds are clear.  Mild central to left-sided chest tenderness to palpation. Gastrointestinal: Soft and nontender. No distention. Musculoskeletal: Nontender with normal range of motion in all extremities. No lower extremity tenderness  Neurologic:  Normal speech and language. No gross focal neurologic deficits Skin:  Skin is warm, dry and intact.  Psychiatric: Mood and affect are normal.  ____________________________________________    EKG  EKG reviewed and interpreted by myself shows normal sinus rhythm 86 bpm with a narrow QRS, normal axis, largely normal intervals with nonspecific but no concerning ST changes.  ____________________________________________    RADIOLOGY  Chest x-ray normal  ____________________________________________   INITIAL IMPRESSION / ASSESSMENT AND PLAN / ED COURSE  Pertinent labs & imaging results that were available during my care of the patient were reviewed by me and considered in my medical decision making (see chart for  details).  Patient presents to the emergency department for intermittent central left-sided chest discomfort over the past several weeks but more constant today.  Patient states the pain is worse if she pushes on the chest, it is reproducible on exam.  Differential would include chest wall pain, musculoskeletal pain, ACS, pneumonia, pneumothorax.  We will check labs including cardiac enzymes, chest x-ray.  EKG shows acutely concerning findings.  X-ray is normal.  Labs are largely within normal limits including negative troponin.  Overall the patient appears extremely well, reproducible discomfort on exam.  I discussed with the patient follow-up with cardiology for stress test as well as my normal chest pain return precautions.  Patient agreeable with this plan of care.  ____________________________________________   FINAL CLINICAL IMPRESSION(S) / ED DIAGNOSES  Chest pain    Harvest Dark,  MD 05/28/17 1205

## 2017-05-28 NOTE — ED Triage Notes (Signed)
C/O left sided chest pain.  Onset of symptoms this morning.  States has had several similar episodes of chest pain over the past several weeks, but symptoms typically resolve.  They persist today.

## 2017-05-28 NOTE — Discharge Instructions (Addendum)
You have been seen in the emergency department today for chest pain. Your workup has shown normal results. As we discussed please follow-up with your primary care physician in the next 1-2 days for recheck. Return to the emergency department for any further chest pain, trouble breathing, or any other symptom personally concerning to yourself. °

## 2017-05-31 ENCOUNTER — Telehealth: Payer: Self-pay

## 2017-05-31 ENCOUNTER — Other Ambulatory Visit: Payer: Self-pay | Admitting: Family Medicine

## 2017-05-31 DIAGNOSIS — R0789 Other chest pain: Secondary | ICD-10-CM

## 2017-05-31 NOTE — Telephone Encounter (Signed)
Will be going with Halina Maidens for PCP. Wants to be seen in [facility] for Cardiology was told Sophronia Simas is good but will go with anyone in [place],

## 2017-05-31 NOTE — Telephone Encounter (Signed)
Patient was told by ER to F/U with PCP. They said she will need referral to Cardio for stress test. When do you want her seen? She has appt May 13  But not aware that you are leaving.

## 2017-05-31 NOTE — Telephone Encounter (Signed)
Pt calling stating she is going to another provider for her ED fu  She is having stress test this week.   Nothing else needed

## 2017-05-31 NOTE — Telephone Encounter (Signed)
Will enter referral.

## 2017-05-31 NOTE — Telephone Encounter (Signed)
I can see before I leave or we can just refer to cards, pt preference.

## 2017-06-02 DIAGNOSIS — R079 Chest pain, unspecified: Secondary | ICD-10-CM | POA: Diagnosis not present

## 2017-06-02 DIAGNOSIS — E7800 Pure hypercholesterolemia, unspecified: Secondary | ICD-10-CM | POA: Diagnosis not present

## 2017-06-02 DIAGNOSIS — I10 Essential (primary) hypertension: Secondary | ICD-10-CM | POA: Diagnosis not present

## 2017-06-15 DIAGNOSIS — M5136 Other intervertebral disc degeneration, lumbar region: Secondary | ICD-10-CM | POA: Diagnosis not present

## 2017-06-15 DIAGNOSIS — M546 Pain in thoracic spine: Secondary | ICD-10-CM | POA: Diagnosis not present

## 2017-06-15 DIAGNOSIS — M9902 Segmental and somatic dysfunction of thoracic region: Secondary | ICD-10-CM | POA: Diagnosis not present

## 2017-06-15 DIAGNOSIS — M9903 Segmental and somatic dysfunction of lumbar region: Secondary | ICD-10-CM | POA: Diagnosis not present

## 2017-06-15 DIAGNOSIS — M9905 Segmental and somatic dysfunction of pelvic region: Secondary | ICD-10-CM | POA: Diagnosis not present

## 2017-06-15 DIAGNOSIS — M461 Sacroiliitis, not elsewhere classified: Secondary | ICD-10-CM | POA: Diagnosis not present

## 2017-07-07 DIAGNOSIS — M7061 Trochanteric bursitis, right hip: Secondary | ICD-10-CM | POA: Diagnosis not present

## 2017-07-07 DIAGNOSIS — M76891 Other specified enthesopathies of right lower limb, excluding foot: Secondary | ICD-10-CM | POA: Diagnosis not present

## 2017-07-07 DIAGNOSIS — G8929 Other chronic pain: Secondary | ICD-10-CM | POA: Diagnosis not present

## 2017-07-07 DIAGNOSIS — M25551 Pain in right hip: Secondary | ICD-10-CM | POA: Diagnosis not present

## 2017-07-08 ENCOUNTER — Other Ambulatory Visit: Payer: Self-pay | Admitting: Sports Medicine

## 2017-07-08 DIAGNOSIS — M25551 Pain in right hip: Principal | ICD-10-CM

## 2017-07-08 DIAGNOSIS — G8929 Other chronic pain: Secondary | ICD-10-CM

## 2017-07-11 ENCOUNTER — Encounter: Payer: Self-pay | Admitting: Internal Medicine

## 2017-07-11 ENCOUNTER — Other Ambulatory Visit: Payer: Self-pay | Admitting: Internal Medicine

## 2017-07-11 DIAGNOSIS — E042 Nontoxic multinodular goiter: Secondary | ICD-10-CM

## 2017-07-12 ENCOUNTER — Encounter: Payer: Self-pay | Admitting: Internal Medicine

## 2017-07-12 ENCOUNTER — Ambulatory Visit: Payer: Medicare Other | Admitting: Family Medicine

## 2017-07-12 ENCOUNTER — Ambulatory Visit (INDEPENDENT_AMBULATORY_CARE_PROVIDER_SITE_OTHER): Payer: Medicare Other | Admitting: Internal Medicine

## 2017-07-12 VITALS — BP 132/82 | HR 84 | Resp 16 | Ht 61.5 in | Wt 136.0 lb

## 2017-07-12 DIAGNOSIS — G4700 Insomnia, unspecified: Secondary | ICD-10-CM | POA: Insufficient documentation

## 2017-07-12 DIAGNOSIS — M7061 Trochanteric bursitis, right hip: Secondary | ICD-10-CM

## 2017-07-12 DIAGNOSIS — I10 Essential (primary) hypertension: Secondary | ICD-10-CM | POA: Diagnosis not present

## 2017-07-12 DIAGNOSIS — F5101 Primary insomnia: Secondary | ICD-10-CM | POA: Diagnosis not present

## 2017-07-12 DIAGNOSIS — K219 Gastro-esophageal reflux disease without esophagitis: Secondary | ICD-10-CM

## 2017-07-12 DIAGNOSIS — E0590 Thyrotoxicosis, unspecified without thyrotoxic crisis or storm: Secondary | ICD-10-CM

## 2017-07-12 NOTE — Patient Instructions (Signed)
Trial of Melatonin for sleep - up to 10 mg.

## 2017-07-12 NOTE — Progress Notes (Signed)
Date:  07/12/2017   Name:  Danielle Herrera   DOB:  1933-08-13   MRN:  [id number]   Chief Complaint: Hypertension ([place] Patient ); Insomnia (was taking benadryl nightly to sleep and stopped to see how she would do and is having issues. Can she do Aleve PM? ); and Hip Pain (MRI is ordered by Ortho but she has questions related to getting MRI but we will not be following for this. ) Hypertension  This is a chronic problem. The problem is controlled. Pertinent negatives include no chest pain, headaches, palpitations or shortness of breath. Past treatments include diuretics, ACE inhibitors and calcium channel blockers. The current treatment provides significant improvement.  Gastroesophageal Reflux  She complains of heartburn. She reports no abdominal pain, no chest pain, no coughing or no wheezing. This is a recurrent problem. The problem occurs rarely. Pertinent negatives include no fatigue. She has tried a histamine-2 antagonist (zantac started after nexium stopped) for the symptoms.  Hip Pain   There was no injury mechanism. The pain is present in the right hip. The quality of the pain is described as aching and shooting. The pain is moderate.  Insomnia  Primary symptoms: sleep disturbance.  The problem occurs nightly. The problem is unchanged. Treatments tried: benadryl.  Hx Vitamin D def - on supplementation, last lab 12/2016 = 67 at [facility].    Review of Systems  Constitutional: Negative for chills, fatigue and fever.  Respiratory: Negative for cough, chest tightness, shortness of breath and wheezing.   Cardiovascular: Negative for chest pain and palpitations.  Gastrointestinal: Positive for heartburn. Negative for abdominal pain, constipation and diarrhea.  Musculoskeletal: Positive for arthralgias.  Allergic/Immunologic: Negative for environmental allergies.  Neurological: Negative for dizziness and headaches.  Psychiatric/Behavioral: Positive for sleep disturbance. The patient  has insomnia.     Patient Active Problem List   Diagnosis Date Noted  . Insomnia 07/12/2017  . Trochanteric bursitis of right hip 01/12/2017  . Constipation 08/11/2016  . Pseudophakia of right eye 12/12/2015  . History of parathyroidectomy 07/10/2015  . Hyperthyroidism, subclinical 04/22/2015  . Polypharmacy 02/19/2015  . 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 Hyperlipidemia, mixed 08/13/2014  . Vitamin D deficiency 08/13/2014  . Ventricular shunt in place 08/13/2014  . Esophageal reflux 08/13/2014  . Normal pressure hydrocephalus 11/05/2012  . Abnormal gait 08/26/2012  . Goiter, nontoxic, multinodular 05/23/2012  . History of acoustic neuroma 01/20/2010  . Colon polyp 01/20/2010  . Dependent edema 01/20/2010  . Essential (primary) hypertension 01/20/2010  . Arthritis of knee, left 01/20/2010  . Osteopenia 01/20/2010    Prior to Admission medications   Medication Sig Start Date End Date Taking? Authorizing Provider  amLODipine (NORVASC) 10 MG tablet Take 1 tablet (10 mg total) by mouth daily. 02/01/17  Yes Plonk, Gwyndolyn Saxon, MD  Calcium Carbonate-Vitamin D (CALCIUM 600+D HIGH POTENCY) 600-400 MG-UNIT tablet Take 1 tablet by mouth 2 (two) times daily.   Yes [provider]  carboxymethylcellul-glycerin (OPTIVE) 0.5-0.9 % ophthalmic solution 1 drop daily as needed. 11/18/11  Yes [provider]  Cholecalciferol (VITAMIN D) 2000 UNITS CAPS Take 1 capsule (2,000 Units total) by mouth daily. 08/13/14  Yes Plonk, Gwyndolyn Saxon, MD  furosemide (LASIX) 20 MG tablet Take 1 tablet (20 mg total) by mouth daily. 02/09/17  Yes Plonk, Gwyndolyn Saxon, MD  lisinopril (PRINIVIL,ZESTRIL) 10 MG tablet Take 1 tablet (10 mg total) by mouth daily. 02/01/17 02/01/18 Yes Plonk, Gwyndolyn Saxon, MD  meloxicam (MOBIC) 7.5 MG tablet Take 7.5 mg by mouth daily  as needed. 10/29/16  Yes [provider]  methimazole (TAPAZOLE) 5 MG tablet TAKE 1 TABLET (5 MG TOTAL) BY MOUTH DAILY. 04/07/16  Yes Plonk, Gwyndolyn Saxon, MD    Multiple Vitamins-Minerals (CENTRUM SILVER ULTRA WOMENS) TABS Take 1 tablet by mouth daily.  11/18/11  Yes [provider]  ranitidine (ZANTAC) 150 MG tablet Take 1 tablet (150 mg total) by mouth as needed for heartburn. 08/10/16  Yes Plonk, Gwyndolyn Saxon, MD  sennosides-docusate sodium (SENOKOT-S) 8.6-50 MG tablet Take 1 tablet by mouth daily.  09/18/13  Yes [provider]    Allergies  Allergen Reactions  . Tylenol [Acetaminophen] Other (See Comments)    Elevated LFTs    Past Surgical History:  Procedure Laterality Date  . ACOUSTIC NEUROMA RESECTION Right 1985  . APPENDECTOMY    . BREAST SURGERY    . COLON SURGERY    . EYE SURGERY     cataract  . PARATHYROIDECTOMY  2017   partial for hyperparathyroidism  . SHOULDER ARTHROSCOPY W/ ACROMIAL REPAIR Right 2yrs ago  . TUBAL LIGATION    . VENTRICULOPERITONEAL SHUNT Right 2014   for NPH    Social History   Tobacco Use  . Smoking status: Never Smoker  . Smokeless tobacco: Never Used  Substance Use Topics  . Alcohol use: Yes    Alcohol/week: 1.8 oz    Types: 1 Standard drinks or equivalent, 2 Glasses of wine per week  . Drug use: No     Medication list has been reviewed and updated.  PHQ 2/9 Scores 07/12/2017 07/20/2016 07/20/2016 07/10/2015  PHQ - 2 Score 0 0 0 0  PHQ- 9 Score - 1 - -    Physical Exam  Constitutional: She is oriented to person, place, and time. She appears well-developed. No distress.  HENT:  Head: Normocephalic and atraumatic.  Neck: Normal range of motion. Neck supple.  Cardiovascular: Normal rate, regular rhythm and normal heart sounds.  Pulmonary/Chest: Effort normal and breath sounds normal. No respiratory distress.  Abdominal: Soft.  Musculoskeletal:       Right hip: She exhibits decreased range of motion and tenderness.       Left hip: Normal.  Lymphadenopathy:    She has no cervical adenopathy.  Neurological: She is alert and oriented to person, place, and time. She has normal  strength.  Skin: Skin is warm and dry. No rash noted.  Psychiatric: She has a normal mood and affect. Her behavior is normal. Thought content normal.  Nursing note and vitals reviewed.   BP 132/82   Pulse 84   Resp 16   Ht 5' 1.5" (1.562 m)   Wt 136 lb (61.7 kg)   SpO2 96%   BMI 25.28 kg/m   Assessment and Plan: 1. Essential (primary) hypertension controlled  2. Hyperthyroidism, subclinical Followed by endo - appt next month  3. Gastroesophageal reflux disease without esophagitis Sx well controlled on Zantac  4. Trochanteric bursitis of right hip Follow up with Ortho after MRI (should be able to do - [facility]  5. Primary insomnia Recommend Melatonin rather than benadryl   No orders of the defined types were placed in this encounter.   Partially dictated using Editor, commissioning. Any errors are unintentional.  Halina Maidens, MD [facility]  07/12/2017

## 2017-07-14 DIAGNOSIS — M461 Sacroiliitis, not elsewhere classified: Secondary | ICD-10-CM | POA: Diagnosis not present

## 2017-07-14 DIAGNOSIS — M9905 Segmental and somatic dysfunction of pelvic region: Secondary | ICD-10-CM | POA: Diagnosis not present

## 2017-07-14 DIAGNOSIS — M9903 Segmental and somatic dysfunction of lumbar region: Secondary | ICD-10-CM | POA: Diagnosis not present

## 2017-07-14 DIAGNOSIS — M5136 Other intervertebral disc degeneration, lumbar region: Secondary | ICD-10-CM | POA: Diagnosis not present

## 2017-07-14 DIAGNOSIS — M546 Pain in thoracic spine: Secondary | ICD-10-CM | POA: Diagnosis not present

## 2017-07-14 DIAGNOSIS — M9902 Segmental and somatic dysfunction of thoracic region: Secondary | ICD-10-CM | POA: Diagnosis not present

## 2017-07-23 DIAGNOSIS — G8929 Other chronic pain: Secondary | ICD-10-CM | POA: Diagnosis not present

## 2017-07-23 DIAGNOSIS — M7061 Trochanteric bursitis, right hip: Secondary | ICD-10-CM | POA: Diagnosis not present

## 2017-07-23 DIAGNOSIS — M76891 Other specified enthesopathies of right lower limb, excluding foot: Secondary | ICD-10-CM | POA: Diagnosis not present

## 2017-07-23 DIAGNOSIS — M25551 Pain in right hip: Secondary | ICD-10-CM | POA: Diagnosis not present

## 2017-07-27 DIAGNOSIS — H9190 Unspecified hearing loss, unspecified ear: Secondary | ICD-10-CM | POA: Diagnosis not present

## 2017-07-27 DIAGNOSIS — H6123 Impacted cerumen, bilateral: Secondary | ICD-10-CM | POA: Diagnosis not present

## 2017-08-02 DIAGNOSIS — E0590 Thyrotoxicosis, unspecified without thyrotoxic crisis or storm: Secondary | ICD-10-CM | POA: Diagnosis not present

## 2017-08-02 DIAGNOSIS — E210 Primary hyperparathyroidism: Secondary | ICD-10-CM | POA: Diagnosis not present

## 2017-08-02 DIAGNOSIS — E042 Nontoxic multinodular goiter: Secondary | ICD-10-CM | POA: Diagnosis not present

## 2017-08-02 DIAGNOSIS — E049 Nontoxic goiter, unspecified: Secondary | ICD-10-CM | POA: Diagnosis not present

## 2017-08-02 DIAGNOSIS — E0520 Thyrotoxicosis with toxic multinodular goiter without thyrotoxic crisis or storm: Secondary | ICD-10-CM | POA: Diagnosis not present

## 2017-08-03 DIAGNOSIS — G8929 Other chronic pain: Secondary | ICD-10-CM | POA: Diagnosis not present

## 2017-08-03 DIAGNOSIS — M25551 Pain in right hip: Secondary | ICD-10-CM | POA: Diagnosis not present

## 2017-08-04 DIAGNOSIS — L700 Acne vulgaris: Secondary | ICD-10-CM | POA: Diagnosis not present

## 2017-08-20 DIAGNOSIS — M9905 Segmental and somatic dysfunction of pelvic region: Secondary | ICD-10-CM | POA: Diagnosis not present

## 2017-08-20 DIAGNOSIS — M9903 Segmental and somatic dysfunction of lumbar region: Secondary | ICD-10-CM | POA: Diagnosis not present

## 2017-08-20 DIAGNOSIS — M5136 Other intervertebral disc degeneration, lumbar region: Secondary | ICD-10-CM | POA: Diagnosis not present

## 2017-08-20 DIAGNOSIS — M9902 Segmental and somatic dysfunction of thoracic region: Secondary | ICD-10-CM | POA: Diagnosis not present

## 2017-08-20 DIAGNOSIS — M461 Sacroiliitis, not elsewhere classified: Secondary | ICD-10-CM | POA: Diagnosis not present

## 2017-08-20 DIAGNOSIS — M546 Pain in thoracic spine: Secondary | ICD-10-CM | POA: Diagnosis not present

## 2017-08-24 DIAGNOSIS — M25551 Pain in right hip: Secondary | ICD-10-CM | POA: Diagnosis not present

## 2017-08-24 DIAGNOSIS — M6281 Muscle weakness (generalized): Secondary | ICD-10-CM | POA: Diagnosis not present

## 2017-08-24 DIAGNOSIS — G8929 Other chronic pain: Secondary | ICD-10-CM | POA: Diagnosis not present

## 2017-08-24 DIAGNOSIS — R269 Unspecified abnormalities of gait and mobility: Secondary | ICD-10-CM | POA: Diagnosis not present

## 2017-09-01 DIAGNOSIS — M25551 Pain in right hip: Secondary | ICD-10-CM | POA: Diagnosis not present

## 2017-09-01 DIAGNOSIS — G8929 Other chronic pain: Secondary | ICD-10-CM | POA: Diagnosis not present

## 2017-09-09 DIAGNOSIS — M76891 Other specified enthesopathies of right lower limb, excluding foot: Secondary | ICD-10-CM | POA: Diagnosis not present

## 2017-09-29 DIAGNOSIS — M9905 Segmental and somatic dysfunction of pelvic region: Secondary | ICD-10-CM | POA: Diagnosis not present

## 2017-09-29 DIAGNOSIS — M461 Sacroiliitis, not elsewhere classified: Secondary | ICD-10-CM | POA: Diagnosis not present

## 2017-09-29 DIAGNOSIS — M546 Pain in thoracic spine: Secondary | ICD-10-CM | POA: Diagnosis not present

## 2017-09-29 DIAGNOSIS — M5136 Other intervertebral disc degeneration, lumbar region: Secondary | ICD-10-CM | POA: Diagnosis not present

## 2017-09-29 DIAGNOSIS — M9902 Segmental and somatic dysfunction of thoracic region: Secondary | ICD-10-CM | POA: Diagnosis not present

## 2017-09-29 DIAGNOSIS — M9903 Segmental and somatic dysfunction of lumbar region: Secondary | ICD-10-CM | POA: Diagnosis not present

## 2017-10-19 DIAGNOSIS — M76891 Other specified enthesopathies of right lower limb, excluding foot: Secondary | ICD-10-CM | POA: Diagnosis not present

## 2017-10-21 DIAGNOSIS — M7061 Trochanteric bursitis, right hip: Secondary | ICD-10-CM | POA: Diagnosis not present

## 2017-10-25 ENCOUNTER — Other Ambulatory Visit: Payer: Self-pay

## 2017-10-25 ENCOUNTER — Encounter
Admission: RE | Admit: 2017-10-25 | Discharge: 2017-10-25 | Disposition: A | Discharge status: Home / Self Care | Payer: Medicare Other | Source: Ambulatory Visit | Attending: Orthopedic Surgery | Admitting: Orthopedic Surgery

## 2017-10-25 DIAGNOSIS — M7061 Trochanteric bursitis, right hip: Secondary | ICD-10-CM | POA: Insufficient documentation

## 2017-10-25 DIAGNOSIS — Z01818 Encounter for other preprocedural examination: Secondary | ICD-10-CM | POA: Insufficient documentation

## 2017-10-25 HISTORY — DX: Inflammatory liver disease, unspecified: K75.9

## 2017-10-25 HISTORY — DX: Nontoxic multinodular goiter: E04.2

## 2017-10-25 HISTORY — DX: Pneumonia, unspecified organism: J18.9

## 2017-10-25 HISTORY — DX: Diffuse cystic mastopathy of unspecified breast: N60.19

## 2017-10-25 HISTORY — DX: (Idiopathic) normal pressure hydrocephalus: G91.2

## 2017-10-25 HISTORY — DX: Thyrotoxicosis, unspecified without thyrotoxic crisis or storm: E05.90

## 2017-10-25 LAB — BASIC METABOLIC PANEL
Anion gap: 9 (ref 5–15)
BUN: 16 mg/dL (ref 8–23)
CALCIUM: 9.6 mg/dL (ref 8.9–10.3)
CO2: 28 mmol/L (ref 22–32)
CREATININE: 0.99 mg/dL (ref 0.44–1.00)
Chloride: 104 mmol/L (ref 98–111)
GFR, EST NON AFRICAN AMERICAN: 52 mL/min — AB (ref >60)
GLUCOSE: 88 mg/dL (ref 70–99)
Potassium: 3.8 mmol/L (ref 3.5–5.1)
Sodium: 141 mmol/L (ref 135–145)

## 2017-10-25 LAB — CBC
HCT: 42.1 % (ref 35.0–47.0)
Hemoglobin: 14.4 g/dL (ref 12.0–16.0)
MCH: 30.4 pg (ref 26.0–34.0)
MCHC: 34.1 g/dL (ref 32.0–36.0)
MCV: 89.2 fL (ref 80.0–100.0)
PLATELETS: 272 10*3/uL (ref 150–440)
RBC: 4.72 MIL/uL (ref 3.80–5.20)
RDW: 13.4 % (ref 11.5–14.5)
WBC: 6.5 10*3/uL (ref 3.6–11.0)

## 2017-10-25 NOTE — Patient Instructions (Signed)
Your procedure is scheduled on: Friday, October 29, 2017 Report to Day Surgery on the 2nd floor of the [facility]. To find out your arrival time, please call [phone] between 1PM - 3PM on: Thursday, October 28, 2017  REMEMBER: Instructions that are not followed completely may result in serious medical risk, up to and including death; or upon the discretion of your surgeon and anesthesiologist your surgery may need to be rescheduled.  Do not eat food after midnight the night before surgery.  No gum chewing, lozengers or hard candies.  You may however, drink CLEAR liquids up to 2 hours before you are scheduled to arrive for your surgery. Do not drink anything within 2 hours of the start of your surgery.  Clear liquids include: - water  - apple juice without pulp - gatorade - black coffee or tea (Do NOT add milk or creamers to the coffee or tea) Do NOT drink anything that is not on this list.  No Alcohol for 24 hours before or after surgery.  No Smoking including e-cigarettes for 24 hours prior to surgery.  No chewable tobacco products for at least 6 hours prior to surgery.  No nicotine patches on the day of surgery.  On the morning of surgery brush your teeth with toothpaste and water, you may rinse your mouth with mouthwash if you wish. Do not swallow any toothpaste or mouthwash.  Notify your doctor if there is any change in your medical condition (cold, fever, infection).  Do not wear jewelry, make-up, hairpins, clips or nail polish.  Do not wear lotions, powders, or perfumes. You may wear deodorant.  Do not shave 48 hours prior to surgery.   Contacts and dentures may not be worn into surgery.  Do not bring valuables to the hospital, including drivers license, insurance or credit cards.  [facility] is not responsible for any belongings or valuables.   TAKE THESE MEDICATIONS THE MORNING OF SURGERY:  1.  AMLODIPINE 2.  RANITIDINE (TAKE ONE THE NIGHT BEFORE SURGERY AND  ONE THE MORNING OF SURGERY - HELPS TO PREVENT NAUSEA AFTER SURGERY) 3.  METHIMAZOLE  Use CHG Soap as directed on instruction sheet.  NOW!  Stop Anti-inflammatories (NSAIDS) such as MELOXICAM, Advil, Aleve, Ibuprofen, Motrin, Naproxen, Naprosyn and Aspirin based products such as Excedrin, Goodys Powder, BC Powder. (May take Tylenol or Acetaminophen if needed.)  NOW!  Stop ANY OVER THE COUNTER supplements until after surgery. (May continue Vitamin D, Vitamin B, and multivitamin.)  Wear comfortable clothing (specific to your surgery type) to the hospital.  Plan for stool softeners for home use.  If you are being discharged the day of surgery, you will not be allowed to drive home. You will need a responsible adult to drive you home and stay with you that night.   If you are taking public transportation, you will need to have a responsible adult with you. Please confirm with your physician that it is acceptable to use public transportation.   Please call [phone] if you have any questions about these instructions.

## 2017-10-27 DIAGNOSIS — M9903 Segmental and somatic dysfunction of lumbar region: Secondary | ICD-10-CM | POA: Diagnosis not present

## 2017-10-27 DIAGNOSIS — M9902 Segmental and somatic dysfunction of thoracic region: Secondary | ICD-10-CM | POA: Diagnosis not present

## 2017-10-27 DIAGNOSIS — M9905 Segmental and somatic dysfunction of pelvic region: Secondary | ICD-10-CM | POA: Diagnosis not present

## 2017-10-27 DIAGNOSIS — M546 Pain in thoracic spine: Secondary | ICD-10-CM | POA: Diagnosis not present

## 2017-10-27 DIAGNOSIS — M5136 Other intervertebral disc degeneration, lumbar region: Secondary | ICD-10-CM | POA: Diagnosis not present

## 2017-10-27 DIAGNOSIS — M461 Sacroiliitis, not elsewhere classified: Secondary | ICD-10-CM | POA: Diagnosis not present

## 2017-10-28 MED ORDER — CEFAZOLIN SODIUM-DEXTROSE 2-4 GM/100ML-% IV SOLN
2.0000 g | Freq: Once | INTRAVENOUS | Status: AC
  Administered 2017-10-29: 2 g via INTRAVENOUS

## 2017-10-29 ENCOUNTER — Ambulatory Visit
Admission: RE | Admit: 2017-10-29 | Discharge: 2017-10-29 | Disposition: A | Discharge status: Home / Self Care | Payer: Medicare Other | Source: Ambulatory Visit | Attending: Orthopedic Surgery | Admitting: Orthopedic Surgery

## 2017-10-29 ENCOUNTER — Encounter
Admission: RE | Disposition: A | Discharge status: Home / Self Care | Payer: Self-pay | Source: Ambulatory Visit | Attending: Orthopedic Surgery

## 2017-10-29 ENCOUNTER — Ambulatory Visit: Payer: Medicare Other | Admitting: Certified Registered Nurse Anesthetist

## 2017-10-29 DIAGNOSIS — Z8042 Family history of malignant neoplasm of prostate: Secondary | ICD-10-CM | POA: Insufficient documentation

## 2017-10-29 DIAGNOSIS — M7631 Iliotibial band syndrome, right leg: Secondary | ICD-10-CM | POA: Diagnosis not present

## 2017-10-29 DIAGNOSIS — Z833 Family history of diabetes mellitus: Secondary | ICD-10-CM | POA: Diagnosis not present

## 2017-10-29 DIAGNOSIS — Z8249 Family history of ischemic heart disease and other diseases of the circulatory system: Secondary | ICD-10-CM | POA: Insufficient documentation

## 2017-10-29 DIAGNOSIS — N6019 Diffuse cystic mastopathy of unspecified breast: Secondary | ICD-10-CM | POA: Insufficient documentation

## 2017-10-29 DIAGNOSIS — E0590 Thyrotoxicosis, unspecified without thyrotoxic crisis or storm: Secondary | ICD-10-CM | POA: Diagnosis not present

## 2017-10-29 DIAGNOSIS — Z818 Family history of other mental and behavioral disorders: Secondary | ICD-10-CM | POA: Insufficient documentation

## 2017-10-29 DIAGNOSIS — I10 Essential (primary) hypertension: Secondary | ICD-10-CM | POA: Diagnosis not present

## 2017-10-29 DIAGNOSIS — E782 Mixed hyperlipidemia: Secondary | ICD-10-CM | POA: Diagnosis not present

## 2017-10-29 DIAGNOSIS — E785 Hyperlipidemia, unspecified: Secondary | ICD-10-CM | POA: Insufficient documentation

## 2017-10-29 DIAGNOSIS — Z886 Allergy status to analgesic agent status: Secondary | ICD-10-CM | POA: Insufficient documentation

## 2017-10-29 DIAGNOSIS — M810 Age-related osteoporosis without current pathological fracture: Secondary | ICD-10-CM | POA: Diagnosis not present

## 2017-10-29 DIAGNOSIS — M7061 Trochanteric bursitis, right hip: Secondary | ICD-10-CM | POA: Diagnosis not present

## 2017-10-29 DIAGNOSIS — Z90710 Acquired absence of both cervix and uterus: Secondary | ICD-10-CM | POA: Insufficient documentation

## 2017-10-29 DIAGNOSIS — E049 Nontoxic goiter, unspecified: Secondary | ICD-10-CM | POA: Diagnosis not present

## 2017-10-29 DIAGNOSIS — G912 (Idiopathic) normal pressure hydrocephalus: Secondary | ICD-10-CM | POA: Insufficient documentation

## 2017-10-29 DIAGNOSIS — K219 Gastro-esophageal reflux disease without esophagitis: Secondary | ICD-10-CM | POA: Diagnosis not present

## 2017-10-29 DIAGNOSIS — Z8349 Family history of other endocrine, nutritional and metabolic diseases: Secondary | ICD-10-CM | POA: Insufficient documentation

## 2017-10-29 DIAGNOSIS — R569 Unspecified convulsions: Secondary | ICD-10-CM | POA: Insufficient documentation

## 2017-10-29 DIAGNOSIS — Z79899 Other long term (current) drug therapy: Secondary | ICD-10-CM | POA: Insufficient documentation

## 2017-10-29 DIAGNOSIS — Z820 Family history of epilepsy and other diseases of the nervous system: Secondary | ICD-10-CM | POA: Insufficient documentation

## 2017-10-29 HISTORY — PX: EXCISION/RELEASE BURSA HIP: SHX5014

## 2017-10-29 SURGERY — RELEASE, BURSA, TROCHANTERIC
Anesthesia: General | Site: Hip | Laterality: Right | Wound class: "Clean "

## 2017-10-29 MED ORDER — ROCURONIUM BROMIDE 50 MG/5ML IV SOLN
INTRAVENOUS | Status: AC
  Filled 2017-10-29: qty 1

## 2017-10-29 MED ORDER — DEXAMETHASONE SODIUM PHOSPHATE 10 MG/ML IJ SOLN
INTRAMUSCULAR | Status: AC
  Filled 2017-10-29: qty 1

## 2017-10-29 MED ORDER — SUGAMMADEX SODIUM 200 MG/2ML IV SOLN
INTRAVENOUS | Status: AC
  Filled 2017-10-29: qty 2

## 2017-10-29 MED ORDER — DEXAMETHASONE SODIUM PHOSPHATE 10 MG/ML IJ SOLN
INTRAMUSCULAR | Status: DC | PRN
  Administered 2017-10-29: 5 mg via INTRAVENOUS

## 2017-10-29 MED ORDER — LIDOCAINE HCL (CARDIAC) PF 100 MG/5ML IV SOSY
PREFILLED_SYRINGE | INTRAVENOUS | Status: DC | PRN
  Administered 2017-10-29: 60 mg via INTRAVENOUS

## 2017-10-29 MED ORDER — BUPIVACAINE LIPOSOME 1.3 % IJ SUSP
INTRAMUSCULAR | Status: AC
  Filled 2017-10-29: qty 20

## 2017-10-29 MED ORDER — PROPOFOL 500 MG/50ML IV EMUL
INTRAVENOUS | Status: AC
  Filled 2017-10-29: qty 50

## 2017-10-29 MED ORDER — ROCURONIUM BROMIDE 100 MG/10ML IV SOLN
INTRAVENOUS | Status: DC | PRN
  Administered 2017-10-29: 5 mg via INTRAVENOUS
  Administered 2017-10-29: 25 mg via INTRAVENOUS

## 2017-10-29 MED ORDER — SUGAMMADEX SODIUM 200 MG/2ML IV SOLN
INTRAVENOUS | Status: DC | PRN
  Administered 2017-10-29: 120.6 mg via INTRAVENOUS

## 2017-10-29 MED ORDER — OXYCODONE HCL 5 MG PO TABS: 5.0000 mg | ORAL_TABLET | ORAL | 0 refills | Status: DC | PRN

## 2017-10-29 MED ORDER — LACTATED RINGERS IV SOLN
INTRAVENOUS | Status: DC
  Administered 2017-10-29: 07:00:00 via INTRAVENOUS

## 2017-10-29 MED ORDER — BUPIVACAINE LIPOSOME 1.3 % IJ SUSP
INTRAMUSCULAR | Status: DC | PRN
  Administered 2017-10-29: 20 mL

## 2017-10-29 MED ORDER — GLYCOPYRROLATE 0.2 MG/ML IJ SOLN
INTRAMUSCULAR | Status: DC | PRN
  Administered 2017-10-29 (×2): 0.1 mg via INTRAVENOUS

## 2017-10-29 MED ORDER — NEOMYCIN-POLYMYXIN B GU 40-200000 IR SOLN
Status: DC | PRN
  Administered 2017-10-29: 4 mL

## 2017-10-29 MED ORDER — IBUPROFEN 800 MG PO TABS: 800.0000 mg | ORAL_TABLET | Freq: Three times a day (TID) | ORAL | 0 refills | Status: AC

## 2017-10-29 MED ORDER — PHENYLEPHRINE HCL 10 MG/ML IJ SOLN
INTRAMUSCULAR | Status: DC | PRN
  Administered 2017-10-29 (×3): 50 ug via INTRAVENOUS
  Administered 2017-10-29: 100 ug via INTRAVENOUS
  Administered 2017-10-29: 50 ug via INTRAVENOUS

## 2017-10-29 MED ORDER — PROPOFOL 10 MG/ML IV BOLUS
INTRAVENOUS | Status: DC | PRN
  Administered 2017-10-29: 110 mg via INTRAVENOUS

## 2017-10-29 MED ORDER — BUPIVACAINE-EPINEPHRINE (PF) 0.5% -1:200000 IJ SOLN
INTRAMUSCULAR | Status: DC | PRN
  Administered 2017-10-29: 30 mL

## 2017-10-29 MED ORDER — ASPIRIN EC 325 MG PO TBEC: 325.0000 mg | DELAYED_RELEASE_TABLET | Freq: Every day | ORAL | 0 refills | Status: AC

## 2017-10-29 MED ORDER — ONDANSETRON 4 MG PO TBDP: 4.0000 mg | ORAL_TABLET | Freq: Three times a day (TID) | ORAL | 0 refills | Status: DC | PRN

## 2017-10-29 MED ORDER — FENTANYL CITRATE (PF) 100 MCG/2ML IJ SOLN
INTRAMUSCULAR | Status: DC | PRN
  Administered 2017-10-29: 50 ug via INTRAVENOUS
  Administered 2017-10-29 (×2): 25 ug via INTRAVENOUS

## 2017-10-29 MED ORDER — BUPIVACAINE HCL (PF) 0.5 % IJ SOLN
INTRAMUSCULAR | Status: AC
  Filled 2017-10-29: qty 30

## 2017-10-29 MED ORDER — FENTANYL CITRATE (PF) 100 MCG/2ML IJ SOLN
25.0000 ug | INTRAMUSCULAR | Status: AC | PRN
  Administered 2017-10-29 (×6): 25 ug via INTRAVENOUS

## 2017-10-29 MED ORDER — CEFAZOLIN SODIUM-DEXTROSE 2-4 GM/100ML-% IV SOLN
INTRAVENOUS | Status: AC
  Filled 2017-10-29: qty 100

## 2017-10-29 MED ORDER — LIDOCAINE HCL (PF) 2 % IJ SOLN
INTRAMUSCULAR | Status: AC
  Filled 2017-10-29: qty 10

## 2017-10-29 MED ORDER — FENTANYL CITRATE (PF) 100 MCG/2ML IJ SOLN
INTRAMUSCULAR | Status: AC
  Administered 2017-10-29: 25 ug via INTRAVENOUS
  Filled 2017-10-29: qty 2

## 2017-10-29 MED ORDER — NEOMYCIN-POLYMYXIN B GU 40-200000 IR SOLN
Status: AC
  Filled 2017-10-29: qty 20

## 2017-10-29 MED ORDER — ONDANSETRON HCL 4 MG/2ML IJ SOLN
INTRAMUSCULAR | Status: DC | PRN
  Administered 2017-10-29: 4 mg via INTRAVENOUS

## 2017-10-29 MED ORDER — BUPIVACAINE-EPINEPHRINE (PF) 0.5% -1:200000 IJ SOLN
INTRAMUSCULAR | Status: AC
  Filled 2017-10-29: qty 30

## 2017-10-29 MED ORDER — SUCCINYLCHOLINE CHLORIDE 20 MG/ML IJ SOLN
INTRAMUSCULAR | Status: DC | PRN
  Administered 2017-10-29: 80 mg via INTRAVENOUS

## 2017-10-29 MED ORDER — ONDANSETRON HCL 4 MG/2ML IJ SOLN
INTRAMUSCULAR | Status: AC
  Filled 2017-10-29: qty 2

## 2017-10-29 MED ORDER — ONDANSETRON HCL 4 MG/2ML IJ SOLN: 4.0000 mg | Freq: Once | INTRAMUSCULAR | Status: DC | PRN

## 2017-10-29 MED ORDER — FENTANYL CITRATE (PF) 100 MCG/2ML IJ SOLN
INTRAMUSCULAR | Status: AC
  Filled 2017-10-29: qty 2

## 2017-10-29 SURGICAL SUPPLY — 54 items
BLADE SURG SZ20 CARB STEEL (BLADE) ×2 IMPLANT
CANISTER SUCT 1200ML W/VALVE (MISCELLANEOUS) ×2 IMPLANT
CANISTER SUCT 3000ML PPV (MISCELLANEOUS) ×2 IMPLANT
CHLORAPREP W/TINT 26ML (MISCELLANEOUS) ×2 IMPLANT
DERMABOND ADVANCED (GAUZE/BANDAGES/DRESSINGS) ×1
DERMABOND ADVANCED .7 DNX12 (GAUZE/BANDAGES/DRESSINGS) IMPLANT
DRAPE IMP U-DRAPE 54X76 (DRAPES) ×2 IMPLANT
DRAPE INCISE IOBAN 66X60 STRL (DRAPES) ×2 IMPLANT
DRAPE SHEET LG 3/4 BI-LAMINATE (DRAPES) ×1 IMPLANT
DRAPE SURG 17X11 SM STRL (DRAPES) ×4 IMPLANT
DRSG OPSITE POSTOP 4X10 (GAUZE/BANDAGES/DRESSINGS) ×1 IMPLANT
DRSG OPSITE POSTOP 4X12 (GAUZE/BANDAGES/DRESSINGS) ×1 IMPLANT
DRSG OPSITE POSTOP 4X14 (GAUZE/BANDAGES/DRESSINGS) ×1 IMPLANT
ELECT BLADE 6.5 EXT (BLADE) ×2 IMPLANT
ELECT CAUTERY BLADE 6.4 (BLADE) ×2 IMPLANT
GLOVE BIO SURGEON STRL SZ7.5 (GLOVE) ×5 IMPLANT
GLOVE BIO SURGEON STRL SZ8 (GLOVE) ×5 IMPLANT
GLOVE BIOGEL PI IND STRL 8 (GLOVE) ×1 IMPLANT
GLOVE BIOGEL PI INDICATOR 8 (GLOVE) ×1
GLOVE INDICATOR 8.0 STRL GRN (GLOVE) ×2 IMPLANT
GOWN STRL REUS W/ TWL LRG LVL3 (GOWN DISPOSABLE) ×1 IMPLANT
GOWN STRL REUS W/ TWL XL LVL3 (GOWN DISPOSABLE) ×1 IMPLANT
GOWN STRL REUS W/TWL LRG LVL3 (GOWN DISPOSABLE) ×1
GOWN STRL REUS W/TWL XL LVL3 (GOWN DISPOSABLE) ×1
KIT TURNOVER KIT A (KITS) ×2 IMPLANT
NDL FILTER BLUNT 18X1 1/2 (NEEDLE) ×1 IMPLANT
NDL SAFETY ECLIPSE 18X1.5 (NEEDLE) ×2 IMPLANT
NDL SPNL 20GX3.5 QUINCKE YW (NEEDLE) ×1 IMPLANT
NEEDLE FILTER BLUNT 18X 1/2SAF (NEEDLE)
NEEDLE FILTER BLUNT 18X1 1/2 (NEEDLE) IMPLANT
NEEDLE HYPO 18GX1.5 SHARP (NEEDLE)
NEEDLE SPNL 20GX3.5 QUINCKE YW (NEEDLE) IMPLANT
NS IRRIG 1000ML POUR BTL (IV SOLUTION) ×2 IMPLANT
PACK HIP PROSTHESIS (MISCELLANEOUS) ×2 IMPLANT
PULSAVAC PLUS IRRIG FAN TIP (DISPOSABLE)
SOL .9 NS 3000ML IRR  AL (IV SOLUTION)
SOL .9 NS 3000ML IRR UROMATIC (IV SOLUTION) ×1 IMPLANT
SPONGE LAP 18X18 RF (DISPOSABLE) ×1 IMPLANT
STAPLER SKIN PROX 35W (STAPLE) ×1 IMPLANT
SUT MNCRL 4-0 (SUTURE) ×1
SUT MNCRL 4-0 27XMFL (SUTURE) ×1
SUT TICRON 2-0 30IN 311381 (SUTURE) ×3 IMPLANT
SUT VIC AB 0 CT1 36 (SUTURE) ×3 IMPLANT
SUT VIC AB 1 CT1 36 (SUTURE) ×2 IMPLANT
SUT VIC AB 2-0 CT1 (SUTURE) ×4 IMPLANT
SUT VIC AB 2-0 CT2 27 (SUTURE) ×1 IMPLANT
SUTURE MNCRL 4-0 27XMF (SUTURE) IMPLANT
SYR 10ML LL (SYRINGE) ×1 IMPLANT
SYR 20CC LL (SYRINGE) ×1 IMPLANT
SYR 30ML LL (SYRINGE) ×3 IMPLANT
TAPE TRANSPORE STRL 2 31045 (GAUZE/BANDAGES/DRESSINGS) ×2 IMPLANT
TIP BRUSH PULSAVAC PLUS 24.33 (MISCELLANEOUS) ×1 IMPLANT
TIP FAN IRRIG PULSAVAC PLUS (DISPOSABLE) ×1 IMPLANT
WATER STERILE IRR 1000ML POUR (IV SOLUTION) ×1 IMPLANT

## 2017-10-29 NOTE — Discharge Instructions (Signed)
AMBULATORY SURGERY  DISCHARGE INSTRUCTIONS   1) The drugs that you were given will stay in your system until tomorrow so for the next 24 hours you should not:  A) Drive an automobile B) Make any legal decisions C) Drink any alcoholic beverage   2) You may resume regular meals tomorrow.  Today it is better to start with liquids and gradually work up to solid foods.  You may eat anything you prefer, but it is better to start with liquids, then soup and crackers, and gradually work up to solid foods.   3) Please notify your doctor immediately if you have any unusual bleeding, trouble breathing, redness and pain at the surgery site, drainage, fever, or pain not relieved by medication. 4)   5) Your post-operative visit with Dr.                                     is: Date:                        Time:    Please call to schedule your post-operative visit.  6) Additional Instructions:     Open Hip Surgery  Post-Op Instructions  1. Bracing or crutches: Crutches or walker will be provided at the time of discharge from the surgery center. Must use brace for the first 2 weeks at all times except hygiene.   2. Ice: You may be provided with a device [facility]) that allows you to ice the affected area effectively. Otherwise you can ice manually.   3. Driving:  Plan on not driving for at least one week. Please note that you are advised NOT to drive while taking narcotic pain medications as you may be impaired and unsafe to drive.  4. Activity: Ankle pumps several times an hour while awake to prevent blood clots. Weight bearing: FFWB x 1 week, then 50% WB for weeks 1-2. Use crutches or walker for at least 2 weeks. Avoid standing more than 5 minutes (consecutively) for the first week. No exercise involving the hip until cleared by the surgeon or physical therapist.   5. Medications:  - You have been provided a prescription for narcotic pain medicine. After surgery, take 1-2 narcotic  tablets every 4 hours if needed for severe pain.  - A prescription for anti-nausea medication will be provided in case the narcotic medicine causes nausea - take 1 tablet every 6 hours only if nauseated.  - Take ibuprofen 800 mg every 8 hours with food to reduce post-operative swelling and pain.   - Take enteric coated aspirin 325 mg once daily for 2 weeks to prevent blood clots.    If you are taking prescription medication for anxiety, depression, insomnia, muscle spasm, chronic pain, or for attention deficit disorder you are advised that you are at a higher risk of adverse effects with use of narcotics post-op, including narcotic addiction/dependence, depressed breathing, death. If you use non-prescribed substances: alcohol, marijuana, cocaine, heroin, methamphetamines, etc., you are at a higher risk of adverse effects with use of narcotics post-op, including narcotic addiction/dependence, depressed breathing, death. You are advised that taking > 50 morphine milligram equivalents (MME) of narcotic pain medication per day results in twice the risk of overdose or death. For your prescription provided: oxycodone 5 mg - taking more than 6 tablets per day. Be advised that we will prescribe narcotics short-term, for acute  post-operative pain only.  6. Bandages: The physical therapist should change the bandages at the first post-op appointment. If needed, the dressing supplies have been provided to you.  7. Physical Therapy: 1-2 times per week for the first 6-12 weeks. Therapy typically starts on post operative Day 3 or 4. You have been provided an order for physical therapy. The therapist will provide home exercises.  8. Work: May return to full work when off of [employer] and can take up to 4-6 weeks. May do light duty/desk job in approximately 1-2 weeks when off of narcotics, pain is well-controlled.  9. Post-Op Appointments: Your first post-op appointment will be with Dr. Posey Pronto in  approximately 2 weeks time.   If you find that they have not been scheduled please call the Orthopaedic Appointment front desk at [phone].

## 2017-10-29 NOTE — Op Note (Signed)
DATE OF SURGERY:  10/29/2017  PREOPERATIVE DIAGNOSIS:  1. Right hip trochanteric bursitis 2. Right hip Iliotibial band tightness  POSTOPERATIVE DIAGNOSIS:  1. Right hip trochanteric bursitis 2. Right hip Iliotibial band tightness  PROCEDURE:  1. Right hip open trochanteric bursectomy 2. Right hip open Iliotibial band release  SURGEON: Cato Mulligan, MD  ASSISTANTS: none  EBL: 10cc  INDICATIONS: Danielle Herrera is a 84 y.o. female who has had chronic lateral hip pain. The patient has had excellent relief of pain with corticosteroid injections previously, but they do not give her adequate relief any longer. She has had physical therapy without improvement in her symptoms. Pain is significantly affecting quality of life and affects her sleep. Prior MRI showed relatively normal gluteus musculature without significant tear. After discussion of risks, benefits, and alternatives to surgery, the patient elected to proceed with above procedure.   PROCEDURE:  The patient was identified in the preoperative holding area and the operative extremity was marked. The patient was then transferred to the operating room suite and mobilized from the hospital gurney to the operating room table. Anesthesia was administered without complication. The patient was then transitioned to a lateral position. All bony prominences were padded per protocol. Careful attention was paid to the contralateral side peroneal nerve, which was free from pressure with use of appropriate padding and blankets. A time-out was performed to confirm the patient's identity and the correct laterality of surgery. The patient was then prepped and draped in the usual sterile fashion. Appropriate pre-operative antibiotics were administered.  An incision that centered on the posterior tip of the greater trochanter was made.Dissection was carried down through the subcutaneous tissue. Careful attention was made to maintain  hemostasis using electrocautery. Dissection brought [place] to the level of IT band. The IT band was incised in curvilinear fashion over the most prominent aspect of the posterior greater trochanter. The trochanteric bursa was then visualized and was completely excised until short external rotators were completely visualized. Excision was performed with a combination of scissors and electrocautery. There were no osteophytes or bony prominences on the greater trochanter. The gluteus medius insertion was intact.  Care was taken to ensure appropriate hemostasis.   The wound was then thoroughly irrigated. The proximal split of the IT band/gluteus musculature was closed with 0-vicryl until the level of the proximal greater trochanter. The remainder of the split in the IT band was left open over the greater trochanter as this consisted of the IT band release. There was ~2-3cm of gapping between the anterior and posterior IT band fibers at the level of the split. 0-Vicyrl suture was used to close the dead space superficial to the IT band. The subdermal layer was closed with 2-0 Vicryl in a buried interrupted fashion. Skin was closed with 4-0 Monocryl and Dermabond. The wound was then dressed with a Honeycomb dressing. Hip brace was applied. The patient was mobilized from the lateral position back to supine on the operating room table and then awakened from general anesthesia without complication.  POSTOPERATIVE PLAN: The patient will be FFWB x 1 week, progress to 50%WB for weeks 1-2, and then WBAT on operative extremity after 2 weeks. Hip abduction brace to limit active abduction x 2 weeks. ASA 325mg /day x 2 weeks for DVT ppx. PT/OT on POD#3-4. Rehab protocol below.    Cato Mulligan, MD [facility] Phone: [phone] Fax: [phone]  TROCHANTERIC BURSECTOMY/ILIOTIBIAL [place] _____________________________________________________________________________________________ These guidelines should be tailored to individual patients based  on their rehab goals, age, precautions, quality of repair, etc.  Progression should be based on patient progress and approval by the referring physician.  General Guidelines:  . Normalize gait pattern with crutches  . Weight-bearing: 50% WB x 2 weeks, then advance to full (unless otherwise specified) Rehabilitation Goals:  . _Seen 2x/week for 6 weeks   Precautions following Hip Surgery:  . [name] will be determined by procedure  . _Hip flexor tendonitis  . _Trochanteric bursitis  . _Synovitis  . _Manage scarring around incision/portal sites  . _Increase range of motion focusing on flexion  . _No active abduction, no adduction & extension beyond neutral, and no active ER (at least 2 weeks),    Guidelines: Weeks 0-4  . _Bike for 20 minutes/day (can be 2x/day)  . _Aggressive Scar massage to prevent adhesions/recurrence  . _Hip PROM  o Hip flexion to 90 degrees, abduction as tolerated  o No active abduction x 2 weeks  . _Quadruped rocking for hip flexion  . _Gait training PWB with assistive device  . _Hip isometrics  o Extension, adduction, ER at 2 weeks  . _Hamstring isotonics  . _Pelvic tilts  . _NMES to quads with SAQ  . _Modalities    Weeks 4-6  . [name] Scar massage to prevent adhesions/recurrence  . _Continue with previous exercises  . _Progress with passive hip flexion greater than 90 degrees  . _Supine bridges  . _Isotonic adduction  . _Progress core strengthening (avoid hip flexor tendonitis)  . _Progress with hip strengthening  o Start isometric sub max pain free hip flexion (3-4 wks)  o Quadriceps strengthening  . _Aqua therapy in low end of water   Weeks 6-10  . [name] Scar massage to prevent adhesions/recurrence  . _Continue with previous exercises  . _Progress with  ROM  o Passive hip ER/IR  . _Supine log rolling -> Stool rotation -> Standing on BAPS  o Hip Joint mobs with mobilization belt (if needed)  . _Lateral and inferior with rotation  . _Prone posterior-anterior glides with rotation  o Progress core strengthening (avoid hip flexor tendonitis)  . _Continue previous exercises  . _Progress strengthening LE  o Hip isometrics for abduction and progress to isotonics  o Leg press (bilateral LE)  o Isokinetics: knee flexion/extension  . _Progress core strengthening  . _Begin proprioception/balance  o Balance board and single leg stance  . _Bilateral cable column rotations  . _Elliptical   Weeks 10-12  . _Continue with previous exercises  . _Progressive hip ROM  . _Progressive LE and core strengthening o Hip PREs and hip machine o Unilateral Leg press o Unilateral cable column rotations  . _Hip Hiking  o Step downs  . _Hip flexor, glute/piriformis, and It-band Stretching - manual and self     . _Progress balance and proprioception  . _Bilateral -> Unilateral -> foam -> dynadisc  . _Treadmill side stepping from level surface holding on progressing to inclines  . _Side stepping with theraband  . _Hip hiking on stairmaster (week 12)   Weeks 12 +  . _Progressive hip ROM and stretching  . _Progressive LE and core strengthening  . _Endurance activities around the hip  . _Dynamic balance activities  . _Treadmill jogging/running program  . _Sport specific agility drills and plyometrics   3-6 months Re-Evaluate (Criteria for discharge if athlete)  . _Hip Outcome Score  . _Pain free or at least a manageable level of discomfort  . _MMT within 10 percent of uninvolved LE  .  _Biodex test of Quadriceps and Hamstrings peak torque within 15 percent of uninvolved  . _Single leg cross-over triple hop for distance:  o Score of less than 85% are considered abnormal for female and female  . _Step down test   (Protocol courtesy of Dr. Colin Broach)

## 2017-10-29 NOTE — H&P (Signed)
Paper H&P to be scanned into permanent record. H&P reviewed. No significant changes noted.  

## 2017-10-29 NOTE — Anesthesia Procedure Notes (Signed)
Procedure Name: Intubation Performed by: Demetrius Charity, CRNA Pre-anesthesia Checklist: Patient identified, Patient being monitored, Timeout performed, Emergency Drugs available and Suction available Patient Re-evaluated:Patient Re-evaluated prior to induction Oxygen Delivery Method: Circle system utilized Preoxygenation: Pre-oxygenation with 100% oxygen Induction Type: IV induction Ventilation: Mask ventilation without difficulty Laryngoscope Size: Mac and 3 Grade View: Grade I Tube type: Oral Tube size: 7.0 mm Number of attempts: 1 Airway Equipment and Method: Stylet Placement Confirmation: ETT inserted through vocal cords under direct vision,  positive ETCO2 and breath sounds checked- equal and bilateral Secured at: 21 cm Tube secured with: Tape Dental Injury: Teeth and Oropharynx as per pre-operative assessment

## 2017-10-29 NOTE — Transfer of Care (Signed)
Immediate Anesthesia Transfer of Care Note  Patient: Danielle Herrera  Procedure(s) Performed: EXCISION/RELEASE BURSA HIP- IT BAND (Right Hip)  Patient Location: PACU  Anesthesia Type:General  Level of Consciousness: sedated  Airway & Oxygen Therapy: Patient Spontanous Breathing and Patient connected to face mask oxygen  Post-op Assessment: Report given to RN and Post -op Vital signs reviewed and stable  Post vital signs: Reviewed and stable  Last Vitals:  Vitals Value Taken Time  BP 98/57 10/29/2017  9:21 AM  Temp    Pulse 73 10/29/2017  9:21 AM  Resp 18 10/29/2017  9:21 AM  SpO2 99 % 10/29/2017  9:21 AM  Vitals shown include unvalidated device data.  Last Pain:  Vitals:   10/29/17 0611  TempSrc: Tympanic  PainSc: 0-No pain         Complications: No apparent anesthesia complications

## 2017-10-29 NOTE — Anesthesia Postprocedure Evaluation (Signed)
Anesthesia Post Note  Patient: Danielle Herrera  Procedure(s) Performed: EXCISION/RELEASE BURSA HIP- IT BAND (Right Hip)  Patient location during evaluation: PACU Anesthesia Type: General Level of consciousness: awake and alert and oriented Pain management: pain level controlled Vital Signs Assessment: post-procedure vital signs reviewed and stable Respiratory status: spontaneous breathing Cardiovascular status: blood pressure returned to baseline Anesthetic complications: no     Last Vitals:  Vitals:   10/29/17 1105 10/29/17 1208  BP: (!) 100/58 122/83  Pulse: 69 82  Resp: 14 16  Temp: (!) 36.3 C   SpO2: 98% 96%    Last Pain:  Vitals:   10/29/17 1105  TempSrc: Temporal  PainSc: 5                  Lesean Woolverton

## 2017-10-29 NOTE — Anesthesia Preprocedure Evaluation (Signed)
Anesthesia Evaluation  Patient identified by MRN, date of birth, ID band Patient awake    Reviewed: Allergy & Precautions, NPO status , Patient's Chart, lab work & pertinent test results  Airway Mallampati: II  TM Distance: >3 FB     Dental   Pulmonary pneumonia, resolved,    Pulmonary exam normal        Cardiovascular hypertension, Normal cardiovascular exam     Neuro/Psych negative psych ROS   GI/Hepatic   Endo/Other  Hyperthyroidism   Renal/GU      Musculoskeletal  (+) Arthritis , Osteoarthritis,    Abdominal Normal abdominal exam  (+)   Peds  Hematology   Anesthesia Other Findings Past Medical History: No date: Allergy 01/20/2010: Arthritis of knee, left 2019: Bone spur     Comment:  right hip  08/13/2014: Esophageal reflux No date: Fibrocystic breast disease 05/23/2012: Goiter, nontoxic, multinodular No date: H/O jaundice 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 No date: Hepatitis     Comment:  as a child from drinking bad water No date: Hip pain, chronic, right 08/13/2014: HLD (hyperlipidemia) 08/02/2013: Hyperparathyroidism, primary ([place]) No date: Hypertension No date: Hyperthyroidism No date: Multiple thyroid nodules 11/30/2013: Normal pressure hydrocephalus 01/20/2010: Osteopenia No date: Osteoporosis 1960's: Pneumonia No date: Thyroid disease 08/13/2014: Ventricular shunt in place 08/13/2014: Vitamin D deficiency  Reproductive/Obstetrics                             Anesthesia Physical Anesthesia Plan  ASA: III  Anesthesia Plan: General   Post-op Pain Management:    Induction: Intravenous  PONV Risk Score and Plan:   Airway Management Planned: Oral ETT  Additional Equipment:   Intra-op Plan:   Post-operative Plan: Extubation in OR  Informed Consent: I have reviewed the patients History and Physical, chart, labs and discussed the procedure including the risks, benefits and  alternatives for the proposed anesthesia with the patient or authorized representative who has indicated his/her understanding and acceptance.   Dental advisory given  Plan Discussed with: CRNA and Surgeon  Anesthesia Plan Comments:         Anesthesia Quick Evaluation

## 2017-10-29 NOTE — Anesthesia Post-op Follow-up Note (Signed)
Anesthesia QCDR form completed.        

## 2017-11-01 ENCOUNTER — Emergency Department
Admission: EM | Admit: 2017-11-01 | Discharge: 2017-11-01 | Disposition: A | Discharge status: Home / Self Care | Payer: Medicare Other | Attending: Emergency Medicine | Admitting: Emergency Medicine

## 2017-11-01 ENCOUNTER — Encounter: Payer: Self-pay | Admitting: Emergency Medicine

## 2017-11-01 ENCOUNTER — Other Ambulatory Visit: Payer: Self-pay

## 2017-11-01 ENCOUNTER — Emergency Department: Payer: Medicare Other

## 2017-11-01 DIAGNOSIS — E0590 Thyrotoxicosis, unspecified without thyrotoxic crisis or storm: Secondary | ICD-10-CM | POA: Insufficient documentation

## 2017-11-01 DIAGNOSIS — K5900 Constipation, unspecified: Secondary | ICD-10-CM | POA: Diagnosis not present

## 2017-11-01 DIAGNOSIS — Z7982 Long term (current) use of aspirin: Secondary | ICD-10-CM | POA: Diagnosis not present

## 2017-11-01 DIAGNOSIS — I10 Essential (primary) hypertension: Secondary | ICD-10-CM | POA: Diagnosis not present

## 2017-11-01 DIAGNOSIS — Z79899 Other long term (current) drug therapy: Secondary | ICD-10-CM | POA: Diagnosis not present

## 2017-11-01 LAB — CBC WITH DIFFERENTIAL/PLATELET
Basophils Absolute: 0.1 10*3/uL (ref 0–0.1)
Basophils Relative: 1 %
EOS ABS: 0 10*3/uL (ref 0–0.7)
Eosinophils Relative: 0 %
HCT: 37.5 % (ref 35.0–47.0)
Hemoglobin: 12.5 g/dL (ref 12.0–16.0)
Lymphocytes Relative: 6 %
Lymphs Abs: 0.8 10*3/uL — ABNORMAL LOW (ref 1.0–3.6)
MCH: 29.7 pg (ref 26.0–34.0)
MCHC: 33.3 g/dL (ref 32.0–36.0)
MCV: 89.2 fL (ref 80.0–100.0)
MONO ABS: 0.8 10*3/uL (ref 0.2–0.9)
MONOS PCT: 6 %
Neutro Abs: 10.9 10*3/uL — ABNORMAL HIGH (ref 1.4–6.5)
Neutrophils Relative %: 87 %
Platelets: 251 10*3/uL (ref 150–440)
RBC: 4.2 MIL/uL (ref 3.80–5.20)
RDW: 13.2 % (ref 11.5–14.5)
WBC: 12.5 10*3/uL — AB (ref 3.6–11.0)

## 2017-11-01 LAB — COMPREHENSIVE METABOLIC PANEL
ALK PHOS: 55 U/L (ref 38–126)
ALT: 36 U/L (ref 0–44)
AST: 78 U/L — AB (ref 15–41)
Albumin: 3.7 g/dL (ref 3.5–5.0)
Anion gap: 10 (ref 5–15)
BUN: 19 mg/dL (ref 8–23)
CO2: 23 mmol/L (ref 22–32)
Calcium: 9.4 mg/dL (ref 8.9–10.3)
Chloride: 105 mmol/L (ref 98–111)
Creatinine, Ser: 1 mg/dL (ref 0.44–1.00)
GFR calc non Af Amer: 51 mL/min — ABNORMAL LOW (ref >60)
GFR, EST AFRICAN AMERICAN: 60 mL/min — AB (ref >60)
GLUCOSE: 129 mg/dL — AB (ref 70–99)
Potassium: 3.9 mmol/L (ref 3.5–5.1)
Sodium: 138 mmol/L (ref 135–145)
Total Bilirubin: 1.1 mg/dL (ref 0.3–1.2)
Total Protein: 6.7 g/dL (ref 6.5–8.1)

## 2017-11-01 LAB — URINALYSIS, COMPLETE (UACMP) WITH MICROSCOPIC
BILIRUBIN URINE: NEGATIVE
Bacteria, UA: NONE SEEN
Glucose, UA: NEGATIVE mg/dL
KETONES UR: 20 mg/dL — AB
Leukocytes, UA: NEGATIVE
Nitrite: NEGATIVE
Protein, ur: NEGATIVE mg/dL
Specific Gravity, Urine: 1.013 (ref 1.005–1.030)
pH: 5 (ref 5.0–8.0)

## 2017-11-01 LAB — TROPONIN I: Troponin I: <0.03 ng/mL (ref <0.03)

## 2017-11-01 MED ORDER — PEG 3350-KCL-NABCB-NACL-NASULF 240 G PO SOLR: ORAL | 0 refills | Status: DC

## 2017-11-01 MED ORDER — SODIUM CHLORIDE 0.9 % IV BOLUS
1000.0000 mL | Freq: Once | INTRAVENOUS | Status: AC
  Administered 2017-11-01: 1000 mL via INTRAVENOUS

## 2017-11-01 NOTE — ED Notes (Signed)
Pt able to tolerate 3/4 of soap suds enema without distress or difficulty. Pt on toilet attempting to have a BM at this time. Call bell in reach and pt in no slip socks.

## 2017-11-01 NOTE — ED Notes (Signed)
X-ray at bedside

## 2017-11-01 NOTE — ED Notes (Signed)
Pt unable to have a BM after enema.  Pt also reporting nausea at this time. MD made aware.

## 2017-11-01 NOTE — ED Notes (Signed)
Report to Tonya, RN.

## 2017-11-01 NOTE — ED Provider Notes (Signed)
[facility] Emergency Department Provider Note ____________________________________________   I have reviewed the triage vital signs and the triage nursing note.  HISTORY  Chief Complaint Constipation   Historian Patient  HPI Danielle Herrera is a 84 y.o. female presents with complaint of constipation.  No BM since Thursday.  On Friday she had an outpatient procedure surgery with Dr. Posey Pronto of orthopedics of bursectomy in the right hip.  She has taken just 4 tablets of oxycodone since then is not having pain is is not taking any additional pain medications for that.  This morning she had part of a donut and coffee and did vomit x1.  All night she has been having abdominal fullness especially in the lower abdomen and the feeling like she has had a bowel movement.  She states that she is tried multiple remedies for constipation including prune juice and MiraLAX and suppository with no relief.  Pain is moderate.     Past Medical History:  Diagnosis Date  . Allergy   . Arthritis of knee, left 01/20/2010  . Bone spur 2019   right hip   . Esophageal reflux 08/13/2014  . Fibrocystic breast disease   . Goiter, nontoxic, multinodular 05/23/2012  . H/O jaundice   . 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 Hepatitis    as a child from drinking bad water  . Hip pain, chronic, right   . HLD (hyperlipidemia) 08/13/2014  . Hyperparathyroidism, primary ([place]) 08/02/2013  . Hypertension   . Hyperthyroidism   . Multiple thyroid nodules   . Normal pressure hydrocephalus 11/30/2013  . Osteopenia 01/20/2010  . Osteoporosis   . Pneumonia 1960's  . Thyroid disease   . Ventricular shunt in place 08/13/2014  . Vitamin D deficiency 08/13/2014    Patient Active Problem List   Diagnosis Date Noted  . Insomnia 07/12/2017  . Trochanteric bursitis of right hip 01/12/2017  . Constipation 08/11/2016  . Pseudophakia of right eye 12/12/2015  . History of parathyroidectomy 07/10/2015  .  Hyperthyroidism, subclinical 04/22/2015  . Polypharmacy 02/19/2015  . 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 Hyperlipidemia, mixed 08/13/2014  . Vitamin D deficiency 08/13/2014  . Ventricular shunt in place 08/13/2014  . Esophageal reflux 08/13/2014  . Normal pressure hydrocephalus 11/05/2012  . Abnormal gait 08/26/2012  . Goiter, nontoxic, multinodular 05/23/2012  . History of acoustic neuroma 01/20/2010  . Colon polyp 01/20/2010  . Dependent edema 01/20/2010  . Essential (primary) hypertension 01/20/2010  . Arthritis of knee, left 01/20/2010  . Osteopenia 01/20/2010    Past Surgical History:  Procedure Laterality Date  . ABDOMINAL HYSTERECTOMY    . ACOUSTIC NEUROMA RESECTION Right 1985  . APPENDECTOMY    . BRAIN SURGERY  09/08/2012   burr hole with biopsy; cranial tongs caliper/stereotactic frame right (Dr. Ralene Cork)  . BREAST SURGERY Left    lumpectomy  . COLON SURGERY    . EXCISION/RELEASE BURSA HIP Right 10/29/2017   Procedure: EXCISION/RELEASE BURSA HIP- IT BAND;  Surgeon: Leim Fabry, MD;  Location: [facility] ORS;  Service: Orthopedics;  Laterality: Right;  . EYE SURGERY Right    cataract  . FRACTURE SURGERY Right    shoulder  . PARATHYROIDECTOMY  2017   partial for hyperparathyroidism  . SHOULDER ARTHROSCOPY W/ ACROMIAL REPAIR Right 54yrs ago  . TUBAL LIGATION    . VENTRICULOPERITONEAL SHUNT Right 09/08/2012   for NPH    Prior to Admission medications   Medication Sig Start Date End Date Taking? Authorizing Provider  amLODipine (NORVASC)  10 MG tablet Take 1 tablet (10 mg total) by mouth daily. 02/01/17   Plonk, Gwyndolyn Saxon, MD  aspirin EC 325 MG tablet Take 1 tablet (325 mg total) by mouth daily for 14 days. 10/29/17 11/12/17  Leim Fabry, MD  Calcium Citrate-Vitamin D (CALCIUM CITRATE + D3 PO) Take 1 tablet by mouth 2 (two) times daily.    [provider]  carboxymethylcellul-glycerin (OPTIVE) 0.5-0.9 % ophthalmic solution Place 1 drop into both eyes 2 (two)  times daily.  11/18/11   [provider]  Cholecalciferol (VITAMIN D3) 2000 units TABS Take 2,000 Units by mouth daily.    [provider]  diphenhydrAMINE (BENADRYL) 25 MG tablet Take 25 mg by mouth at bedtime.    [provider]  furosemide (LASIX) 20 MG tablet Take 1 tablet (20 mg total) by mouth daily. 02/09/17   Plonk, Gwyndolyn Saxon, MD  ibuprofen (ADVIL,MOTRIN) 800 MG tablet Take 1 tablet (800 mg total) by mouth 3 (three) times daily for 14 days. 10/29/17 11/12/17  Leim Fabry, MD  lisinopril (PRINIVIL,ZESTRIL) 10 MG tablet Take 1 tablet (10 mg total) by mouth daily. 02/01/17 02/01/18  Plonk, Gwyndolyn Saxon, MD  methimazole (TAPAZOLE) 5 MG tablet TAKE 1 TABLET (5 MG TOTAL) BY MOUTH DAILY. 04/07/16   Plonk, Gwyndolyn Saxon, MD  Multiple Vitamin (MULTIVITAMIN WITH MINERALS) TABS tablet Take 1 tablet by mouth daily. Centrum Silver for Women 50+    [provider]  ondansetron (ZOFRAN ODT) 4 MG disintegrating tablet Take 1 tablet (4 mg total) by mouth every 8 (eight) hours as needed for nausea or vomiting. 10/29/17   Leim Fabry, MD  oxyCODONE (ROXICODONE) 5 MG immediate release tablet Take 1-2 tablets (5-10 mg total) by mouth every 4 (four) hours as needed (pain). 10/29/17 10/29/18  Leim Fabry, MD  polyethylene glycol (COLYTE) 240 g solution Drink 562ml every 6 hours until [facility] 11/01/17   Lisa Roca, MD  ranitidine (ZANTAC) 150 MG tablet Take 1 tablet (150 mg total) by mouth as needed for heartburn. Patient taking differently: Take 150 mg by mouth 2 (two) times daily as needed for heartburn.  08/10/16   Plonk, Gwyndolyn Saxon, MD  sennosides-docusate sodium (SENOKOT-S) 8.6-50 MG tablet Take 2 tablets by mouth 2 (two) times daily.  09/18/13   [provider]    Allergies  Allergen Reactions  . Tylenol [Acetaminophen] Other (See Comments)    Elevated LFTs    Family History  Problem Relation Age of Onset  . Hypertension Mother   . Mental illness Mother   . Dementia Mother   . Heart  disease Father   . Hypertension Father   . Diabetes Sister   . Stroke Brother   . Heart disease Brother     Social History Social History   Tobacco Use  . Smoking status: Never Smoker  . Smokeless tobacco: Never Used  Substance Use Topics  . Alcohol use: Yes    Alcohol/week: 3.0 standard drinks    Types: 2 Glasses of wine, 1 Standard drinks or equivalent per week  . Drug use: No    Review of Systems  Constitutional: Negative for fever. Eyes: Negative for visual changes. ENT: Negative for sore throat. Cardiovascular: Negative for chest pain. Respiratory: Negative for shortness of breath. Gastrointestinal: Positive as per HPI Genitourinary: Negative for dysuria. Musculoskeletal: Negative for back pain. Skin: Negative for rash. Neurological: Negative for headache.  ____________________________________________   PHYSICAL EXAM:  VITAL SIGNS: ED Triage Vitals  Enc Vitals Group     BP 11/01/17 1016 112/70  Pulse Rate 11/01/17 1016 (!) 102     Resp 11/01/17 1016 20     Temp 11/01/17 1016 98.5 F (36.9 C)     Temp Source 11/01/17 1016 Oral     SpO2 11/01/17 1016 96 %     Weight 11/01/17 1014 132 lb 4.4 oz (60 kg)     Height 11/01/17 1014 5\' 1"  (1.549 m)     Head Circumference --      Peak Flow --      Pain Score 11/01/17 1014 4     Pain Loc --      Pain Edu? --      Excl. in [place]? --      Constitutional: Alert and oriented.  HEENT      Head: Normocephalic and atraumatic.      Eyes: Conjunctivae are normal. Pupils equal and round.       Ears:         Nose: No congestion/rhinnorhea.      Mouth/Throat: Mucous membranes are moist.      Neck: No stridor. Cardiovascular/Chest: Normal rate, regular rhythm.  No murmurs, rubs, or gallops. Respiratory: Normal respiratory effort without tachypnea nor retractions. Breath sounds are clear and equal bilaterally. No wheezes/rales/rhonchi. Gastrointestinal: Soft. No distention, no guarding, no rebound.  Moderate  tenderness in the lower abdomen without point tenderness. Genitourinary/rectal: Internal nonthrombosed and nontender hemorrhoids.  Nontender rectal exam with hard stool at fingertip. Musculoskeletal: Nontender with normal range of motion in all extremities. No joint effusions.  No lower extremity tenderness.  No edema. Neurologic:  Normal speech and language. No gross or focal neurologic deficits are appreciated. Skin:  Skin is warm, dry and intact. No rash noted. Psychiatric: Mood and affect are normal. Speech and behavior are normal. Patient exhibits appropriate insight and judgment.   ____________________________________________  LABS (pertinent positives/negatives) I, Lisa Roca, MD the attending physician have reviewed the labs noted below.  Labs Reviewed  COMPREHENSIVE METABOLIC PANEL - Abnormal; Notable for the following components:      Result Value   Glucose, Bld 129 (*)    AST 78 (*)    GFR calc non Af Amer 51 (*)    GFR calc Af Amer 60 (*)    All other components within normal limits  CBC WITH DIFFERENTIAL/PLATELET - Abnormal; Notable for the following components:   WBC 12.5 (*)    Neutro Abs 10.9 (*)    Lymphs Abs 0.8 (*)    All other components within normal limits  URINALYSIS, COMPLETE (UACMP) WITH MICROSCOPIC - Abnormal; Notable for the following components:   Color, Urine YELLOW (*)    APPearance CLEAR (*)    Hgb urine dipstick SMALL (*)    Ketones, ur 20 (*)    All other components within normal limits  TROPONIN I    ____________________________________________    EKG I, Lisa Roca, MD, the attending physician have personally viewed and interpreted all ECGs.  90 beats from appear normal sinus rhythm.  Narrow QS with normal axis.  Nonspecific ST and T wave ____________________________________________  RADIOLOGY   Abdomen x-ray with chest: viewed by me, he does report reviewed and noted:IMPRESSION: 1. No acute cardiopulmonary process. 2. Mild  gaseous distension of the colon without evidence of obstruction. 3. Moderate colonic stool burden with barium staining the rectal stool ball. 4. Ventriculoperitoneal shunt catheter with the tip overlying the left lower quadrant. 5. Severe right glenohumeral joint osteoarthritis. 6. Lower lumbar degenerative disc disease. __________________________________________  PROCEDURES  Procedure(s)  performed: None  Procedures  Critical Care performed: None   ____________________________________________  ED COURSE / ASSESSMENT AND PLAN  Pertinent labs & imaging results that were available during my care of the patient were reviewed by me and considered in my medical decision making (see chart for details).    I reviewed her surgical note from August 30 where she had same-day surgery with orthopedics for bursectomy.  No stool at the rectum for manual disimpaction.  Will check EKG and laboratory studies, as well as abdomen x-ray and urinalysis for multiple causes of abdominal discomfort after surgery with emesis.  No additional nausea or emesis here.  Laboratory studies are reassuring.  White blood cell count is just minimally elevated.  No evidence of urine tract infection.  I do not have a high suspicion for intra-abdominal medical or surgical emergency.  I do think her symptoms are related to constipation as a result of the most recent anesthesia and couple days of pain medication.  Was given a soapsuds enema here, no bM.  We discussed at home trying magnesium citrate as an next step.    CONSULTATIONS: None   Patient / Family / Caregiver informed of clinical course, medical decision-making process, and agree with plan.   I discussed return precautions, follow-up instructions, and discharge instructions with patient and/or family.  Discharge Instructions : You are evaluated for not having a bowel movement and your exam and evaluation are overall reassuring in the emergency  department today.  We discussed, next step is to try over-the-counter magnesium citrate, use as directed.  You may also take Dulcolax suppository and or Fleet's enema.  Make sure you are drinking plenty of fluids.  The next step after that for 1 to 2 days would be PEG solution.    ___________________________________________   FINAL CLINICAL IMPRESSION(S) / ED DIAGNOSES   Final diagnoses:  Constipation, unspecified constipation type      ___________________________________________         Note: This dictation was prepared with Dragon dictation. Any transcriptional errors that result from this process are unintentional    Lisa Roca, MD 11/01/17 1427

## 2017-11-01 NOTE — Discharge Instructions (Addendum)
You are evaluated for not having a bowel movement and your exam and evaluation are overall reassuring in the emergency department today.  We discussed, next step is to try over-the-counter magnesium citrate, use as directed.  You may also take Dulcolax suppository and or Fleet's enema.  Make sure you are drinking plenty of fluids.  The next step after that for 1 to 2 days would be PEG solution.

## 2017-11-01 NOTE — ED Notes (Signed)
ED Provider at bedside. 

## 2017-11-01 NOTE — ED Triage Notes (Signed)
Here for possible ileus.  Recent surgery on 30th. No bowel movement or gas.  Has had vomiting.  Ambulatory.

## 2017-11-02 DIAGNOSIS — M25551 Pain in right hip: Secondary | ICD-10-CM | POA: Diagnosis not present

## 2017-11-02 DIAGNOSIS — M25651 Stiffness of right hip, not elsewhere classified: Secondary | ICD-10-CM | POA: Diagnosis not present

## 2017-11-02 DIAGNOSIS — G8929 Other chronic pain: Secondary | ICD-10-CM | POA: Diagnosis not present

## 2017-11-02 DIAGNOSIS — M6281 Muscle weakness (generalized): Secondary | ICD-10-CM | POA: Diagnosis not present

## 2017-11-11 DIAGNOSIS — G8929 Other chronic pain: Secondary | ICD-10-CM | POA: Diagnosis not present

## 2017-11-11 DIAGNOSIS — M25651 Stiffness of right hip, not elsewhere classified: Secondary | ICD-10-CM | POA: Diagnosis not present

## 2017-11-11 DIAGNOSIS — M6281 Muscle weakness (generalized): Secondary | ICD-10-CM | POA: Diagnosis not present

## 2017-11-11 DIAGNOSIS — M25551 Pain in right hip: Secondary | ICD-10-CM | POA: Diagnosis not present

## 2017-12-10 DIAGNOSIS — M25551 Pain in right hip: Secondary | ICD-10-CM | POA: Diagnosis not present

## 2017-12-10 DIAGNOSIS — M7061 Trochanteric bursitis, right hip: Secondary | ICD-10-CM | POA: Diagnosis not present

## 2017-12-23 DIAGNOSIS — M9902 Segmental and somatic dysfunction of thoracic region: Secondary | ICD-10-CM | POA: Diagnosis not present

## 2017-12-23 DIAGNOSIS — M9903 Segmental and somatic dysfunction of lumbar region: Secondary | ICD-10-CM | POA: Diagnosis not present

## 2017-12-23 DIAGNOSIS — M5136 Other intervertebral disc degeneration, lumbar region: Secondary | ICD-10-CM | POA: Diagnosis not present

## 2017-12-23 DIAGNOSIS — M461 Sacroiliitis, not elsewhere classified: Secondary | ICD-10-CM | POA: Diagnosis not present

## 2017-12-23 DIAGNOSIS — M546 Pain in thoracic spine: Secondary | ICD-10-CM | POA: Diagnosis not present

## 2017-12-23 DIAGNOSIS — M9905 Segmental and somatic dysfunction of pelvic region: Secondary | ICD-10-CM | POA: Diagnosis not present

## 2018-01-12 ENCOUNTER — Ambulatory Visit (INDEPENDENT_AMBULATORY_CARE_PROVIDER_SITE_OTHER): Payer: Medicare Other | Admitting: Internal Medicine

## 2018-01-12 ENCOUNTER — Other Ambulatory Visit: Payer: Self-pay | Admitting: Family Medicine

## 2018-01-12 ENCOUNTER — Encounter: Payer: Self-pay | Admitting: Internal Medicine

## 2018-01-12 ENCOUNTER — Other Ambulatory Visit: Payer: Self-pay | Admitting: Internal Medicine

## 2018-01-12 VITALS — BP 112/70 | HR 82 | Ht 61.0 in | Wt 129.8 lb

## 2018-01-12 DIAGNOSIS — G912 (Idiopathic) normal pressure hydrocephalus: Secondary | ICD-10-CM

## 2018-01-12 DIAGNOSIS — E0590 Thyrotoxicosis, unspecified without thyrotoxic crisis or storm: Secondary | ICD-10-CM

## 2018-01-12 DIAGNOSIS — N6320 Unspecified lump in the left breast, unspecified quadrant: Secondary | ICD-10-CM

## 2018-01-12 DIAGNOSIS — I10 Essential (primary) hypertension: Secondary | ICD-10-CM

## 2018-01-12 DIAGNOSIS — F5101 Primary insomnia: Secondary | ICD-10-CM | POA: Diagnosis not present

## 2018-01-12 DIAGNOSIS — R251 Tremor, unspecified: Secondary | ICD-10-CM

## 2018-01-12 LAB — POCT URINALYSIS DIPSTICK
Bilirubin, UA: NEGATIVE
Blood, UA: NEGATIVE
Glucose, UA: NEGATIVE
KETONES UA: NEGATIVE
Leukocytes, UA: NEGATIVE
NITRITE UA: NEGATIVE
PH UA: 6 (ref 5.0–8.0)
PROTEIN UA: NEGATIVE
Spec Grav, UA: 1.015 (ref 1.010–1.025)
UROBILINOGEN UA: 0.2 U/dL

## 2018-01-12 NOTE — Progress Notes (Signed)
Date:  01/12/2018   Name:  Danielle Herrera   DOB:  08-19-1933   MRN:  [id number]   Chief Complaint: Hypertension (Needs to schedule MAW when leaves today. ); Hypothyroidism; Breast Screening (Breast Exam.); and Insomnia  Hypertension  This is a chronic problem. The problem is unchanged. The problem is controlled. Pertinent negatives include no chest pain, headaches, palpitations or shortness of breath. Past treatments include calcium channel blockers, ACE inhibitors and diuretics. The current treatment provides significant improvement. Identifiable causes of hypertension include a thyroid problem.  Insomnia  Primary symptoms: sleep disturbance.  The problem occurs nightly. Past treatments include medication (benadryl).  Thyroid Problem  Presents for follow-up visit. Symptoms include tremors. Patient reports no anxiety, constipation, diarrhea, fatigue or palpitations. (Followed by endo at [facility] - following nodules, s/p parathyroidectomy)    Review of Systems  Constitutional: Negative for chills, fatigue and fever.  HENT: Negative for congestion, hearing loss, tinnitus, trouble swallowing and voice change.   Eyes: Negative for visual disturbance.  Respiratory: Negative for cough, chest tightness, shortness of breath and wheezing.   Cardiovascular: Negative for chest pain, palpitations and leg swelling.  Gastrointestinal: Negative for abdominal pain, constipation, diarrhea and vomiting.  Endocrine: Negative for polydipsia and polyuria.  Genitourinary: Negative for dysuria, frequency, genital sores, vaginal bleeding and vaginal discharge.  Musculoskeletal: Positive for arthralgias. Negative for gait problem and joint swelling.  Skin: Negative for color change and rash.  Neurological: Positive for tremors. Negative for dizziness, light-headedness and headaches.  Hematological: Negative for adenopathy. Does not bruise/bleed easily.  Psychiatric/Behavioral: Positive for sleep  disturbance. Negative for dysphoric mood. The patient has insomnia. The patient is not nervous/anxious.     Patient Active Problem List   Diagnosis Date Noted  . Insomnia 07/12/2017  . Trochanteric bursitis of right hip 01/12/2017  . Constipation 08/11/2016  . Pseudophakia of right eye 12/12/2015  . History of parathyroidectomy 07/10/2015  . Hyperthyroidism, subclinical 04/22/2015  . Polypharmacy 02/19/2015  . 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 Hyperlipidemia, mixed 08/13/2014  . Vitamin D deficiency 08/13/2014  . Ventricular shunt in place 08/13/2014  . Esophageal reflux 08/13/2014  . Normal pressure hydrocephalus ([place]) 11/05/2012  . Abnormal gait 08/26/2012  . Goiter, nontoxic, multinodular 05/23/2012  . History of acoustic neuroma 01/20/2010  . Colon polyp 01/20/2010  . Dependent edema 01/20/2010  . Essential (primary) hypertension 01/20/2010  . Arthritis of knee, left 01/20/2010  . Osteopenia 01/20/2010    Allergies  Allergen Reactions  . Tylenol [Acetaminophen] Other (See Comments)    Elevated LFTs    Past Surgical History:  Procedure Laterality Date  . ABDOMINAL HYSTERECTOMY    . ACOUSTIC NEUROMA RESECTION Right 1985  . APPENDECTOMY    . BRAIN SURGERY  09/08/2012   burr hole with biopsy; cranial tongs caliper/stereotactic frame right (Dr. Ralene Cork)  . BREAST SURGERY Left    lumpectomy  . COLON SURGERY    . EXCISION/RELEASE BURSA HIP Right 10/29/2017   Procedure: EXCISION/RELEASE BURSA HIP- IT BAND;  Surgeon: Leim Fabry, MD;  Location: [facility] ORS;  Service: Orthopedics;  Laterality: Right;  . EYE SURGERY Right    cataract  . FRACTURE SURGERY Right    shoulder  . PARATHYROIDECTOMY  2017   partial for hyperparathyroidism  . SHOULDER ARTHROSCOPY W/ ACROMIAL REPAIR Right 42yrs ago  . TUBAL LIGATION    . VENTRICULOPERITONEAL SHUNT Right 09/08/2012   for NPH    Social History   Tobacco Use  . Smoking status: Never  Smoker  . Smokeless tobacco: Never  Used  Substance Use Topics  . Alcohol use: Yes    Alcohol/week: 3.0 standard drinks    Types: 2 Glasses of wine, 1 Standard drinks or equivalent per week  . Drug use: No     Medication list has been reviewed and updated.  Current Meds  Medication Sig  . amLODipine (NORVASC) 10 MG tablet Take 1 tablet (10 mg total) by mouth daily.  . Calcium Citrate-Vitamin D (CALCIUM CITRATE + D3 PO) Take 1 tablet by mouth 2 (two) times daily.  . carboxymethylcellul-glycerin (OPTIVE) 0.5-0.9 % ophthalmic solution Place 1 drop into both eyes 2 (two) times daily.   . Cholecalciferol (VITAMIN D3) 2000 units TABS Take 2,000 Units by mouth daily.  . diphenhydrAMINE (BENADRYL) 25 MG tablet Take 25 mg by mouth at bedtime.  . furosemide (LASIX) 20 MG tablet Take 1 tablet (20 mg total) by mouth daily.  Marland Kitchen lisinopril (PRINIVIL,ZESTRIL) 10 MG tablet Take 1 tablet (10 mg total) by mouth daily.  . methimazole (TAPAZOLE) 5 MG tablet TAKE 1 TABLET (5 MG TOTAL) BY MOUTH DAILY.  . Multiple Vitamin (MULTIVITAMIN WITH MINERALS) TABS tablet Take 1 tablet by mouth daily. Centrum Silver for Women 50+  . ondansetron (ZOFRAN ODT) 4 MG disintegrating tablet Take 1 tablet (4 mg total) by mouth every 8 (eight) hours as needed for nausea or vomiting.  Marland Kitchen oxyCODONE (ROXICODONE) 5 MG immediate release tablet Take 1-2 tablets (5-10 mg total) by mouth every 4 (four) hours as needed (pain).  . polyethylene glycol (COLYTE) 240 g solution Drink 564ml every 6 hours until BM  . ranitidine (ZANTAC) 150 MG tablet Take 1 tablet (150 mg total) by mouth as needed for heartburn. (Patient taking differently: Take 150 mg by mouth 2 (two) times daily as needed for heartburn. )  . sennosides-docusate sodium (SENOKOT-S) 8.6-50 MG tablet Take 2 tablets by mouth 2 (two) times daily.     PHQ 2/9 Scores 07/12/2017 07/20/2016 07/20/2016 07/10/2015  PHQ - 2 Score 0 0 0 0  PHQ- 9 Score - 1 - -    Physical Exam  Constitutional: She is oriented to person,  place, and time. She appears well-developed and well-nourished. No distress.  HENT:  Head: Normocephalic and atraumatic.  Right Ear: Tympanic membrane and ear canal normal.  Left Ear: Tympanic membrane and ear canal normal.  Nose: Right sinus exhibits no maxillary sinus tenderness. Left sinus exhibits no maxillary sinus tenderness.  Mouth/Throat: Uvula is midline and oropharynx is clear and moist.  Eyes: Conjunctivae and EOM are normal. Right eye exhibits no discharge. Left eye exhibits no discharge. No scleral icterus.  Neck: Normal range of motion. Carotid bruit is not present. No erythema present. No thyromegaly present.  Cardiovascular: Normal rate, regular rhythm, normal heart sounds and normal pulses.  Pulmonary/Chest: Effort normal and breath sounds normal. No respiratory distress. She has no wheezes. Right breast exhibits no mass, no nipple discharge, no skin change and no tenderness. Left breast exhibits mass. Left breast exhibits no nipple discharge, no skin change and no tenderness.    Abdominal: Soft. Bowel sounds are normal. There is no hepatosplenomegaly. There is no tenderness. There is no CVA tenderness.  Musculoskeletal: Normal range of motion.  Lymphadenopathy:    She has no cervical adenopathy.    She has no axillary adenopathy.  Neurological: She is alert and oriented to person, place, and time. She has normal reflexes. She displays tremor. No cranial nerve deficit or sensory deficit. Coordination  and gait normal.  FTN testing normal Gait normal Resting tremor noted of outstretched hands Resting tremor noted of head Voice normal strength and phonation  Skin: Skin is warm, dry and intact. No rash noted.  Psychiatric: She has a normal mood and affect. Her speech is normal and behavior is normal. Thought content normal.  Nursing note and vitals reviewed.   BP 112/70 (BP Location: Right Arm, Patient Position: Sitting, Cuff Size: Normal)   Pulse 82   Ht 5\' 1"  (1.549 m)    Wt 129 lb 12.8 oz (58.9 kg)   SpO2 96%   BMI 24.53 kg/m   Assessment and Plan: 1. Essential (primary) hypertension Controlled, continue current therapy - CBC with Differential/Platelet - Comprehensive metabolic panel - Lipid panel - POCT urinalysis dipstick  2. Normal pressure hydrocephalus (HCC) S/p stent, now with new tremor - Ambulatory referral to Neurology  3. Hyperthyroidism, subclinical Followed by endocrinology  4. Primary insomnia  5. Tremor - Ambulatory referral to Neurology  6. Breast mass, left Referred for Dx mammogram and USs   Partially dictated using Dragon software. Any errors are unintentional.  Halina Maidens, MD [facility]  01/12/2018

## 2018-01-13 LAB — CBC WITH DIFFERENTIAL/PLATELET
BASOS: 2 %
Basophils Absolute: 0.1 10*3/uL (ref 0.0–0.2)
EOS (ABSOLUTE): 0.2 10*3/uL (ref 0.0–0.4)
EOS: 4 %
HEMATOCRIT: 41 % (ref 34.0–46.6)
HEMOGLOBIN: 14 g/dL (ref 11.1–15.9)
IMMATURE GRANS (ABS): 0 10*3/uL (ref 0.0–0.1)
Immature Granulocytes: 0 %
LYMPHS: 36 %
Lymphocytes Absolute: 2.1 10*3/uL (ref 0.7–3.1)
MCH: 29.1 pg (ref 26.6–33.0)
MCHC: 34.1 g/dL (ref 31.5–35.7)
MCV: 85 fL (ref 79–97)
MONOCYTES: 11 %
Monocytes Absolute: 0.6 10*3/uL (ref 0.1–0.9)
NEUTROS ABS: 2.8 10*3/uL (ref 1.4–7.0)
Neutrophils: 47 %
Platelets: 339 10*3/uL (ref 150–450)
RBC: 4.81 x10E6/uL (ref 3.77–5.28)
RDW: 12.2 % — ABNORMAL LOW (ref 12.3–15.4)
WBC: 5.8 10*3/uL (ref 3.4–10.8)

## 2018-01-13 LAB — COMPREHENSIVE METABOLIC PANEL
A/G RATIO: 2 (ref 1.2–2.2)
ALBUMIN: 4.9 g/dL — AB (ref 3.5–4.7)
ALT: 15 IU/L (ref 0–32)
AST: 23 IU/L (ref 0–40)
Alkaline Phosphatase: 67 IU/L (ref 39–117)
BILIRUBIN TOTAL: 0.3 mg/dL (ref 0.0–1.2)
BUN / CREAT RATIO: 16 (ref 12–28)
BUN: 17 mg/dL (ref 8–27)
CALCIUM: 10.6 mg/dL — AB (ref 8.7–10.3)
CHLORIDE: 102 mmol/L (ref 96–106)
CO2: 22 mmol/L (ref 20–29)
Creatinine, Ser: 1.09 mg/dL — ABNORMAL HIGH (ref 0.57–1.00)
GFR, EST AFRICAN AMERICAN: 55 mL/min/{1.73_m2} — AB (ref >59)
GFR, EST NON AFRICAN AMERICAN: 47 mL/min/{1.73_m2} — AB (ref >59)
GLOBULIN, TOTAL: 2.4 g/dL (ref 1.5–4.5)
Glucose: 100 mg/dL — ABNORMAL HIGH (ref 65–99)
POTASSIUM: 4 mmol/L (ref 3.5–5.2)
Sodium: 141 mmol/L (ref 134–144)
TOTAL PROTEIN: 7.3 g/dL (ref 6.0–8.5)

## 2018-01-13 LAB — LIPID PANEL
CHOL/HDL RATIO: 3.1 ratio (ref 0.0–4.4)
Cholesterol, Total: 311 mg/dL — ABNORMAL HIGH (ref 100–199)
HDL: 100 mg/dL (ref >39)
LDL Calculated: 181 mg/dL — ABNORMAL HIGH (ref 0–99)
Triglycerides: 149 mg/dL (ref 0–149)
VLDL CHOLESTEROL CAL: 30 mg/dL (ref 5–40)

## 2018-01-20 DIAGNOSIS — M5136 Other intervertebral disc degeneration, lumbar region: Secondary | ICD-10-CM | POA: Diagnosis not present

## 2018-01-20 DIAGNOSIS — M546 Pain in thoracic spine: Secondary | ICD-10-CM | POA: Diagnosis not present

## 2018-01-20 DIAGNOSIS — M9905 Segmental and somatic dysfunction of pelvic region: Secondary | ICD-10-CM | POA: Diagnosis not present

## 2018-01-20 DIAGNOSIS — M461 Sacroiliitis, not elsewhere classified: Secondary | ICD-10-CM | POA: Diagnosis not present

## 2018-01-20 DIAGNOSIS — M9903 Segmental and somatic dysfunction of lumbar region: Secondary | ICD-10-CM | POA: Diagnosis not present

## 2018-01-20 DIAGNOSIS — M9902 Segmental and somatic dysfunction of thoracic region: Secondary | ICD-10-CM | POA: Diagnosis not present

## 2018-01-21 ENCOUNTER — Ambulatory Visit
Admission: RE | Admit: 2018-01-21 | Discharge: 2018-01-21 | Disposition: A | Discharge status: Home / Self Care | Payer: Medicare Other | Source: Ambulatory Visit | Attending: Internal Medicine | Admitting: Internal Medicine

## 2018-01-21 DIAGNOSIS — N6320 Unspecified lump in the left breast, unspecified quadrant: Secondary | ICD-10-CM

## 2018-01-21 DIAGNOSIS — R922 Inconclusive mammogram: Secondary | ICD-10-CM | POA: Diagnosis not present

## 2018-01-21 DIAGNOSIS — N6489 Other specified disorders of breast: Secondary | ICD-10-CM | POA: Diagnosis not present

## 2018-02-03 DIAGNOSIS — E0590 Thyrotoxicosis, unspecified without thyrotoxic crisis or storm: Secondary | ICD-10-CM | POA: Diagnosis not present

## 2018-02-03 DIAGNOSIS — E210 Primary hyperparathyroidism: Secondary | ICD-10-CM | POA: Diagnosis not present

## 2018-02-03 DIAGNOSIS — E0520 Thyrotoxicosis with toxic multinodular goiter without thyrotoxic crisis or storm: Secondary | ICD-10-CM | POA: Diagnosis not present

## 2018-02-03 DIAGNOSIS — E042 Nontoxic multinodular goiter: Secondary | ICD-10-CM | POA: Diagnosis not present

## 2018-03-01 DIAGNOSIS — M9905 Segmental and somatic dysfunction of pelvic region: Secondary | ICD-10-CM | POA: Diagnosis not present

## 2018-03-01 DIAGNOSIS — M9902 Segmental and somatic dysfunction of thoracic region: Secondary | ICD-10-CM | POA: Diagnosis not present

## 2018-03-01 DIAGNOSIS — M9903 Segmental and somatic dysfunction of lumbar region: Secondary | ICD-10-CM | POA: Diagnosis not present

## 2018-03-01 DIAGNOSIS — M5136 Other intervertebral disc degeneration, lumbar region: Secondary | ICD-10-CM | POA: Diagnosis not present

## 2018-03-01 DIAGNOSIS — M546 Pain in thoracic spine: Secondary | ICD-10-CM | POA: Diagnosis not present

## 2018-03-01 DIAGNOSIS — M461 Sacroiliitis, not elsewhere classified: Secondary | ICD-10-CM | POA: Diagnosis not present

## 2018-03-10 DIAGNOSIS — G250 Essential tremor: Secondary | ICD-10-CM | POA: Insufficient documentation

## 2018-03-10 DIAGNOSIS — R251 Tremor, unspecified: Secondary | ICD-10-CM | POA: Diagnosis not present

## 2018-03-18 ENCOUNTER — Other Ambulatory Visit: Payer: Self-pay | Admitting: Internal Medicine

## 2018-03-18 ENCOUNTER — Other Ambulatory Visit: Payer: Self-pay | Admitting: Family Medicine

## 2018-03-18 ENCOUNTER — Other Ambulatory Visit: Payer: Self-pay

## 2018-03-18 DIAGNOSIS — H2512 Age-related nuclear cataract, left eye: Secondary | ICD-10-CM | POA: Diagnosis not present

## 2018-03-18 DIAGNOSIS — H04123 Dry eye syndrome of bilateral lacrimal glands: Secondary | ICD-10-CM | POA: Diagnosis not present

## 2018-03-18 DIAGNOSIS — H02886 Meibomian gland dysfunction of left eye, unspecified eyelid: Secondary | ICD-10-CM | POA: Diagnosis not present

## 2018-03-18 DIAGNOSIS — Z961 Presence of intraocular lens: Secondary | ICD-10-CM | POA: Diagnosis not present

## 2018-03-18 DIAGNOSIS — H11002 Unspecified pterygium of left eye: Secondary | ICD-10-CM | POA: Diagnosis not present

## 2018-03-18 DIAGNOSIS — H35363 Drusen (degenerative) of macula, bilateral: Secondary | ICD-10-CM | POA: Diagnosis not present

## 2018-03-18 DIAGNOSIS — H02883 Meibomian gland dysfunction of right eye, unspecified eyelid: Secondary | ICD-10-CM | POA: Diagnosis not present

## 2018-03-18 MED ORDER — FUROSEMIDE 20 MG PO TABS: 20.0000 mg | ORAL_TABLET | Freq: Every day | ORAL | 3 refills | Status: DC

## 2018-03-21 ENCOUNTER — Other Ambulatory Visit: Payer: Self-pay | Admitting: Internal Medicine

## 2018-03-29 DIAGNOSIS — M955 Acquired deformity of pelvis: Secondary | ICD-10-CM | POA: Diagnosis not present

## 2018-03-29 DIAGNOSIS — M5441 Lumbago with sciatica, right side: Secondary | ICD-10-CM | POA: Diagnosis not present

## 2018-03-29 DIAGNOSIS — M9905 Segmental and somatic dysfunction of pelvic region: Secondary | ICD-10-CM | POA: Diagnosis not present

## 2018-03-29 DIAGNOSIS — M9901 Segmental and somatic dysfunction of cervical region: Secondary | ICD-10-CM | POA: Diagnosis not present

## 2018-03-29 DIAGNOSIS — M9903 Segmental and somatic dysfunction of lumbar region: Secondary | ICD-10-CM | POA: Diagnosis not present

## 2018-03-29 DIAGNOSIS — M531 Cervicobrachial syndrome: Secondary | ICD-10-CM | POA: Diagnosis not present

## 2018-05-03 DIAGNOSIS — M9901 Segmental and somatic dysfunction of cervical region: Secondary | ICD-10-CM | POA: Diagnosis not present

## 2018-05-03 DIAGNOSIS — M955 Acquired deformity of pelvis: Secondary | ICD-10-CM | POA: Diagnosis not present

## 2018-05-03 DIAGNOSIS — M5441 Lumbago with sciatica, right side: Secondary | ICD-10-CM | POA: Diagnosis not present

## 2018-05-03 DIAGNOSIS — M9903 Segmental and somatic dysfunction of lumbar region: Secondary | ICD-10-CM | POA: Diagnosis not present

## 2018-05-03 DIAGNOSIS — M531 Cervicobrachial syndrome: Secondary | ICD-10-CM | POA: Diagnosis not present

## 2018-05-03 DIAGNOSIS — M9905 Segmental and somatic dysfunction of pelvic region: Secondary | ICD-10-CM | POA: Diagnosis not present

## 2018-05-04 ENCOUNTER — Ambulatory Visit (INDEPENDENT_AMBULATORY_CARE_PROVIDER_SITE_OTHER): Payer: Medicare Other

## 2018-05-04 VITALS — BP 136/78 | HR 93 | Temp 97.5°F | Resp 17 | Ht 61.0 in | Wt 133.0 lb

## 2018-05-04 DIAGNOSIS — Z0000 Encounter for general adult medical examination without abnormal findings: Secondary | ICD-10-CM | POA: Diagnosis not present

## 2018-05-04 NOTE — Progress Notes (Signed)
Subjective:   Danielle Herrera is a 85 y.o. female who presents for Medicare Annual (Subsequent) preventive examination.  Review of Systems:   Cardiac Risk Factors include: advanced age (>32men, >43 women);hypertension     Objective:     Vitals: BP 136/78 (BP Location: Left Arm, Patient Position: Sitting, Cuff Size: Normal)   Pulse 93   Temp (!) 97.5 F (36.4 C) (Oral)   Resp 17   Ht 5\' 1"  (1.549 m)   Wt 133 lb (60.3 kg)   SpO2 98%   BMI 25.13 kg/m   Body mass index is 25.13 kg/m.  Advanced Directives 05/04/2018 11/01/2017 11/01/2017 10/25/2017 07/20/2016 02/19/2015  Does Patient Have a Medical Advance Directive? Yes Yes Yes Yes Yes Yes  Type of Advance Directive Living will;Healthcare Power of [place];Living will [place];Living will [place];Living will  Does patient want to make changes to medical advance directive? - - - No - Patient declined - -  Copy of [place] in Chart? Yes - validated most recent copy scanned in chart (See row information) - - Yes No - copy requested No - copy requested    Tobacco Social History   Tobacco Use  Smoking Status Never Smoker  Smokeless Tobacco Never Used     Counseling given: Not Answered   Clinical Intake:  Pre-visit preparation completed: Yes  Pain : 0-10 Pain Score: 3  Pain Type: Acute pain Pain Location: Ankle Pain Orientation: Right Pain Descriptors / Indicators: Aching Pain Onset: 1 to 4 weeks ago Pain Frequency: Constant     BMI - recorded: 25.13 Nutritional Status: BMI 25 -29 Overweight Nutritional Risks: None Diabetes: No  How often do you need to have someone help you when you read instructions, pamphlets, or other written materials from your doctor or pharmacy?: 1 - Never  Interpreter Needed?: No  Information entered by :: Clemetine Marker LPN  Past Medical History:  Diagnosis Date  . Allergy   . Arthritis of  knee, left 01/20/2010  . Bone spur 2019   right hip   . Esophageal reflux 08/13/2014  . Fibrocystic breast disease   . Goiter, nontoxic, multinodular 05/23/2012  . H/O jaundice   . 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 Hepatitis    as a child from drinking bad water  . Hip pain, chronic, right   . HLD (hyperlipidemia) 08/13/2014  . Hyperparathyroidism, primary ([place]) 08/02/2013  . Hypertension   . Hyperthyroidism   . Multiple thyroid nodules   . Normal pressure hydrocephalus ([place]) 11/30/2013  . Osteopenia 01/20/2010  . Osteoporosis   . Pneumonia 1960's  . Thyroid disease   . Ventricular shunt in place 08/13/2014  . Vitamin D deficiency 08/13/2014   Past Surgical History:  Procedure Laterality Date  . ABDOMINAL HYSTERECTOMY    . ACOUSTIC NEUROMA RESECTION Right 1985  . APPENDECTOMY    . BRAIN SURGERY  09/08/2012   burr hole with biopsy; cranial tongs caliper/stereotactic frame right (Dr. Ralene Cork)  . BREAST EXCISIONAL BIOPSY Left    benign  . BREAST EXCISIONAL BIOPSY Left    benign  . BREAST SURGERY Left    lumpectomy  . COLON SURGERY    . EXCISION/RELEASE BURSA HIP Right 10/29/2017   Procedure: EXCISION/RELEASE BURSA HIP- IT BAND;  Surgeon: Leim Fabry, MD;  Location: [facility] ORS;  Service: Orthopedics;  Laterality: Right;  . EYE SURGERY Right    cataract  . FRACTURE  SURGERY Right    shoulder  . PARATHYROIDECTOMY  2017   partial for hyperparathyroidism  . SHOULDER ARTHROSCOPY W/ ACROMIAL REPAIR Right 51yrs ago  . TUBAL LIGATION    . VENTRICULOPERITONEAL SHUNT Right 09/08/2012   for NPH   Family History  Problem Relation Age of Onset  . Hypertension Mother   . Mental illness Mother   . Dementia Mother   . Heart disease Father   . Hypertension Father   . Diabetes Sister   . Stroke Brother   . Heart disease Brother    Social History   Socioeconomic History  . Marital status: Widowed    Spouse name: Not on file  . Number of children: 0  . Years of  education: Not on file  . Highest education level: 12th grade  Occupational History  . Occupation: retired  Scientific laboratory technician  . Financial resource strain: Not hard at all  . Food insecurity:    Worry: Never true    Inability: Never true  . Transportation needs:    Medical: No    Non-medical: No  Tobacco Use  . Smoking status: Never Smoker  . Smokeless tobacco: Never Used  Substance and Sexual Activity  . Alcohol use: Yes    Alcohol/week: 3.0 standard drinks    Types: 2 Glasses of wine, 1 Standard drinks or equivalent per week  . Drug use: No  . Sexual activity: Not Currently  Lifestyle  . Physical activity:    Days per week: 0 days    Minutes per session: 0 min  . Stress: Not at all  Relationships  . Social connections:    Talks on phone: More than three times a week    Gets together: Twice a week    Attends religious service: More than 4 times per year    Active member of club or organization: No    Attends meetings of clubs or organizations: Never    Relationship status: Widowed  Other Topics Concern  . Not on file  Social History Narrative  . Not on file    Outpatient Encounter Medications as of 05/04/2018  Medication Sig  . amLODipine (NORVASC) 10 MG tablet TAKE 1 TABLET BY MOUTH EVERY DAY  . carboxymethylcellul-glycerin (OPTIVE) 0.5-0.9 % ophthalmic solution Place 1 drop into both eyes 2 (two) times daily.   . Cholecalciferol (VITAMIN D3) 2000 units TABS Take 1,000 Units by mouth daily.   . diphenhydrAMINE (BENADRYL) 25 MG tablet Take 25 mg by mouth at bedtime.  . furosemide (LASIX) 20 MG tablet Take 1 tablet (20 mg total) by mouth daily.  Marland Kitchen lisinopril (PRINIVIL,ZESTRIL) 10 MG tablet TAKE 1 TABLET BY MOUTH EVERY DAY  . methimazole (TAPAZOLE) 5 MG tablet TAKE 1 TABLET (5 MG TOTAL) BY MOUTH DAILY.  . Multiple Vitamin (MULTIVITAMIN WITH MINERALS) TABS tablet Take 1 tablet by mouth daily. Centrum Silver for Women 50+  . ranitidine (ZANTAC) 150 MG tablet Take 1 tablet (150  mg total) by mouth as needed for heartburn. (Patient taking differently: Take 150 mg by mouth 2 (two) times daily as needed for heartburn. )  . sennosides-docusate sodium (SENOKOT-S) 8.6-50 MG tablet Take 2 tablets by mouth 2 (two) times daily.   . [DISCONTINUED] ondansetron (ZOFRAN ODT) 4 MG disintegrating tablet Take 1 tablet (4 mg total) by mouth every 8 (eight) hours as needed for nausea or vomiting.  . [DISCONTINUED] Calcium Citrate-Vitamin D (CALCIUM CITRATE + D3 PO) Take 1 tablet by mouth 2 (two) times daily.  . [  DISCONTINUED] oxyCODONE (ROXICODONE) 5 MG immediate release tablet Take 1-2 tablets (5-10 mg total) by mouth every 4 (four) hours as needed (pain).  . [DISCONTINUED] polyethylene glycol (COLYTE) 240 g solution Drink 572ml every 6 hours until BM   No facility-administered encounter medications on file as of 05/04/2018.     Activities of Daily Living In your present state of health, do you have any difficulty performing the following activities: 05/04/2018 10/29/2017  Hearing? Y Y  Comment wears hearing aid -  Vision? N N  Comment wears glasses -  Difficulty concentrating or making decisions? N N  Walking or climbing stairs? N N  Comment - -  Dressing or bathing? N N  Doing errands, shopping? N -  Preparing Food and eating ? N -  Using the Toilet? N -  In the past six months, have you accidently leaked urine? N -  Do you have problems with loss of bowel control? N -  Managing your Medications? N -  Managing your Finances? N -  Housekeeping or managing your Housekeeping? N -  Some recent data might be hidden    Patient Care Team: Glean Hess, MD as PCP - General (Internal Medicine) Lanier Clam, MD (Endocrinology) Ubaldo Glassing Javier Docker, MD as Consulting Physician (Cardiology) Diamond Nickel, DO as Consulting Physician (Sports Medicine) Dionne Bucy, MD as Referring Physician (Ophthalmology)    Assessment:   This is a routine wellness examination for  Skylin.  Exercise Activities and Dietary recommendations Current Exercise Habits: The patient does not participate in regular exercise at present, Exercise limited by: orthopedic condition(s)  Goals    . Increase water intake     Recommend drinking 3-4 glasses of water a day.       Fall Risk Fall Risk  05/04/2018 07/12/2017 07/20/2016 07/10/2015 02/19/2015  Falls in the past year? 1 No No No Yes  Number falls in past yr: 1 - - - 1  Injury with Fall? 1 - - - No  Comment sprained ankle - - - -  Risk for fall due to : Impaired balance/gait - - - -  Follow up Falls prevention discussed - - - -   FALL RISK PREVENTION PERTAINING TO THE HOME:  Any stairs in or around the home? No  If so, do they handrails? No   Home free of loose throw rugs in walkways, pet beds, electrical cords, etc? Yes  Adequate lighting in your home to reduce risk of falls? Yes   ASSISTIVE DEVICES UTILIZED TO PREVENT FALLS:  Life alert? No  Use of a cane, walker or w/c? No  Grab bars in the bathroom? Yes Shower chair or bench in shower? Yes  Elevated toilet seat or a handicapped toilet? No   DME ORDERS:  DME order needed?  No   TIMED UP AND GO:  Was the test performed? Yes .  Length of time to ambulate 10 feet: 5 sec.   GAIT:  Appearance of gait: Gait stead-fast and without the use of an assistive device.  Education: Fall risk prevention has been discussed.  Intervention(s) required? No    Depression Screen PHQ 2/9 Scores 05/04/2018 07/12/2017 07/20/2016 07/20/2016  PHQ - 2 Score 0 0 0 0  PHQ- 9 Score - - 1 -     Cognitive Function     6CIT Screen 05/04/2018 07/20/2016  What Year? 0 points 0 points  What month? 0 points 0 points  What time? 0 points 0 points  Count back  from 20 0 points 0 points  Months in reverse 0 points 0 points  Repeat phrase 0 points 2 points  Total Score 0 2    Immunization History  Administered Date(s) Administered  . Influenza, High Dose Seasonal PF 11/22/2017  .  Influenza-Unspecified 11/26/2011, 12/11/2014, 11/21/2015, 11/30/2016  . Pneumococcal Conjugate-13 04/24/2014, 03/02/2016  . Pneumococcal Polysaccharide-23 05/31/2010, 07/01/2012, 11/21/2015  . Pneumococcal-Unspecified 10/31/2012  . Tdap 08/13/2014  . Zoster 03/02/2010  . Zoster Recombinat (Shingrix) 07/03/2016, 08/30/2016    Qualifies for Shingles Vaccine? Yes  Shingrix completed 2018  Tdap: Up to date  Flu Vaccine: Up to date  Pneumococcal Vaccine: Up to date   Screening Tests Health Maintenance  Topic Date Due  . TETANUS/TDAP  08/12/2024  . INFLUENZA VACCINE  Completed  . DEXA SCAN  Completed  . PNA vac Low Risk Adult  Completed    Cancer Screenings:  Colorectal Screening:  No longer required.   Mammogram: Completed 01/01/18. Repeat every year.  Bone Density: Completed 07/22/16. Results reflect  OSTEOPENIA. Repeat every 2 years. Pt declined repeat screening at this time.   Lung Cancer Screening: (Low Dose CT Chest recommended if Age 91-80 years, 30 pack-year currently smoking OR have quit w/in 15years.) does not qualify.   Additional Screening:  Hepatitis C Screening: no longer required  Vision Screening: Recommended annual ophthalmology exams for early detection of glaucoma and other disorders of the eye. Is the patient up to date with their annual eye exam?  Yes  Who is the provider or what is the name of the office in which the pt attends annual eye exams? [facility]   Dental Screening: Recommended annual dental exams for proper oral hygiene  Community Resource Referral:  CRR required this visit?  No      Plan:     I have personally reviewed and addressed the Medicare Annual Wellness questionnaire and have noted the following in the patient's chart:  A. Medical and social history B. Use of alcohol, tobacco or illicit drugs  C. Current medications and supplements D. Functional ability and status E.  Nutritional status F.  Physical activity G. Advance  directives H. List of other physicians I.  Hospitalizations, surgeries, and ER visits in previous 12 months J.  [place] such as hearing and vision if needed, cognitive and depression L. Referrals and appointments   In addition, I have reviewed and discussed with patient certain preventive protocols, quality metrics, and best practice recommendations. A written personalized care plan for preventive services as well as general preventive health recommendations were provided to patient.   Signed,  Clemetine Marker, LPN Nurse Health Advisor   Nurse Notes: pt c/o pain in right ankle from fall 3 weeks ago in [place] parking lot. She states she has fallen before because of past sprains with this ankle and she plans to contact ortho for recommendations.

## 2018-05-04 NOTE — Patient Instructions (Signed)
Danielle Herrera , Thank you for taking time to come for your Medicare Wellness Visit. I appreciate your ongoing commitment to your health goals. Please review the following plan we discussed and let me know if I can assist you in the future.   Screening recommendations/referrals: Colonoscopy: no longer required Mammogram: done 01/21/18 Bone Density: done 07/22/16 Recommended yearly ophthalmology/optometry visit for glaucoma screening and checkup Recommended yearly dental visit for hygiene and checkup  Vaccinations: Influenza vaccine: done 11/22/17 Pneumococcal vaccine: done 2018 Tdap vaccine: done 08/13/14 Shingles vaccine: Shingrix discussed. Please contact your pharmacy for coverage information.   Conditions/risks identified: Keep up the great work!  Next appointment: Please follow up in one year for your Medicare Annual Wellness visit.     Preventive Care 1 Years and Older, Female Preventive care refers to lifestyle choices and visits with your health care provider that can promote health and wellness. What does preventive care include?  A yearly physical exam. This is also called an annual well check.  Dental exams once or twice a year.  Routine eye exams. Ask your health care provider how often you should have your eyes checked.  Personal lifestyle choices, including:  Daily care of your teeth and gums.  Regular physical activity.  Eating a healthy diet.  Avoiding tobacco and drug use.  Limiting alcohol use.  Practicing safe sex.  Taking low-dose aspirin every day.  Taking vitamin and mineral supplements as recommended by your health care provider. What happens during an annual well check? The services and screenings done by your health care provider during your annual well check will depend on your age, overall health, lifestyle risk factors, and family history of disease. Counseling  Your health care provider may ask you questions about your:  Alcohol  use.  Tobacco use.  Drug use.  Emotional well-being.  Home and relationship well-being.  Sexual activity.  Eating habits.  History of falls.  Memory and ability to understand (cognition).  Work and work Statistician.  Reproductive health. Screening  You may have the following tests or measurements:  Height, weight, and BMI.  Blood pressure.  Lipid and cholesterol levels. These may be checked every 5 years, or more frequently if you are over 20 years old.  Skin check.  Lung cancer screening. You may have this screening every year starting at age 83 if you have a 30-pack-year history of smoking and currently smoke or have quit within the past 15 years.  Fecal occult blood test (FOBT) of the stool. You may have this test every year starting at age 9.  Flexible sigmoidoscopy or colonoscopy. You may have a sigmoidoscopy every 5 years or a colonoscopy every 10 years starting at age 57.  Hepatitis C blood test.  Hepatitis B blood test.  Sexually transmitted disease (STD) testing.  Diabetes screening. This is done by checking your blood sugar (glucose) after you have not eaten for a while (fasting). You may have this done every 1-3 years.  Bone density scan. This is done to screen for osteoporosis. You may have this done starting at age 47.  Mammogram. This may be done every 1-2 years. Talk to your health care provider about how often you should have regular mammograms. Talk with your health care provider about your test results, treatment options, and if necessary, the need for more tests. Vaccines  Your health care provider may recommend certain vaccines, such as:  Influenza vaccine. This is recommended every year.  Tetanus, diphtheria, and acellular pertussis (Tdap, Td)  vaccine. You may need a Td booster every 10 years.  Zoster vaccine. You may need this after age 15.  Pneumococcal 13-valent conjugate (PCV13) vaccine. One dose is recommended after age  65.  Pneumococcal polysaccharide (PPSV23) vaccine. One dose is recommended after age 48. Talk to your health care provider about which screenings and vaccines you need and how often you need them. This information is not intended to replace advice given to you by your health care provider. Make sure you discuss any questions you have with your health care provider. Document Released: 03/15/2015 Document Revised: 11/06/2015 Document Reviewed: 12/18/2014 Elsevier Interactive Patient Education  2017 [place] Prevention in the Home Falls can cause injuries. They can happen to people of all ages. There are many things you can do to make your home safe and to help prevent falls. What can I do on the outside of my home?  Regularly fix the edges of walkways and driveways and fix any cracks.  Remove anything that might make you trip as you walk through a door, such as a raised step or threshold.  Trim any bushes or trees on the path to your home.  Use bright outdoor lighting.  Clear any walking paths of anything that might make someone trip, such as rocks or tools.  Regularly check to see if handrails are loose or broken. Make sure that both sides of any steps have handrails.  Any raised decks and porches should have guardrails on the edges.  Have any leaves, snow, or ice cleared regularly.  Use sand or salt on walking paths during winter.  Clean up any spills in your garage right away. This includes oil or grease spills. What can I do in the bathroom?  Use night lights.  Install grab bars by the toilet and in the tub and shower. Do not use towel bars as grab bars.  Use non-skid mats or decals in the tub or shower.  If you need to sit down in the shower, use a plastic, non-slip stool.  Keep the floor dry. Clean up any water that spills on the floor as soon as it happens.  Remove soap buildup in the tub or shower regularly.  Attach bath mats securely with double-sided  non-slip rug tape.  Do not have throw rugs and other things on the floor that can make you trip. What can I do in the bedroom?  Use night lights.  Make sure that you have a light by your bed that is easy to reach.  Do not use any sheets or blankets that are too big for your bed. They should not hang down onto the floor.  Have a firm chair that has side arms. You can use this for support while you get dressed.  Do not have throw rugs and other things on the floor that can make you trip. What can I do in the kitchen?  Clean up any spills right away.  Avoid walking on wet floors.  Keep items that you use a lot in easy-to-reach places.  If you need to reach something above you, use a strong step stool that has a grab bar.  Keep electrical cords out of the way.  Do not use floor polish or wax that makes floors slippery. If you must use wax, use non-skid floor wax.  Do not have throw rugs and other things on the floor that can make you trip. What can I do with my stairs?  Do not leave  any items on the stairs.  Make sure that there are handrails on both sides of the stairs and use them. Fix handrails that are broken or loose. Make sure that handrails are as long as the stairways.  Check any carpeting to make sure that it is firmly attached to the stairs. Fix any carpet that is loose or worn.  Avoid having throw rugs at the top or bottom of the stairs. If you do have throw rugs, attach them to the floor with carpet tape.  Make sure that you have a light switch at the top of the stairs and the bottom of the stairs. If you do not have them, ask someone to add them for you. What else can I do to help prevent falls?  Wear shoes that:  Do not have high heels.  Have rubber bottoms.  Are comfortable and fit you well.  Are closed at the toe. Do not wear sandals.  If you use a stepladder:  Make sure that it is fully opened. Do not climb a closed stepladder.  Make sure that both  sides of the stepladder are locked into place.  Ask someone to hold it for you, if possible.  Clearly mark and make sure that you can see:  Any grab bars or handrails.  First and last steps.  Where the edge of each step is.  Use tools that help you move around (mobility aids) if they are needed. These include:  Canes.  Walkers.  Scooters.  Crutches.  Turn on the lights when you go into a dark area. Replace any light bulbs as soon as they burn out.  Set up your furniture so you have a clear path. Avoid moving your furniture around.  If any of your floors are uneven, fix them.  If there are any pets around you, be aware of where they are.  Review your medicines with your doctor. Some medicines can make you feel dizzy. This can increase your chance of falling. Ask your doctor what other things that you can do to help prevent falls. This information is not intended to replace advice given to you by your health care provider. Make sure you discuss any questions you have with your health care provider. Document Released: 12/13/2008 Document Revised: 07/25/2015 Document Reviewed: 03/23/2014 Elsevier Interactive Patient Education  2017 [employer].

## 2018-05-10 DIAGNOSIS — M25571 Pain in right ankle and joints of right foot: Secondary | ICD-10-CM | POA: Diagnosis not present

## 2018-05-24 DIAGNOSIS — L578 Other skin changes due to chronic exposure to nonionizing radiation: Secondary | ICD-10-CM | POA: Diagnosis not present

## 2018-05-24 DIAGNOSIS — D485 Neoplasm of uncertain behavior of skin: Secondary | ICD-10-CM | POA: Diagnosis not present

## 2018-05-24 DIAGNOSIS — L814 Other melanin hyperpigmentation: Secondary | ICD-10-CM | POA: Diagnosis not present

## 2018-05-24 DIAGNOSIS — Z85828 Personal history of other malignant neoplasm of skin: Secondary | ICD-10-CM | POA: Diagnosis not present

## 2018-05-24 DIAGNOSIS — L570 Actinic keratosis: Secondary | ICD-10-CM | POA: Diagnosis not present

## 2018-05-24 DIAGNOSIS — Z1283 Encounter for screening for malignant neoplasm of skin: Secondary | ICD-10-CM | POA: Diagnosis not present

## 2018-05-24 DIAGNOSIS — Z872 Personal history of diseases of the skin and subcutaneous tissue: Secondary | ICD-10-CM | POA: Diagnosis not present

## 2018-05-24 DIAGNOSIS — L821 Other seborrheic keratosis: Secondary | ICD-10-CM | POA: Diagnosis not present

## 2018-07-12 ENCOUNTER — Encounter: Payer: Self-pay | Admitting: Internal Medicine

## 2018-07-13 ENCOUNTER — Encounter: Payer: Self-pay | Admitting: Internal Medicine

## 2018-07-13 ENCOUNTER — Other Ambulatory Visit: Payer: Self-pay

## 2018-07-13 ENCOUNTER — Ambulatory Visit (INDEPENDENT_AMBULATORY_CARE_PROVIDER_SITE_OTHER): Payer: Medicare Other | Admitting: Internal Medicine

## 2018-07-13 VITALS — BP 112/80 | HR 92 | Ht 61.0 in | Wt 133.0 lb

## 2018-07-13 DIAGNOSIS — G250 Essential tremor: Secondary | ICD-10-CM

## 2018-07-13 DIAGNOSIS — I10 Essential (primary) hypertension: Secondary | ICD-10-CM | POA: Diagnosis not present

## 2018-07-13 DIAGNOSIS — E042 Nontoxic multinodular goiter: Secondary | ICD-10-CM

## 2018-07-13 NOTE — Progress Notes (Signed)
Date:  07/13/2018   Name:  Danielle Herrera   DOB:  17-Jun-1933   MRN:  [id number]   Chief Complaint: Hypertension (6 month follow up.)  Hypertension  This is a chronic problem. The problem is controlled. Pertinent negatives include no chest pain, headaches, palpitations or shortness of breath. Past treatments include ACE inhibitors, diuretics and calcium channel blockers. The current treatment provides significant improvement. Identifiable causes of hypertension include a thyroid problem.  Thyroid Problem  Presents for follow-up visit. Symptoms include tremors (unchanged). Patient reports no diaphoresis, heat intolerance, hoarse voice, leg swelling, palpitations or weight gain. (Multiple thyroid nodules ) The symptoms have been stable.  Tremor - seen by neurology and felt to be benign essential tremor.  She was advised to drink a 1/2 glass of wine as needed.  She only notices the tremor at certain times and it does not interfere with her ADLs, and it is not worsening.  Lab Results  Component Value Date   CREATININE 1.09 (H) 01/12/2018   BUN 17 01/12/2018   NA 141 01/12/2018   K 4.0 01/12/2018   CL 102 01/12/2018   CO2 22 01/12/2018   Lab Results  Component Value Date   TSH 0.126 (L) 07/22/2012    Review of Systems  Constitutional: Negative for diaphoresis and weight gain.  HENT: Negative for hoarse voice.   Respiratory: Negative for cough, chest tightness, shortness of breath and wheezing.   Cardiovascular: Positive for leg swelling. Negative for chest pain and palpitations.  Gastrointestinal: Negative for abdominal pain.  Endocrine: Negative for heat intolerance.  Musculoskeletal: Negative for arthralgias and gait problem.  Neurological: Positive for tremors (unchanged). Negative for dizziness, light-headedness and headaches.    Patient Active Problem List   Diagnosis Date Noted  . Benign essential tremor 03/10/2018  . Insomnia 07/12/2017  . Trochanteric bursitis of  right hip 01/12/2017  . Constipation 08/11/2016  . Pseudophakia of right eye 12/12/2015  . History of parathyroidectomy 07/10/2015  . Hyperthyroidism, subclinical 04/22/2015  . Polypharmacy 02/19/2015  . 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 Hyperlipidemia, mixed 08/13/2014  . Vitamin D deficiency 08/13/2014  . Ventricular shunt in place 08/13/2014  . Esophageal reflux 08/13/2014  . Normal pressure hydrocephalus ([place]) 11/05/2012  . Abnormal gait 08/26/2012  . Goiter, nontoxic, multinodular 05/23/2012  . History of acoustic neuroma 01/20/2010  . Colon polyp 01/20/2010  . Dependent edema 01/20/2010  . Essential (primary) hypertension 01/20/2010  . Arthritis of knee, left 01/20/2010  . Osteopenia 01/20/2010    Allergies  Allergen Reactions  . Tylenol [Acetaminophen] Other (See Comments)    Elevated LFTs    Past Surgical History:  Procedure Laterality Date  . ABDOMINAL HYSTERECTOMY    . ACOUSTIC NEUROMA RESECTION Right 1985  . APPENDECTOMY    . BRAIN SURGERY  09/08/2012   burr hole with biopsy; cranial tongs caliper/stereotactic frame right (Dr. Ralene Cork)  . BREAST EXCISIONAL BIOPSY Left    benign  . BREAST EXCISIONAL BIOPSY Left    benign  . BREAST SURGERY Left    lumpectomy  . COLON SURGERY    . EXCISION/RELEASE BURSA HIP Right 10/29/2017   Procedure: EXCISION/RELEASE BURSA HIP- IT BAND;  Surgeon: Leim Fabry, MD;  Location: [facility] ORS;  Service: Orthopedics;  Laterality: Right;  . EYE SURGERY Right    cataract  . FRACTURE SURGERY Right    shoulder  . PARATHYROIDECTOMY  2017   partial for hyperparathyroidism  . SHOULDER ARTHROSCOPY W/ ACROMIAL REPAIR Right 34yrs ago  .  TUBAL LIGATION    . VENTRICULOPERITONEAL SHUNT Right 09/08/2012   for NPH    Social History   Tobacco Use  . Smoking status: Never Smoker  . Smokeless tobacco: Never Used  Substance Use Topics  . Alcohol use: Yes    Alcohol/week: 3.0 standard drinks    Types: 2 Glasses of wine, 1 Standard  drinks or equivalent per week  . Drug use: No     Medication list has been reviewed and updated.  Current Meds  Medication Sig  . amLODipine (NORVASC) 10 MG tablet TAKE 1 TABLET BY MOUTH EVERY DAY  . carboxymethylcellul-glycerin (OPTIVE) 0.5-0.9 % ophthalmic solution Place 1 drop into both eyes 2 (two) times daily.   . Cholecalciferol (VITAMIN D3) 2000 units TABS Take 1,000 Units by mouth daily.   . diphenhydrAMINE (BENADRYL) 25 MG tablet Take 25 mg by mouth at bedtime.  . furosemide (LASIX) 20 MG tablet Take 1 tablet (20 mg total) by mouth daily.  Marland Kitchen ibuprofen (ADVIL) 600 MG tablet TAKE 1 TABLET (600 MG TOTAL) BY MOUTH EVERY 8 (EIGHT) HOURS AS NEEDED FOR PAIN FOR UP TO 14 DAYS  . lisinopril (PRINIVIL,ZESTRIL) 10 MG tablet TAKE 1 TABLET BY MOUTH EVERY DAY  . methimazole (TAPAZOLE) 5 MG tablet TAKE 1 TABLET (5 MG TOTAL) BY MOUTH DAILY.  . Multiple Vitamin (MULTIVITAMIN WITH MINERALS) TABS tablet Take 1 tablet by mouth daily. Centrum Silver for Women 50+  . sennosides-docusate sodium (SENOKOT-S) 8.6-50 MG tablet Take 2 tablets by mouth 2 (two) times daily.     PHQ 2/9 Scores 07/13/2018 05/04/2018 07/12/2017 07/20/2016  PHQ - 2 Score 0 0 0 0  PHQ- 9 Score - - - 1    BP Readings from Last 3 Encounters:  07/13/18 112/80  05/04/18 136/78  01/12/18 112/70    Physical Exam Vitals signs and nursing note reviewed.  Constitutional:      General: She is not in acute distress.    Appearance: She is well-developed.  HENT:     Head: Normocephalic and atraumatic.  Neck:     Musculoskeletal: Normal range of motion and neck supple.  Cardiovascular:     Rate and Rhythm: Normal rate and regular rhythm.     Pulses: Normal pulses.  Pulmonary:     Effort: Pulmonary effort is normal. No respiratory distress.     Breath sounds: Normal breath sounds. No wheezing or rhonchi.  Musculoskeletal: Normal range of motion.     Right lower leg: No edema.     Left lower leg: No edema.  Lymphadenopathy:      Cervical: No cervical adenopathy.     Comments: Mildly tender varicose veins in right lower leg   Skin:    General: Skin is warm and dry.     Findings: No rash.  Neurological:     Mental Status: She is alert and oriented to person, place, and time.  Psychiatric:        Behavior: Behavior normal.        Thought Content: Thought content normal.     Wt Readings from Last 3 Encounters:  07/13/18 133 lb (60.3 kg)  05/04/18 133 lb (60.3 kg)  01/12/18 129 lb 12.8 oz (58.9 kg)    BP 112/80   Pulse 92   Ht 5\' 1"  (1.549 m)   Wt 133 lb (60.3 kg)   SpO2 99%   BMI 25.13 kg/m   Assessment and Plan: 1. Essential (primary) hypertension Controlled Mild varicose veins - pt reassured  2. Goiter, nontoxic, multinodular Stable; followed by endocrinology  3. Benign essential tremor Stable; no medications needed at this time   Partially dictated using [employer]. Any errors are unintentional.  Halina Maidens, MD [facility]  07/13/2018

## 2018-07-27 DIAGNOSIS — M955 Acquired deformity of pelvis: Secondary | ICD-10-CM | POA: Diagnosis not present

## 2018-07-27 DIAGNOSIS — M531 Cervicobrachial syndrome: Secondary | ICD-10-CM | POA: Diagnosis not present

## 2018-07-27 DIAGNOSIS — M9903 Segmental and somatic dysfunction of lumbar region: Secondary | ICD-10-CM | POA: Diagnosis not present

## 2018-07-27 DIAGNOSIS — M9905 Segmental and somatic dysfunction of pelvic region: Secondary | ICD-10-CM | POA: Diagnosis not present

## 2018-07-27 DIAGNOSIS — M5441 Lumbago with sciatica, right side: Secondary | ICD-10-CM | POA: Diagnosis not present

## 2018-07-27 DIAGNOSIS — M9901 Segmental and somatic dysfunction of cervical region: Secondary | ICD-10-CM | POA: Diagnosis not present

## 2018-08-24 DIAGNOSIS — M955 Acquired deformity of pelvis: Secondary | ICD-10-CM | POA: Diagnosis not present

## 2018-08-24 DIAGNOSIS — M9903 Segmental and somatic dysfunction of lumbar region: Secondary | ICD-10-CM | POA: Diagnosis not present

## 2018-08-24 DIAGNOSIS — M531 Cervicobrachial syndrome: Secondary | ICD-10-CM | POA: Diagnosis not present

## 2018-08-24 DIAGNOSIS — M9901 Segmental and somatic dysfunction of cervical region: Secondary | ICD-10-CM | POA: Diagnosis not present

## 2018-08-24 DIAGNOSIS — M5441 Lumbago with sciatica, right side: Secondary | ICD-10-CM | POA: Diagnosis not present

## 2018-08-24 DIAGNOSIS — M9905 Segmental and somatic dysfunction of pelvic region: Secondary | ICD-10-CM | POA: Diagnosis not present

## 2018-09-09 DIAGNOSIS — H9190 Unspecified hearing loss, unspecified ear: Secondary | ICD-10-CM | POA: Diagnosis not present

## 2018-09-09 DIAGNOSIS — E041 Nontoxic single thyroid nodule: Secondary | ICD-10-CM | POA: Diagnosis not present

## 2018-09-09 DIAGNOSIS — H6123 Impacted cerumen, bilateral: Secondary | ICD-10-CM | POA: Diagnosis not present

## 2018-09-21 DIAGNOSIS — M9903 Segmental and somatic dysfunction of lumbar region: Secondary | ICD-10-CM | POA: Diagnosis not present

## 2018-09-21 DIAGNOSIS — M955 Acquired deformity of pelvis: Secondary | ICD-10-CM | POA: Diagnosis not present

## 2018-09-21 DIAGNOSIS — M531 Cervicobrachial syndrome: Secondary | ICD-10-CM | POA: Diagnosis not present

## 2018-09-21 DIAGNOSIS — M5441 Lumbago with sciatica, right side: Secondary | ICD-10-CM | POA: Diagnosis not present

## 2018-09-21 DIAGNOSIS — M9905 Segmental and somatic dysfunction of pelvic region: Secondary | ICD-10-CM | POA: Diagnosis not present

## 2018-09-21 DIAGNOSIS — M9901 Segmental and somatic dysfunction of cervical region: Secondary | ICD-10-CM | POA: Diagnosis not present

## 2018-10-19 DIAGNOSIS — M5441 Lumbago with sciatica, right side: Secondary | ICD-10-CM | POA: Diagnosis not present

## 2018-10-19 DIAGNOSIS — M9903 Segmental and somatic dysfunction of lumbar region: Secondary | ICD-10-CM | POA: Diagnosis not present

## 2018-10-19 DIAGNOSIS — M531 Cervicobrachial syndrome: Secondary | ICD-10-CM | POA: Diagnosis not present

## 2018-10-19 DIAGNOSIS — M9901 Segmental and somatic dysfunction of cervical region: Secondary | ICD-10-CM | POA: Diagnosis not present

## 2018-10-19 DIAGNOSIS — M9905 Segmental and somatic dysfunction of pelvic region: Secondary | ICD-10-CM | POA: Diagnosis not present

## 2018-10-19 DIAGNOSIS — M955 Acquired deformity of pelvis: Secondary | ICD-10-CM | POA: Diagnosis not present

## 2018-11-15 DIAGNOSIS — L570 Actinic keratosis: Secondary | ICD-10-CM | POA: Diagnosis not present

## 2018-11-15 DIAGNOSIS — Z872 Personal history of diseases of the skin and subcutaneous tissue: Secondary | ICD-10-CM | POA: Diagnosis not present

## 2018-11-15 DIAGNOSIS — L578 Other skin changes due to chronic exposure to nonionizing radiation: Secondary | ICD-10-CM | POA: Diagnosis not present

## 2018-11-15 DIAGNOSIS — Z86018 Personal history of other benign neoplasm: Secondary | ICD-10-CM | POA: Diagnosis not present

## 2018-11-15 DIAGNOSIS — L821 Other seborrheic keratosis: Secondary | ICD-10-CM | POA: Diagnosis not present

## 2018-11-15 DIAGNOSIS — Z85828 Personal history of other malignant neoplasm of skin: Secondary | ICD-10-CM | POA: Diagnosis not present

## 2018-11-16 DIAGNOSIS — M9901 Segmental and somatic dysfunction of cervical region: Secondary | ICD-10-CM | POA: Diagnosis not present

## 2018-11-16 DIAGNOSIS — M9903 Segmental and somatic dysfunction of lumbar region: Secondary | ICD-10-CM | POA: Diagnosis not present

## 2018-11-16 DIAGNOSIS — M5441 Lumbago with sciatica, right side: Secondary | ICD-10-CM | POA: Diagnosis not present

## 2018-11-16 DIAGNOSIS — M9905 Segmental and somatic dysfunction of pelvic region: Secondary | ICD-10-CM | POA: Diagnosis not present

## 2018-11-16 DIAGNOSIS — M531 Cervicobrachial syndrome: Secondary | ICD-10-CM | POA: Diagnosis not present

## 2018-11-16 DIAGNOSIS — M955 Acquired deformity of pelvis: Secondary | ICD-10-CM | POA: Diagnosis not present

## 2018-11-22 DIAGNOSIS — M5431 Sciatica, right side: Secondary | ICD-10-CM | POA: Diagnosis not present

## 2018-12-21 DIAGNOSIS — M9905 Segmental and somatic dysfunction of pelvic region: Secondary | ICD-10-CM | POA: Diagnosis not present

## 2018-12-21 DIAGNOSIS — M955 Acquired deformity of pelvis: Secondary | ICD-10-CM | POA: Diagnosis not present

## 2018-12-21 DIAGNOSIS — M9901 Segmental and somatic dysfunction of cervical region: Secondary | ICD-10-CM | POA: Diagnosis not present

## 2018-12-21 DIAGNOSIS — M5441 Lumbago with sciatica, right side: Secondary | ICD-10-CM | POA: Diagnosis not present

## 2018-12-21 DIAGNOSIS — M9903 Segmental and somatic dysfunction of lumbar region: Secondary | ICD-10-CM | POA: Diagnosis not present

## 2018-12-21 DIAGNOSIS — M531 Cervicobrachial syndrome: Secondary | ICD-10-CM | POA: Diagnosis not present

## 2019-01-18 ENCOUNTER — Encounter: Payer: Self-pay | Admitting: Internal Medicine

## 2019-01-18 ENCOUNTER — Other Ambulatory Visit: Payer: Self-pay

## 2019-01-18 ENCOUNTER — Ambulatory Visit (INDEPENDENT_AMBULATORY_CARE_PROVIDER_SITE_OTHER): Payer: Medicare Other | Admitting: Internal Medicine

## 2019-01-18 VITALS — BP 128/80 | HR 82 | Ht 61.0 in | Wt 132.0 lb

## 2019-01-18 DIAGNOSIS — Z1231 Encounter for screening mammogram for malignant neoplasm of breast: Secondary | ICD-10-CM | POA: Diagnosis not present

## 2019-01-18 DIAGNOSIS — M531 Cervicobrachial syndrome: Secondary | ICD-10-CM | POA: Diagnosis not present

## 2019-01-18 DIAGNOSIS — G8929 Other chronic pain: Secondary | ICD-10-CM | POA: Diagnosis not present

## 2019-01-18 DIAGNOSIS — M955 Acquired deformity of pelvis: Secondary | ICD-10-CM | POA: Diagnosis not present

## 2019-01-18 DIAGNOSIS — I10 Essential (primary) hypertension: Secondary | ICD-10-CM | POA: Diagnosis not present

## 2019-01-18 DIAGNOSIS — M25551 Pain in right hip: Secondary | ICD-10-CM | POA: Diagnosis not present

## 2019-01-18 DIAGNOSIS — M5441 Lumbago with sciatica, right side: Secondary | ICD-10-CM | POA: Diagnosis not present

## 2019-01-18 DIAGNOSIS — E0590 Thyrotoxicosis, unspecified without thyrotoxic crisis or storm: Secondary | ICD-10-CM

## 2019-01-18 DIAGNOSIS — G250 Essential tremor: Secondary | ICD-10-CM | POA: Diagnosis not present

## 2019-01-18 DIAGNOSIS — M9905 Segmental and somatic dysfunction of pelvic region: Secondary | ICD-10-CM | POA: Diagnosis not present

## 2019-01-18 DIAGNOSIS — M9903 Segmental and somatic dysfunction of lumbar region: Secondary | ICD-10-CM | POA: Diagnosis not present

## 2019-01-18 DIAGNOSIS — M9901 Segmental and somatic dysfunction of cervical region: Secondary | ICD-10-CM | POA: Diagnosis not present

## 2019-01-18 LAB — POCT URINALYSIS DIPSTICK
Bilirubin, UA: NEGATIVE
Blood, UA: NEGATIVE
Glucose, UA: NEGATIVE
Ketones, UA: NEGATIVE
Leukocytes, UA: NEGATIVE
Nitrite, UA: NEGATIVE
Protein, UA: NEGATIVE
Spec Grav, UA: 1.01 (ref 1.010–1.025)
Urobilinogen, UA: 0.2 E.U./dL
pH, UA: 6 (ref 5.0–8.0)

## 2019-01-18 NOTE — Progress Notes (Signed)
Date:  01/18/2019   Name:  Danielle Herrera   DOB:  [birth year]   MRN:  [id number]   Chief Complaint: Medicare Yearly (Pt declined breast exam. ) Danielle Herrera is a 85 y.o. female who presents today for her annual exam. She feels fairly well. She reports exercising walking the dog. She reports she is sleeping fairly well.   Mammogram 12/2017 DEXA  2018 Colonoscopy Immunizations UTD  Hypertension This is a chronic problem. The problem is controlled (BP at home 130/80). Pertinent negatives include no chest pain, headaches, palpitations or shortness of breath. Past treatments include diuretics, beta blockers and ACE inhibitors. The current treatment provides significant improvement. Identifiable causes of hypertension include a thyroid problem.  Thyroid Problem Presents for follow-up visit. Symptoms include tremors (only affects applying makeup and writing sometimes). Patient reports no anxiety, constipation, diarrhea, fatigue or palpitations. (Being followed by endocrinology )  Hip Pain  There was no injury mechanism. The pain is present in the right hip. The quality of the pain is described as aching. The pain is moderate. The pain has been intermittent since onset. She has tried NSAIDs for the symptoms. The treatment provided mild relief.    Lab Results  Component Value Date   CREATININE 1.09 (H) 01/12/2018   BUN 17 01/12/2018   NA 141 01/12/2018   K 4.0 01/12/2018   CL 102 01/12/2018   CO2 22 01/12/2018   Lab Results  Component Value Date   CHOL 311 (H) 01/12/2018   HDL 100 01/12/2018   LDLCALC 181 (H) 01/12/2018   TRIG 149 01/12/2018   CHOLHDL 3.1 01/12/2018   Lab Results  Component Value Date   TSH 0.126 (L) 07/22/2012   No results found for: HGBA1C   Review of Systems  Constitutional: Negative for chills, fatigue and fever.  HENT: Negative for congestion, hearing loss, tinnitus, trouble swallowing and voice change.   Eyes: Negative for visual  disturbance.  Respiratory: Negative for cough, chest tightness, shortness of breath and wheezing.   Cardiovascular: Negative for chest pain, palpitations and leg swelling.  Gastrointestinal: Negative for abdominal pain, constipation, diarrhea and vomiting.  Endocrine: Negative for polydipsia and polyuria.  Genitourinary: Negative for dysuria, frequency, genital sores, vaginal bleeding and vaginal discharge.  Musculoskeletal: Positive for arthralgias (right hip and sciatica pain). Negative for gait problem and joint swelling.  Skin: Negative for color change and rash.  Neurological: Positive for tremors (only affects applying makeup and writing sometimes). Negative for dizziness, light-headedness and headaches.  Hematological: Negative for adenopathy. Does not bruise/bleed easily.  Psychiatric/Behavioral: Negative for dysphoric mood and sleep disturbance. The patient is not nervous/anxious.     Patient Active Problem List   Diagnosis Date Noted  . Benign essential tremor 03/10/2018  . Insomnia 07/12/2017  . Trochanteric bursitis of right hip 01/12/2017  . Constipation 08/11/2016  . Pseudophakia of right eye 12/12/2015  . History of parathyroidectomy 07/10/2015  . Hyperthyroidism, subclinical 04/22/2015  . Polypharmacy 02/19/2015  . Hyperlipidemia, mixed 08/13/2014  . Vitamin D deficiency 08/13/2014  . Ventricular shunt in place 08/13/2014  . Esophageal reflux 08/13/2014  . Normal pressure hydrocephalus ([place]) 11/05/2012  . Abnormal gait 08/26/2012  . Goiter, nontoxic, multinodular 05/23/2012  . History of acoustic neuroma 01/20/2010  . Colon polyp 01/20/2010  . Dependent edema 01/20/2010  . Essential (primary) hypertension 01/20/2010  . Arthritis of knee, left 01/20/2010  . Osteopenia 01/20/2010    Allergies  Allergen Reactions  . Tylenol [Acetaminophen] Other (See Comments)  Elevated LFTs    Past Surgical History:  Procedure Laterality Date  . ABDOMINAL HYSTERECTOMY     . ACOUSTIC NEUROMA RESECTION Right 1985  . APPENDECTOMY    . BRAIN SURGERY  09/08/2012   burr hole with biopsy; cranial tongs caliper/stereotactic frame right (Dr. Ralene Cork)  . BREAST EXCISIONAL BIOPSY Left    benign  . BREAST EXCISIONAL BIOPSY Left    benign  . BREAST SURGERY Left    lumpectomy  . COLON SURGERY    . EXCISION/RELEASE BURSA HIP Right 10/29/2017   Procedure: EXCISION/RELEASE BURSA HIP- IT BAND;  Surgeon: Leim Fabry, MD;  Location: [facility] ORS;  Service: Orthopedics;  Laterality: Right;  . EYE SURGERY Right    cataract  . FRACTURE SURGERY Right    shoulder  . PARATHYROIDECTOMY  2017   partial for hyperparathyroidism  . SHOULDER ARTHROSCOPY W/ ACROMIAL REPAIR Right 2yrs ago  . TUBAL LIGATION    . VENTRICULOPERITONEAL SHUNT Right 09/08/2012   for NPH    Social History   Tobacco Use  . Smoking status: Never Smoker  . Smokeless tobacco: Never Used  Substance Use Topics  . Alcohol use: Yes    Alcohol/week: 3.0 standard drinks    Types: 2 Glasses of wine, 1 Standard drinks or equivalent per week  . Drug use: No     Medication list has been reviewed and updated.  Current Meds  Medication Sig  . amLODipine (NORVASC) 10 MG tablet TAKE 1 TABLET BY MOUTH EVERY DAY  . carboxymethylcellul-glycerin (OPTIVE) 0.5-0.9 % ophthalmic solution Place 1 drop into both eyes 2 (two) times daily.   . celecoxib (CELEBREX) 200 MG capsule Take 200 mg by mouth 2 (two) times daily.  . Cholecalciferol (VITAMIN D3) 2000 units TABS Take 1,000 Units by mouth daily.   . diphenhydrAMINE (BENADRYL) 25 MG tablet Take 25 mg by mouth at bedtime.  . furosemide (LASIX) 20 MG tablet Take 1 tablet (20 mg total) by mouth daily.  Marland Kitchen ibuprofen (ADVIL) 600 MG tablet TAKE 1 TABLET (600 MG TOTAL) BY MOUTH EVERY 8 (EIGHT) HOURS AS NEEDED FOR PAIN FOR UP TO 14 DAYS  . lisinopril (PRINIVIL,ZESTRIL) 10 MG tablet TAKE 1 TABLET BY MOUTH EVERY DAY  . methimazole (TAPAZOLE) 5 MG tablet TAKE 1 TABLET (5  MG TOTAL) BY MOUTH DAILY.  . Multiple Vitamin (MULTIVITAMIN WITH MINERALS) TABS tablet Take 1 tablet by mouth daily. Centrum Silver for Women 50+  . sennosides-docusate sodium (SENOKOT-S) 8.6-50 MG tablet Take 2 tablets by mouth 2 (two) times daily.     PHQ 2/9 Scores 01/18/2019 07/13/2018 05/04/2018 07/12/2017  PHQ - 2 Score 0 0 0 0  PHQ- 9 Score - - - -    BP Readings from Last 3 Encounters:  01/18/19 128/80  07/13/18 112/80  05/04/18 136/78    Physical Exam Vitals signs and nursing note reviewed.  Constitutional:      General: She is not in acute distress.    Appearance: She is well-developed.  HENT:     Head: Normocephalic and atraumatic.     Right Ear: Tympanic membrane and ear canal normal.     Left Ear: Tympanic membrane and ear canal normal.     Nose:     Right Sinus: No maxillary sinus tenderness.     Left Sinus: No maxillary sinus tenderness.  Eyes:     General: No scleral icterus.       Right eye: No discharge.        Left  eye: No discharge.     Conjunctiva/sclera: Conjunctivae normal.  Neck:     Musculoskeletal: Normal range of motion. No erythema.     Thyroid: No thyromegaly.     Vascular: No carotid bruit.  Cardiovascular:     Rate and Rhythm: Normal rate and regular rhythm.     Pulses: Normal pulses.     Heart sounds: Normal heart sounds.  Pulmonary:     Effort: Pulmonary effort is normal. No respiratory distress.     Breath sounds: No wheezing.  Abdominal:     General: Bowel sounds are normal.     Palpations: Abdomen is soft.     Tenderness: There is no abdominal tenderness.  Musculoskeletal:     Right hip: She exhibits decreased range of motion and tenderness. She exhibits no crepitus.     Left hip: Normal. She exhibits no tenderness and no bony tenderness.  Lymphadenopathy:     Cervical: No cervical adenopathy.  Skin:    General: Skin is warm and dry.     Findings: No rash.  Neurological:     Mental Status: She is alert and oriented to person,  place, and time.     Cranial Nerves: No cranial nerve deficit.     Sensory: No sensory deficit.     Deep Tendon Reflexes: Reflexes are normal and symmetric.  Psychiatric:        Attention and Perception: Attention normal.        Mood and Affect: Mood normal.        Speech: Speech normal.        Behavior: Behavior normal.        Thought Content: Thought content normal.        Cognition and Memory: Cognition normal.        Judgment: Judgment normal.     Wt Readings from Last 3 Encounters:  01/18/19 132 lb (59.9 kg)  07/13/18 133 lb (60.3 kg)  05/04/18 133 lb (60.3 kg)    BP 128/80   Pulse 82   Ht 5\' 1"  (1.549 m)   Wt 132 lb (59.9 kg)   SpO2 97%   BMI 24.94 kg/m   Assessment and Plan: 1. Essential (primary) hypertension Clinically stable exam with well controlled BP.   Tolerating medications, amlodipine 10 mg, lisinopril 10 mg daily and lasix 20 mg daily, without side effects at this time. Pt to continue current regimen and low sodium diet; benefits of regular exercise as able discussed. - CBC with Differential/Platelet - Comprehensive metabolic panel - POCT urinalysis dipstick  2. Hyperthyroidism, subclinical With MNG - followed by Endocrinology Sx appear stable with mild sensation of throat fullness  3. Benign essential tremor Seen by Neurology - no treatment needed unless it worsens Pt can consume a glass of wine nightly to reduce sx  4. Hip pain, chronic, right Had surgery on the right hip bursa - did well for 2 weeks and now pain is back, limits her walking;  Often has to use a walker Would like to seek a second opinion since the previous Ortho did not think the issue was her hip - Ambulatory referral to Orthopedic Surgery  5. Encounter for screening mammogram for breast cancer Pt to schedule at [facility]; Future   Partially dictated using [employer]. Any errors are unintentional.  Halina Maidens, MD [facility]  01/18/2019

## 2019-01-19 LAB — CBC WITH DIFFERENTIAL/PLATELET
Basophils Absolute: 0.1 10*3/uL (ref 0.0–0.2)
Basos: 1 %
EOS (ABSOLUTE): 0.1 10*3/uL (ref 0.0–0.4)
Eos: 2 %
Hematocrit: 43.8 % (ref 34.0–46.6)
Hemoglobin: 14.9 g/dL (ref 11.1–15.9)
Immature Grans (Abs): 0 10*3/uL (ref 0.0–0.1)
Immature Granulocytes: 0 %
Lymphocytes Absolute: 2.6 10*3/uL (ref 0.7–3.1)
Lymphs: 36 %
MCH: 29.6 pg (ref 26.6–33.0)
MCHC: 34 g/dL (ref 31.5–35.7)
MCV: 87 fL (ref 79–97)
Monocytes Absolute: 0.6 10*3/uL (ref 0.1–0.9)
Monocytes: 8 %
Neutrophils Absolute: 3.8 10*3/uL (ref 1.4–7.0)
Neutrophils: 53 %
Platelets: 260 10*3/uL (ref 150–450)
RBC: 5.03 x10E6/uL (ref 3.77–5.28)
RDW: 12.6 % (ref 11.7–15.4)
WBC: 7.2 10*3/uL (ref 3.4–10.8)

## 2019-01-19 LAB — COMPREHENSIVE METABOLIC PANEL
ALT: 15 IU/L (ref 0–32)
AST: 22 IU/L (ref 0–40)
Albumin/Globulin Ratio: 1.7 (ref 1.2–2.2)
Albumin: 4.7 g/dL — ABNORMAL HIGH (ref 3.6–4.6)
Alkaline Phosphatase: 71 IU/L (ref 39–117)
BUN/Creatinine Ratio: 21 (ref 12–28)
BUN: 22 mg/dL (ref 8–27)
Bilirubin Total: 0.3 mg/dL (ref 0.0–1.2)
CO2: 24 mmol/L (ref 20–29)
Calcium: 10.4 mg/dL — ABNORMAL HIGH (ref 8.7–10.3)
Chloride: 102 mmol/L (ref 96–106)
Creatinine, Ser: 1.03 mg/dL — ABNORMAL HIGH (ref 0.57–1.00)
GFR calc Af Amer: 58 mL/min/{1.73_m2} — ABNORMAL LOW (ref >59)
GFR calc non Af Amer: 50 mL/min/{1.73_m2} — ABNORMAL LOW (ref >59)
Globulin, Total: 2.7 g/dL (ref 1.5–4.5)
Glucose: 104 mg/dL — ABNORMAL HIGH (ref 65–99)
Potassium: 4 mmol/L (ref 3.5–5.2)
Sodium: 143 mmol/L (ref 134–144)
Total Protein: 7.4 g/dL (ref 6.0–8.5)

## 2019-01-31 ENCOUNTER — Ambulatory Visit
Admission: RE | Admit: 2019-01-31 | Discharge: 2019-01-31 | Disposition: A | Discharge status: Home / Self Care | Payer: Medicare Other | Source: Ambulatory Visit | Attending: Internal Medicine | Admitting: Internal Medicine

## 2019-01-31 ENCOUNTER — Other Ambulatory Visit: Payer: Self-pay

## 2019-01-31 DIAGNOSIS — Z1231 Encounter for screening mammogram for malignant neoplasm of breast: Secondary | ICD-10-CM | POA: Insufficient documentation

## 2019-02-13 ENCOUNTER — Other Ambulatory Visit: Payer: Self-pay | Admitting: Internal Medicine

## 2019-02-13 DIAGNOSIS — E042 Nontoxic multinodular goiter: Secondary | ICD-10-CM | POA: Diagnosis not present

## 2019-02-13 DIAGNOSIS — E210 Primary hyperparathyroidism: Secondary | ICD-10-CM | POA: Diagnosis not present

## 2019-02-13 DIAGNOSIS — E0590 Thyrotoxicosis, unspecified without thyrotoxic crisis or storm: Secondary | ICD-10-CM | POA: Diagnosis not present

## 2019-02-17 ENCOUNTER — Other Ambulatory Visit: Payer: Self-pay | Admitting: Internal Medicine

## 2019-03-06 ENCOUNTER — Other Ambulatory Visit: Payer: Self-pay | Admitting: Internal Medicine

## 2019-03-15 DIAGNOSIS — Z23 Encounter for immunization: Secondary | ICD-10-CM | POA: Diagnosis not present

## 2019-03-29 DIAGNOSIS — M531 Cervicobrachial syndrome: Secondary | ICD-10-CM | POA: Diagnosis not present

## 2019-03-29 DIAGNOSIS — M9903 Segmental and somatic dysfunction of lumbar region: Secondary | ICD-10-CM | POA: Diagnosis not present

## 2019-03-29 DIAGNOSIS — M9901 Segmental and somatic dysfunction of cervical region: Secondary | ICD-10-CM | POA: Diagnosis not present

## 2019-03-29 DIAGNOSIS — M9905 Segmental and somatic dysfunction of pelvic region: Secondary | ICD-10-CM | POA: Diagnosis not present

## 2019-03-29 DIAGNOSIS — M5441 Lumbago with sciatica, right side: Secondary | ICD-10-CM | POA: Diagnosis not present

## 2019-04-06 DIAGNOSIS — Z23 Encounter for immunization: Secondary | ICD-10-CM | POA: Diagnosis not present

## 2019-04-27 DIAGNOSIS — M9903 Segmental and somatic dysfunction of lumbar region: Secondary | ICD-10-CM | POA: Diagnosis not present

## 2019-04-27 DIAGNOSIS — M531 Cervicobrachial syndrome: Secondary | ICD-10-CM | POA: Diagnosis not present

## 2019-04-27 DIAGNOSIS — M5441 Lumbago with sciatica, right side: Secondary | ICD-10-CM | POA: Diagnosis not present

## 2019-04-27 DIAGNOSIS — M9905 Segmental and somatic dysfunction of pelvic region: Secondary | ICD-10-CM | POA: Diagnosis not present

## 2019-04-27 DIAGNOSIS — M9901 Segmental and somatic dysfunction of cervical region: Secondary | ICD-10-CM | POA: Diagnosis not present

## 2019-05-10 ENCOUNTER — Ambulatory Visit (INDEPENDENT_AMBULATORY_CARE_PROVIDER_SITE_OTHER): Payer: Medicare Other

## 2019-05-10 ENCOUNTER — Other Ambulatory Visit: Payer: Self-pay

## 2019-05-10 VITALS — BP 120/80 | HR 84 | Temp 96.4°F | Resp 16 | Ht 61.0 in | Wt 133.0 lb

## 2019-05-10 DIAGNOSIS — Z0000 Encounter for general adult medical examination without abnormal findings: Secondary | ICD-10-CM

## 2019-05-10 NOTE — Progress Notes (Signed)
Subjective:   Danielle Herrera is a 86 y.o. female who presents for Medicare Annual (Subsequent) preventive examination.  Review of Systems:   Cardiac Risk Factors include: advanced age (>66men, >61 women);hypertension     Objective:     Vitals: BP 120/80 (BP Location: Left Arm, Patient Position: Sitting, Cuff Size: Normal)   Pulse 84   Temp (!) 96.4 F (35.8 C) (Temporal)   Resp 16   Ht 5\' 1"  (1.549 m)   Wt 133 lb (60.3 kg)   BMI 25.13 kg/m   Body mass index is 25.13 kg/m.  Advanced Directives 05/10/2019 05/04/2018 11/01/2017 11/01/2017 10/25/2017 07/20/2016 02/19/2015  Does Patient Have a Medical Advance Directive? Yes Yes Yes Yes Yes Yes Yes  Type of Paramedic of [place];Living will Living will;Healthcare Power of [place];Living will [place];Living will [place];Living will  Does patient want to make changes to medical advance directive? - - - - No - Patient declined - -  Copy of [place] in Chart? Yes - validated most recent copy scanned in chart (See row information) Yes - validated most recent copy scanned in chart (See row information) - - Yes No - copy requested No - copy requested    Tobacco Social History   Tobacco Use  Smoking Status Never Smoker  Smokeless Tobacco Never Used     Counseling given: Not Answered   Clinical Intake:  Pre-visit preparation completed: Yes  Pain : 0-10 Pain Score: 2  Pain Type: Chronic pain Pain Location: Hip Pain Orientation: Right Pain Descriptors / Indicators: Aching, Sore Pain Onset: More than a month ago Pain Frequency: Constant     BMI - recorded: 25.13 Nutritional Status: BMI 25 -29 Overweight Nutritional Risks: None Diabetes: No  How often do you need to have someone help you when you read instructions, pamphlets, or other written materials from your doctor or pharmacy?: 1 - Never  Interpreter  Needed?: No  Information entered by :: Clemetine Marker LPN  Past Medical History:  Diagnosis Date  . Allergy   . Arthritis of knee, left 01/20/2010  . Bone spur 2019   right hip   . Esophageal reflux 08/13/2014  . Fibrocystic breast disease   . Goiter, nontoxic, multinodular 05/23/2012  . H/O jaundice   . 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 Hepatitis    as a child from drinking bad water  . Hip pain, chronic, right   . HLD (hyperlipidemia) 08/13/2014  . Hyperparathyroidism, primary ([place]) 08/02/2013  . Hypertension   . Hyperthyroidism   . Multiple thyroid nodules   . Normal pressure hydrocephalus ([place]) 11/30/2013  . Osteopenia 01/20/2010  . Osteoporosis   . Pneumonia 1960's  . Thyroid disease   . Ventricular shunt in place 08/13/2014  . Vitamin D deficiency 08/13/2014   Past Surgical History:  Procedure Laterality Date  . ABDOMINAL HYSTERECTOMY    . ACOUSTIC NEUROMA RESECTION Right 1985  . APPENDECTOMY    . BRAIN SURGERY  09/08/2012   burr hole with biopsy; cranial tongs caliper/stereotactic frame right (Dr. Ralene Cork)  . BREAST EXCISIONAL BIOPSY Left    benign  . BREAST EXCISIONAL BIOPSY Left    benign  . BREAST SURGERY Left    lumpectomy  . CATARACT EXTRACTION Right   . COLON SURGERY    . EXCISION/RELEASE BURSA HIP Right 10/29/2017   Procedure: EXCISION/RELEASE BURSA HIP- IT BAND;  Surgeon: Leim Fabry,  MD;  Location: [facility] ORS;  Service: Orthopedics;  Laterality: Right;  . EYE SURGERY Right    cataract  . FRACTURE SURGERY Right    shoulder  . PARATHYROIDECTOMY  2017   partial for hyperparathyroidism  . SHOULDER ARTHROSCOPY W/ ACROMIAL REPAIR Right 39yrs ago  . TUBAL LIGATION    . VENTRICULOPERITONEAL SHUNT Right 09/08/2012   for NPH   Family History  Problem Relation Age of Onset  . Hypertension Mother   . Mental illness Mother   . Dementia Mother   . Heart disease Father   . Hypertension Father   . Diabetes Sister   . Stroke Brother   . Heart disease  Brother   . Breast cancer Neg Hx    Social History   Socioeconomic History  . Marital status: Widowed    Spouse name: Not on file  . Number of children: 0  . Years of education: Not on file  . Highest education level: 12th grade  Occupational History  . Occupation: retired  Tobacco Use  . Smoking status: Never Smoker  . Smokeless tobacco: Never Used  Substance and Sexual Activity  . Alcohol use: Yes    Alcohol/week: 3.0 standard drinks    Types: 2 Glasses of wine, 1 Standard drinks or equivalent per week    Comment: Occasionally  . Drug use: Never  . Sexual activity: Not Currently  Other Topics Concern  . Not on file  Social History Narrative  . Not on file   Social Determinants of Health   Financial Resource Strain: Low Risk   . Difficulty of Paying Living Expenses: Not hard at all  Food Insecurity: No Food Insecurity  . Worried About Charity fundraiser in the Last Year: Never true  . Ran Out of Food in the Last Year: Never true  Transportation Needs: No Transportation Needs  . Lack of Transportation (Medical): No  . Lack of Transportation (Non-Medical): No  Physical Activity: Inactive  . Days of Exercise per Week: 0 days  . Minutes of Exercise per Session: 0 min  Stress: No Stress Concern Present  . Feeling of Stress : Not at all  Social Connections: Somewhat Isolated  . Frequency of Communication with Friends and Family: More than three times a week  . Frequency of Social Gatherings with Friends and Family: Twice a week  . Attends Religious Services: More than 4 times per year  . Active Member of Clubs or Organizations: No  . Attends Archivist Meetings: Never  . Marital Status: Widowed    Outpatient Encounter Medications as of 05/10/2019  Medication Sig  . amLODipine (NORVASC) 10 MG tablet TAKE 1 TABLET BY MOUTH EVERY DAY  . carboxymethylcellul-glycerin (OPTIVE) 0.5-0.9 % ophthalmic solution Place 1 drop into both eyes 2 (two) times daily.   .  cholecalciferol (VITAMIN D3) 25 MCG (1000 UNIT) tablet Take 1,000 Units by mouth daily.  . diphenhydrAMINE (BENADRYL) 25 MG tablet Take 25 mg by mouth at bedtime.  . furosemide (LASIX) 20 MG tablet TAKE 1 TABLET BY MOUTH EVERY DAY  . lisinopril (ZESTRIL) 10 MG tablet TAKE 1 TABLET BY MOUTH EVERY DAY  . methimazole (TAPAZOLE) 5 MG tablet TAKE 1 TABLET (5 MG TOTAL) BY MOUTH DAILY.  . Multiple Vitamin (MULTIVITAMIN WITH MINERALS) TABS tablet Take 1 tablet by mouth daily. Centrum Silver for Women 50+  . naproxen sodium (ALEVE) 220 MG tablet Take 2 tablets by mouth daily.   . sennosides-docusate sodium (SENOKOT-S) 8.6-50 MG tablet Take  2 tablets by mouth 2 (two) times daily.   . [DISCONTINUED] celecoxib (CELEBREX) 200 MG capsule Take 200 mg by mouth 2 (two) times daily.  . [DISCONTINUED] Cholecalciferol (VITAMIN D3) 2000 units TABS Take 1,000 Units by mouth daily.   . [DISCONTINUED] ibuprofen (ADVIL) 600 MG tablet TAKE 1 TABLET (600 MG TOTAL) BY MOUTH EVERY 8 (EIGHT) HOURS AS NEEDED FOR PAIN FOR UP TO 14 DAYS   No facility-administered encounter medications on file as of 05/10/2019.    Activities of Daily Living In your present state of health, do you have any difficulty performing the following activities: 05/10/2019  Hearing? Y  Comment left hearing aid  Vision? N  Difficulty concentrating or making decisions? N  Walking or climbing stairs? Y  Dressing or bathing? N  Doing errands, shopping? N  Preparing Food and eating ? N  Using the Toilet? N  In the past six months, have you accidently leaked urine? N  Do you have problems with loss of bowel control? N  Managing your Medications? N  Managing your Finances? N  Housekeeping or managing your Housekeeping? N  Some recent data might be hidden    Patient Care Team: Glean Hess, MD as PCP - General (Internal Medicine) Lanier Clam, MD (Endocrinology) Ubaldo Glassing Javier Docker, MD as Consulting Physician (Cardiology) Diamond Nickel,  DO as Consulting Physician (Sports Medicine) Dionne Bucy, MD as Referring Physician (Ophthalmology)    Assessment:   This is a routine wellness examination for Jakayla.  Exercise Activities and Dietary recommendations Current Exercise Habits: The patient does not participate in regular exercise at present, Exercise limited by: orthopedic condition(s)  Goals    . Increase water intake     Recommend drinking 3-4 glasses of water a day.       Fall Risk Fall Risk  05/10/2019 01/18/2019 07/13/2018 05/04/2018 07/12/2017  Falls in the past year? 0 1 1 1  No  Number falls in past yr: 0 1 0 1 -  Injury with Fall? 0 1 1 1  -  Comment - - sprained ankle. sprained ankle -  Risk for fall due to : Impaired balance/gait;Orthopedic patient History of fall(s);Impaired balance/gait - Impaired balance/gait -  Follow up Falls prevention discussed Falls evaluation completed Falls evaluation completed Falls prevention discussed -   FALL RISK PREVENTION PERTAINING TO THE HOME:  Any stairs in or around the home? Yes  If so, do they handrails? No  1 outdoor step   Home free of loose throw rugs in walkways, pet beds, electrical cords, etc? Yes  Adequate lighting in your home to reduce risk of falls? Yes   ASSISTIVE DEVICES UTILIZED TO PREVENT FALLS:  Life alert? No  Use of a cane, walker or w/c? Yes  Grab bars in the bathroom? Yes  Shower chair or bench in shower? Yes  Elevated toilet seat or a handicapped toilet? Yes   DME ORDERS:  DME order needed?  No   TIMED UP AND GO:  Was the test performed? Yes .  Length of time to ambulate 10 feet: 5 sec.   GAIT:  Appearance of gait: Gait stead-fast and without the use of an assistive device.   Education: Fall risk prevention has been discussed.  Intervention(s) required? No    Depression Screen PHQ 2/9 Scores 05/10/2019 01/18/2019 07/13/2018 05/04/2018  PHQ - 2 Score 0 0 0 0  PHQ- 9 Score - - - -     Cognitive Function     6CIT Screen  05/10/2019 05/04/2018 07/20/2016  What Year? 0 points 0 points 0 points  What month? 0 points 0 points 0 points  What time? 0 points 0 points 0 points  Count back from 20 0 points 0 points 0 points  Months in reverse 0 points 0 points 0 points  Repeat phrase 0 points 0 points 2 points  Total Score 0 0 2    Immunization History  Administered Date(s) Administered  . Fluad Quad(high Dose 65+) 11/08/2018  . Influenza, High Dose Seasonal PF 11/22/2017  . Influenza-Unspecified 11/26/2011, 12/11/2014, 11/30/2016, 11/21/2018  . PFIZER SARS-COV-2 Vaccination 03/15/2019, 04/06/2019  . Pneumococcal Conjugate-13 04/24/2014, 03/02/2016  . Pneumococcal Polysaccharide-23 05/31/2010, 07/01/2012, 11/21/2015  . Pneumococcal-Unspecified 10/31/2012  . Tdap 08/13/2014  . Zoster 03/02/2010  . Zoster Recombinat (Shingrix) 07/03/2016, 08/30/2016    Qualifies for Shingles Vaccine? Up to date  Tdap: Up to date  Flu Vaccine: Up to date  Pneumococcal Vaccine: Up to date   Screening Tests Health Maintenance  Topic Date Due  . TETANUS/TDAP  08/12/2024  . INFLUENZA VACCINE  Completed  . DEXA SCAN  Completed  . PNA vac Low Risk Adult  Completed    Cancer Screenings:  Colorectal Screening:  No longer required.   Mammogram: Completed 01/31/19. Repeat every year.   Bone Density: Completed 07/22/16. Results reflect  OSTEOPENIA. Repeat every 2 years or as directed by your provider.  Lung Cancer Screening: (Low Dose CT Chest recommended if Age 41-80 years, 30 pack-year currently smoking OR have quit w/in 15years.) does not qualify.    Additional Screening:  Hepatitis C Screening: no longer required  Vision Screening: Recommended annual ophthalmology exams for early detection of glaucoma and other disorders of the eye. Is the patient up to date with their annual eye exam?  Yes Who is the provider or what is the name of the office in which the pt attends annual eye exams? Eye dr in [place]  Screening: Recommended annual dental exams for proper oral hygiene  Community Resource Referral:  CRR required this visit?  No      Plan:     I have personally reviewed and addressed the Medicare Annual Wellness questionnaire and have noted the following in the patient's chart:  A. Medical and social history B. Use of alcohol, tobacco or illicit drugs  C. Current medications and supplements D. Functional ability and status E.  Nutritional status F.  Physical activity G. Advance directives H. List of other physicians I.  Hospitalizations, surgeries, and ER visits in previous 12 months J.  [place] such as hearing and vision if needed, cognitive and depression L. Referrals and appointments   In addition, I have reviewed and discussed with patient certain preventive protocols, quality metrics, and best practice recommendations. A written personalized care plan for preventive services as well as general preventive health recommendations were provided to patient.   Signed,  Clemetine Marker, LPN Nurse Health Advisor   Nurse Notes: pt states she has been taking 2 Aleve daily QAM for the past 2 months and wants to make sure that will not affect kidney function. Pt advised kidney function stable at last visit in November. Please contact if patient needs labs earlier.

## 2019-05-10 NOTE — Patient Instructions (Signed)
Danielle Herrera , Thank you for taking time to come for your Medicare Wellness Visit. I appreciate your ongoing commitment to your health goals. Please review the following plan we discussed and let me know if I can assist you in the future.   Screening recommendations/referrals: Colonoscopy: no longer required Mammogram: done 01/31/19 Bone Density: done 07/22/16 Recommended yearly ophthalmology/optometry visit for glaucoma screening and checkup Recommended yearly dental visit for hygiene and checkup  Vaccinations: Influenza vaccine: done 11/21/18 Pneumococcal vaccine: done 2018 Tdap vaccine: done 08/13/14 Shingles vaccine: done 07/03/16 & 08/30/16   Covid-19: done 03/15/19 & 04/06/19  Conditions/risks identified: Recommend drinking 6-8 glasses of water per day  Next appointment: Please follow up in one year for your Medicare Annual Wellness visit.     Preventive Care 37 Years and Older, Female Preventive care refers to lifestyle choices and visits with your health care provider that can promote health and wellness. What does preventive care include?  A yearly physical exam. This is also called an annual well check.  Dental exams once or twice a year.  Routine eye exams. Ask your health care provider how often you should have your eyes checked.  Personal lifestyle choices, including:  Daily care of your teeth and gums.  Regular physical activity.  Eating a healthy diet.  Avoiding tobacco and drug use.  Limiting alcohol use.  Practicing safe sex.  Taking low-dose aspirin every day.  Taking vitamin and mineral supplements as recommended by your health care provider. What happens during an annual well check? The services and screenings done by your health care provider during your annual well check will depend on your age, overall health, lifestyle risk factors, and family history of disease. Counseling  Your health care provider may ask you questions about your:  Alcohol  use.  Tobacco use.  Drug use.  Emotional well-being.  Home and relationship well-being.  Sexual activity.  Eating habits.  History of falls.  Memory and ability to understand (cognition).  Work and work Statistician.  Reproductive health. Screening  You may have the following tests or measurements:  Height, weight, and BMI.  Blood pressure.  Lipid and cholesterol levels. These may be checked every 5 years, or more frequently if you are over 8 years old.  Skin check.  Lung cancer screening. You may have this screening every year starting at age 43 if you have a 30-pack-year history of smoking and currently smoke or have quit within the past 15 years.  Fecal occult blood test (FOBT) of the stool. You may have this test every year starting at age 96.  Flexible sigmoidoscopy or colonoscopy. You may have a sigmoidoscopy every 5 years or a colonoscopy every 10 years starting at age 36.  Hepatitis C blood test.  Hepatitis B blood test.  Sexually transmitted disease (STD) testing.  Diabetes screening. This is done by checking your blood sugar (glucose) after you have not eaten for a while (fasting). You may have this done every 1-3 years.  Bone density scan. This is done to screen for osteoporosis. You may have this done starting at age 11.  Mammogram. This may be done every 1-2 years. Talk to your health care provider about how often you should have regular mammograms. Talk with your health care provider about your test results, treatment options, and if necessary, the need for more tests. Vaccines  Your health care provider may recommend certain vaccines, such as:  Influenza vaccine. This is recommended every year.  Tetanus, diphtheria, and  acellular pertussis (Tdap, Td) vaccine. You may need a Td booster every 10 years.  Zoster vaccine. You may need this after age 6.  Pneumococcal 13-valent conjugate (PCV13) vaccine. One dose is recommended after age  26.  Pneumococcal polysaccharide (PPSV23) vaccine. One dose is recommended after age 52. Talk to your health care provider about which screenings and vaccines you need and how often you need them. This information is not intended to replace advice given to you by your health care provider. Make sure you discuss any questions you have with your health care provider. Document Released: 03/15/2015 Document Revised: 11/06/2015 Document Reviewed: 12/18/2014 Elsevier Interactive Patient Education  2017 [place] Prevention in the Home Falls can cause injuries. They can happen to people of all ages. There are many things you can do to make your home safe and to help prevent falls. What can I do on the outside of my home?  Regularly fix the edges of walkways and driveways and fix any cracks.  Remove anything that might make you trip as you walk through a door, such as a raised step or threshold.  Trim any bushes or trees on the path to your home.  Use bright outdoor lighting.  Clear any walking paths of anything that might make someone trip, such as rocks or tools.  Regularly check to see if handrails are loose or broken. Make sure that both sides of any steps have handrails.  Any raised decks and porches should have guardrails on the edges.  Have any leaves, snow, or ice cleared regularly.  Use sand or salt on walking paths during winter.  Clean up any spills in your garage right away. This includes oil or grease spills. What can I do in the bathroom?  Use night lights.  Install grab bars by the toilet and in the tub and shower. Do not use towel bars as grab bars.  Use non-skid mats or decals in the tub or shower.  If you need to sit down in the shower, use a plastic, non-slip stool.  Keep the floor dry. Clean up any water that spills on the floor as soon as it happens.  Remove soap buildup in the tub or shower regularly.  Attach bath mats securely with double-sided  non-slip rug tape.  Do not have throw rugs and other things on the floor that can make you trip. What can I do in the bedroom?  Use night lights.  Make sure that you have a light by your bed that is easy to reach.  Do not use any sheets or blankets that are too big for your bed. They should not hang down onto the floor.  Have a firm chair that has side arms. You can use this for support while you get dressed.  Do not have throw rugs and other things on the floor that can make you trip. What can I do in the kitchen?  Clean up any spills right away.  Avoid walking on wet floors.  Keep items that you use a lot in easy-to-reach places.  If you need to reach something above you, use a strong step stool that has a grab bar.  Keep electrical cords out of the way.  Do not use floor polish or wax that makes floors slippery. If you must use wax, use non-skid floor wax.  Do not have throw rugs and other things on the floor that can make you trip. What can I do with my stairs?  Do not leave any items on the stairs.  Make sure that there are handrails on both sides of the stairs and use them. Fix handrails that are broken or loose. Make sure that handrails are as long as the stairways.  Check any carpeting to make sure that it is firmly attached to the stairs. Fix any carpet that is loose or worn.  Avoid having throw rugs at the top or bottom of the stairs. If you do have throw rugs, attach them to the floor with carpet tape.  Make sure that you have a light switch at the top of the stairs and the bottom of the stairs. If you do not have them, ask someone to add them for you. What else can I do to help prevent falls?  Wear shoes that:  Do not have high heels.  Have rubber bottoms.  Are comfortable and fit you well.  Are closed at the toe. Do not wear sandals.  If you use a stepladder:  Make sure that it is fully opened. Do not climb a closed stepladder.  Make sure that both  sides of the stepladder are locked into place.  Ask someone to hold it for you, if possible.  Clearly mark and make sure that you can see:  Any grab bars or handrails.  First and last steps.  Where the edge of each step is.  Use tools that help you move around (mobility aids) if they are needed. These include:  Canes.  Walkers.  Scooters.  Crutches.  Turn on the lights when you go into a dark area. Replace any light bulbs as soon as they burn out.  Set up your furniture so you have a clear path. Avoid moving your furniture around.  If any of your floors are uneven, fix them.  If there are any pets around you, be aware of where they are.  Review your medicines with your doctor. Some medicines can make you feel dizzy. This can increase your chance of falling. Ask your doctor what other things that you can do to help prevent falls. This information is not intended to replace advice given to you by your health care provider. Make sure you discuss any questions you have with your health care provider. Document Released: 12/13/2008 Document Revised: 07/25/2015 Document Reviewed: 03/23/2014 Elsevier Interactive Patient Education  2017 [employer].

## 2019-05-24 DIAGNOSIS — M9905 Segmental and somatic dysfunction of pelvic region: Secondary | ICD-10-CM | POA: Diagnosis not present

## 2019-05-24 DIAGNOSIS — M9901 Segmental and somatic dysfunction of cervical region: Secondary | ICD-10-CM | POA: Diagnosis not present

## 2019-05-24 DIAGNOSIS — M5441 Lumbago with sciatica, right side: Secondary | ICD-10-CM | POA: Diagnosis not present

## 2019-05-24 DIAGNOSIS — M9903 Segmental and somatic dysfunction of lumbar region: Secondary | ICD-10-CM | POA: Diagnosis not present

## 2019-05-24 DIAGNOSIS — M531 Cervicobrachial syndrome: Secondary | ICD-10-CM | POA: Diagnosis not present

## 2019-05-26 ENCOUNTER — Other Ambulatory Visit: Payer: Self-pay

## 2019-05-26 ENCOUNTER — Encounter: Payer: Self-pay | Admitting: Internal Medicine

## 2019-05-26 ENCOUNTER — Ambulatory Visit (INDEPENDENT_AMBULATORY_CARE_PROVIDER_SITE_OTHER): Payer: Medicare Other | Admitting: Internal Medicine

## 2019-05-26 VITALS — BP 132/82 | HR 85 | Temp 97.1°F | Ht 61.0 in | Wt 133.0 lb

## 2019-05-26 DIAGNOSIS — I10 Essential (primary) hypertension: Secondary | ICD-10-CM

## 2019-05-26 DIAGNOSIS — Z791 Long term (current) use of non-steroidal anti-inflammatories (NSAID): Secondary | ICD-10-CM | POA: Insufficient documentation

## 2019-05-26 DIAGNOSIS — M7061 Trochanteric bursitis, right hip: Secondary | ICD-10-CM

## 2019-05-26 NOTE — Progress Notes (Signed)
Date:  05/26/2019   Name:  Danielle Herrera   DOB:  [birth year]   MRN:  [id number]   Chief Complaint: Hypertension (Follow up and kidney function recheck.)  Hypertension This is a chronic problem. The problem is controlled. Pertinent negatives include no chest pain, headaches, palpitations or shortness of breath. Past treatments include calcium channel blockers and ACE inhibitors. The current treatment provides significant improvement. Hypertensive end-organ damage includes kidney disease.  Hip Pain  There was no injury mechanism. The pain is present in the right hip (seen by Ortho; has chronic bursitis). The quality of the pain is described as burning. The pain has been fluctuating since onset. Pertinent negatives include no muscle weakness or numbness. The symptoms are aggravated by movement and weight bearing. She has tried NSAIDs (and now has a [place] crutch) for the symptoms. Improvement on treatment: able to function with Aleve 2 tabs every morning.    Lab Results  Component Value Date   CREATININE 1.03 (H) 01/18/2019   BUN 22 01/18/2019   NA 143 01/18/2019   K 4.0 01/18/2019   CL 102 01/18/2019   CO2 24 01/18/2019   Lab Results  Component Value Date   CHOL 311 (H) 01/12/2018   HDL 100 01/12/2018   LDLCALC 181 (H) 01/12/2018   TRIG 149 01/12/2018   CHOLHDL 3.1 01/12/2018   Lab Results  Component Value Date   TSH 0.126 (L) 07/22/2012   No results found for: HGBA1C Lab Results  Component Value Date   WBC 7.2 01/18/2019   HGB 14.9 01/18/2019   HCT 43.8 01/18/2019   MCV 87 01/18/2019   PLT 260 01/18/2019   Lab Results  Component Value Date   ALT 15 01/18/2019   AST 22 01/18/2019   ALKPHOS 71 01/18/2019   BILITOT 0.3 01/18/2019     Review of Systems  Constitutional: Negative for chills, fatigue and fever.  Respiratory: Negative for cough, chest tightness, shortness of breath and wheezing.   Cardiovascular: Negative for chest pain, palpitations and leg  swelling.  Musculoskeletal: Positive for arthralgias and gait problem.  Neurological: Negative for dizziness, numbness and headaches.    Patient Active Problem List   Diagnosis Date Noted  . Benign essential tremor 03/10/2018  . Insomnia 07/12/2017  . Trochanteric bursitis of right hip 01/12/2017  . Constipation 08/11/2016  . Pseudophakia of right eye 12/12/2015  . History of parathyroidectomy 07/10/2015  . Hyperthyroidism, subclinical 04/22/2015  . Polypharmacy 02/19/2015  . Hyperlipidemia, mixed 08/13/2014  . Vitamin D deficiency 08/13/2014  . Ventricular shunt in place 08/13/2014  . Esophageal reflux 08/13/2014  . Normal pressure hydrocephalus ([place]) 11/05/2012  . Abnormal gait 08/26/2012  . Goiter, nontoxic, multinodular 05/23/2012  . History of acoustic neuroma 01/20/2010  . Colon polyp 01/20/2010  . Dependent edema 01/20/2010  . Essential (primary) hypertension 01/20/2010  . Arthritis of knee, left 01/20/2010  . Osteopenia 01/20/2010    Allergies  Allergen Reactions  . Tylenol [Acetaminophen] Other (See Comments)    Elevated LFTs    Past Surgical History:  Procedure Laterality Date  . ABDOMINAL HYSTERECTOMY    . ACOUSTIC NEUROMA RESECTION Right 1985  . APPENDECTOMY    . BRAIN SURGERY  09/08/2012   burr hole with biopsy; cranial tongs caliper/stereotactic frame right (Dr. Ralene Cork)  . BREAST EXCISIONAL BIOPSY Left    benign  . BREAST EXCISIONAL BIOPSY Left    benign  . BREAST SURGERY Left    lumpectomy  . CATARACT EXTRACTION  Right   . COLON SURGERY    . EXCISION/RELEASE BURSA HIP Right 10/29/2017   Procedure: EXCISION/RELEASE BURSA HIP- IT BAND;  Surgeon: Leim Fabry, MD;  Location: [facility] ORS;  Service: Orthopedics;  Laterality: Right;  . EYE SURGERY Right    cataract  . FRACTURE SURGERY Right    shoulder  . PARATHYROIDECTOMY  2017   partial for hyperparathyroidism  . SHOULDER ARTHROSCOPY W/ ACROMIAL REPAIR Right 70yrs ago  . TUBAL LIGATION      . VENTRICULOPERITONEAL SHUNT Right 09/08/2012   for NPH    Social History   Tobacco Use  . Smoking status: Never Smoker  . Smokeless tobacco: Never Used  Substance Use Topics  . Alcohol use: Yes    Alcohol/week: 3.0 standard drinks    Types: 2 Glasses of wine, 1 Standard drinks or equivalent per week    Comment: Occasionally  . Drug use: Never     Medication list has been reviewed and updated.  Current Meds  Medication Sig  . amLODipine (NORVASC) 10 MG tablet TAKE 1 TABLET BY MOUTH EVERY DAY  . carboxymethylcellul-glycerin (OPTIVE) 0.5-0.9 % ophthalmic solution Place 1 drop into both eyes 2 (two) times daily.   . cholecalciferol (VITAMIN D3) 25 MCG (1000 UNIT) tablet Take 1,000 Units by mouth daily.  . diphenhydrAMINE (BENADRYL) 25 MG tablet Take 25 mg by mouth at bedtime.  . furosemide (LASIX) 20 MG tablet TAKE 1 TABLET BY MOUTH EVERY DAY  . lisinopril (ZESTRIL) 10 MG tablet TAKE 1 TABLET BY MOUTH EVERY DAY  . methimazole (TAPAZOLE) 5 MG tablet TAKE 1 TABLET (5 MG TOTAL) BY MOUTH DAILY.  . Multiple Vitamin (MULTIVITAMIN WITH MINERALS) TABS tablet Take 1 tablet by mouth daily. Centrum Silver for Women 50+  . naproxen sodium (ALEVE) 220 MG tablet Take 2 tablets by mouth daily.   . sennosides-docusate sodium (SENOKOT-S) 8.6-50 MG tablet Take 2 tablets by mouth 2 (two) times daily.     PHQ 2/9 Scores 05/26/2019 05/10/2019 01/18/2019 07/13/2018  PHQ - 2 Score 0 0 0 0  PHQ- 9 Score 0 - - -    BP Readings from Last 3 Encounters:  05/26/19 132/82  05/10/19 120/80  01/18/19 128/80    Physical Exam Vitals and nursing note reviewed.  Constitutional:      General: She is not in acute distress.    Appearance: Normal appearance. She is well-developed.  HENT:     Head: Normocephalic and atraumatic.  Cardiovascular:     Rate and Rhythm: Normal rate and regular rhythm.  Pulmonary:     Effort: Pulmonary effort is normal. No respiratory distress.     Breath sounds: No wheezing or  rhonchi.  Musculoskeletal:     Cervical back: Normal range of motion.     Right lower leg: No edema.     Left lower leg: No edema.  Lymphadenopathy:     Cervical: No cervical adenopathy.  Skin:    General: Skin is warm and dry.     Capillary Refill: Capillary refill takes less than 2 seconds.     Findings: No rash.  Neurological:     Mental Status: She is alert and oriented to person, place, and time.     Gait: Gait abnormal (walks with crutches).  Psychiatric:        Behavior: Behavior normal.        Thought Content: Thought content normal.     Wt Readings from Last 3 Encounters:  05/26/19 133 lb (60.3 kg)  05/10/19 133 lb (60.3 kg)  01/18/19 132 lb (59.9 kg)    BP 132/82   Pulse 85   Temp (!) 97.1 F (36.2 C) (Temporal)   Ht 5\' 1"  (1.549 m)   Wt 133 lb (60.3 kg)   SpO2 97%   BMI 25.13 kg/m   Assessment and Plan: 1. Essential (primary) hypertension Clinically stable exam with well controlled BP on lisinopril and amlodipine. Tolerating medications without side effects at this time. Pt to continue current regimen and low sodium diet; benefits of regular exercise as able discussed.  2. Encounter for long-term (current) use of NSAIDs Continue to monitor renal function as long as she continues daily nsaids - Basic metabolic panel  3. Trochanteric bursitis of right hip Using Aleve 2 per day with fair control of pain Able to walk her dogs; using crutch or walker as needed   Partially dictated using Editor, commissioning. Any errors are unintentional.  Halina Maidens, MD [facility]  05/26/2019

## 2019-05-27 LAB — BASIC METABOLIC PANEL
BUN/Creatinine Ratio: 15 (ref 12–28)
BUN: 16 mg/dL (ref 8–27)
CO2: 24 mmol/L (ref 20–29)
Calcium: 10.2 mg/dL (ref 8.7–10.3)
Chloride: 105 mmol/L (ref 96–106)
Creatinine, Ser: 1.06 mg/dL — ABNORMAL HIGH (ref 0.57–1.00)
GFR calc Af Amer: 56 mL/min/{1.73_m2} — ABNORMAL LOW (ref >59)
GFR calc non Af Amer: 49 mL/min/{1.73_m2} — ABNORMAL LOW (ref >59)
Glucose: 89 mg/dL (ref 65–99)
Potassium: 4.3 mmol/L (ref 3.5–5.2)
Sodium: 143 mmol/L (ref 134–144)

## 2019-06-21 DIAGNOSIS — M9903 Segmental and somatic dysfunction of lumbar region: Secondary | ICD-10-CM | POA: Diagnosis not present

## 2019-06-21 DIAGNOSIS — M9905 Segmental and somatic dysfunction of pelvic region: Secondary | ICD-10-CM | POA: Diagnosis not present

## 2019-06-21 DIAGNOSIS — M5441 Lumbago with sciatica, right side: Secondary | ICD-10-CM | POA: Diagnosis not present

## 2019-06-21 DIAGNOSIS — M531 Cervicobrachial syndrome: Secondary | ICD-10-CM | POA: Diagnosis not present

## 2019-06-21 DIAGNOSIS — M9901 Segmental and somatic dysfunction of cervical region: Secondary | ICD-10-CM | POA: Diagnosis not present

## 2019-07-13 DIAGNOSIS — H0288B Meibomian gland dysfunction left eye, upper and lower eyelids: Secondary | ICD-10-CM | POA: Diagnosis not present

## 2019-07-13 DIAGNOSIS — H0288A Meibomian gland dysfunction right eye, upper and lower eyelids: Secondary | ICD-10-CM | POA: Diagnosis not present

## 2019-07-13 DIAGNOSIS — H40032 Anatomical narrow angle, left eye: Secondary | ICD-10-CM | POA: Diagnosis not present

## 2019-07-13 DIAGNOSIS — H353132 Nonexudative age-related macular degeneration, bilateral, intermediate dry stage: Secondary | ICD-10-CM | POA: Diagnosis not present

## 2019-07-13 DIAGNOSIS — H2512 Age-related nuclear cataract, left eye: Secondary | ICD-10-CM | POA: Diagnosis not present

## 2019-07-19 DIAGNOSIS — M531 Cervicobrachial syndrome: Secondary | ICD-10-CM | POA: Diagnosis not present

## 2019-07-19 DIAGNOSIS — M9901 Segmental and somatic dysfunction of cervical region: Secondary | ICD-10-CM | POA: Diagnosis not present

## 2019-07-19 DIAGNOSIS — M9905 Segmental and somatic dysfunction of pelvic region: Secondary | ICD-10-CM | POA: Diagnosis not present

## 2019-07-19 DIAGNOSIS — M5441 Lumbago with sciatica, right side: Secondary | ICD-10-CM | POA: Diagnosis not present

## 2019-07-19 DIAGNOSIS — M9903 Segmental and somatic dysfunction of lumbar region: Secondary | ICD-10-CM | POA: Diagnosis not present

## 2019-07-27 DIAGNOSIS — M25551 Pain in right hip: Secondary | ICD-10-CM | POA: Diagnosis not present

## 2019-07-27 DIAGNOSIS — M76891 Other specified enthesopathies of right lower limb, excluding foot: Secondary | ICD-10-CM | POA: Diagnosis not present

## 2019-07-27 DIAGNOSIS — M7061 Trochanteric bursitis, right hip: Secondary | ICD-10-CM | POA: Diagnosis not present

## 2019-07-27 DIAGNOSIS — G8929 Other chronic pain: Secondary | ICD-10-CM | POA: Diagnosis not present

## 2019-07-28 DIAGNOSIS — M5441 Lumbago with sciatica, right side: Secondary | ICD-10-CM | POA: Diagnosis not present

## 2019-07-28 DIAGNOSIS — M9905 Segmental and somatic dysfunction of pelvic region: Secondary | ICD-10-CM | POA: Diagnosis not present

## 2019-07-28 DIAGNOSIS — M9901 Segmental and somatic dysfunction of cervical region: Secondary | ICD-10-CM | POA: Diagnosis not present

## 2019-07-28 DIAGNOSIS — M531 Cervicobrachial syndrome: Secondary | ICD-10-CM | POA: Diagnosis not present

## 2019-07-28 DIAGNOSIS — M9903 Segmental and somatic dysfunction of lumbar region: Secondary | ICD-10-CM | POA: Diagnosis not present

## 2019-09-14 DIAGNOSIS — M5441 Lumbago with sciatica, right side: Secondary | ICD-10-CM | POA: Diagnosis not present

## 2019-09-14 DIAGNOSIS — M9903 Segmental and somatic dysfunction of lumbar region: Secondary | ICD-10-CM | POA: Diagnosis not present

## 2019-09-14 DIAGNOSIS — M9905 Segmental and somatic dysfunction of pelvic region: Secondary | ICD-10-CM | POA: Diagnosis not present

## 2019-09-14 DIAGNOSIS — M9901 Segmental and somatic dysfunction of cervical region: Secondary | ICD-10-CM | POA: Diagnosis not present

## 2019-09-14 DIAGNOSIS — M531 Cervicobrachial syndrome: Secondary | ICD-10-CM | POA: Diagnosis not present

## 2019-09-20 DIAGNOSIS — H40032 Anatomical narrow angle, left eye: Secondary | ICD-10-CM | POA: Diagnosis not present

## 2019-09-20 DIAGNOSIS — H52212 Irregular astigmatism, left eye: Secondary | ICD-10-CM | POA: Diagnosis not present

## 2019-09-20 DIAGNOSIS — H25012 Cortical age-related cataract, left eye: Secondary | ICD-10-CM | POA: Diagnosis not present

## 2019-09-20 DIAGNOSIS — H353132 Nonexudative age-related macular degeneration, bilateral, intermediate dry stage: Secondary | ICD-10-CM | POA: Diagnosis not present

## 2019-09-20 DIAGNOSIS — H2512 Age-related nuclear cataract, left eye: Secondary | ICD-10-CM | POA: Diagnosis not present

## 2019-10-24 DIAGNOSIS — M5441 Lumbago with sciatica, right side: Secondary | ICD-10-CM | POA: Diagnosis not present

## 2019-10-24 DIAGNOSIS — M531 Cervicobrachial syndrome: Secondary | ICD-10-CM | POA: Diagnosis not present

## 2019-10-24 DIAGNOSIS — M9903 Segmental and somatic dysfunction of lumbar region: Secondary | ICD-10-CM | POA: Diagnosis not present

## 2019-10-24 DIAGNOSIS — M9905 Segmental and somatic dysfunction of pelvic region: Secondary | ICD-10-CM | POA: Diagnosis not present

## 2019-10-24 DIAGNOSIS — M9901 Segmental and somatic dysfunction of cervical region: Secondary | ICD-10-CM | POA: Diagnosis not present

## 2019-11-15 DIAGNOSIS — Z86018 Personal history of other benign neoplasm: Secondary | ICD-10-CM | POA: Diagnosis not present

## 2019-11-15 DIAGNOSIS — Z872 Personal history of diseases of the skin and subcutaneous tissue: Secondary | ICD-10-CM | POA: Diagnosis not present

## 2019-11-15 DIAGNOSIS — Z85828 Personal history of other malignant neoplasm of skin: Secondary | ICD-10-CM | POA: Diagnosis not present

## 2019-11-15 DIAGNOSIS — L578 Other skin changes due to chronic exposure to nonionizing radiation: Secondary | ICD-10-CM | POA: Diagnosis not present

## 2019-11-15 DIAGNOSIS — L570 Actinic keratosis: Secondary | ICD-10-CM | POA: Diagnosis not present

## 2019-11-15 DIAGNOSIS — I781 Nevus, non-neoplastic: Secondary | ICD-10-CM | POA: Diagnosis not present

## 2019-11-22 DIAGNOSIS — M9905 Segmental and somatic dysfunction of pelvic region: Secondary | ICD-10-CM | POA: Diagnosis not present

## 2019-11-22 DIAGNOSIS — M531 Cervicobrachial syndrome: Secondary | ICD-10-CM | POA: Diagnosis not present

## 2019-11-22 DIAGNOSIS — M9901 Segmental and somatic dysfunction of cervical region: Secondary | ICD-10-CM | POA: Diagnosis not present

## 2019-11-22 DIAGNOSIS — M9903 Segmental and somatic dysfunction of lumbar region: Secondary | ICD-10-CM | POA: Diagnosis not present

## 2019-11-22 DIAGNOSIS — M5441 Lumbago with sciatica, right side: Secondary | ICD-10-CM | POA: Diagnosis not present

## 2019-11-27 DIAGNOSIS — Z23 Encounter for immunization: Secondary | ICD-10-CM | POA: Diagnosis not present

## 2019-12-05 DIAGNOSIS — H25012 Cortical age-related cataract, left eye: Secondary | ICD-10-CM | POA: Diagnosis not present

## 2019-12-05 DIAGNOSIS — H25812 Combined forms of age-related cataract, left eye: Secondary | ICD-10-CM | POA: Diagnosis not present

## 2019-12-05 DIAGNOSIS — H2512 Age-related nuclear cataract, left eye: Secondary | ICD-10-CM | POA: Diagnosis not present

## 2019-12-06 DIAGNOSIS — H269 Unspecified cataract: Secondary | ICD-10-CM

## 2019-12-06 HISTORY — DX: Unspecified cataract: H26.9

## 2019-12-20 DIAGNOSIS — M9903 Segmental and somatic dysfunction of lumbar region: Secondary | ICD-10-CM | POA: Diagnosis not present

## 2019-12-20 DIAGNOSIS — M531 Cervicobrachial syndrome: Secondary | ICD-10-CM | POA: Diagnosis not present

## 2019-12-20 DIAGNOSIS — M5441 Lumbago with sciatica, right side: Secondary | ICD-10-CM | POA: Diagnosis not present

## 2019-12-20 DIAGNOSIS — M9901 Segmental and somatic dysfunction of cervical region: Secondary | ICD-10-CM | POA: Diagnosis not present

## 2019-12-20 DIAGNOSIS — M9905 Segmental and somatic dysfunction of pelvic region: Secondary | ICD-10-CM | POA: Diagnosis not present

## 2020-01-23 ENCOUNTER — Encounter: Payer: Self-pay | Admitting: Internal Medicine

## 2020-01-23 ENCOUNTER — Other Ambulatory Visit: Payer: Self-pay

## 2020-01-23 ENCOUNTER — Ambulatory Visit (INDEPENDENT_AMBULATORY_CARE_PROVIDER_SITE_OTHER): Payer: Medicare Other | Admitting: Internal Medicine

## 2020-01-23 VITALS — BP 132/86 | HR 86 | Temp 97.6°F | Ht 61.0 in | Wt 131.0 lb

## 2020-01-23 DIAGNOSIS — R609 Edema, unspecified: Secondary | ICD-10-CM

## 2020-01-23 DIAGNOSIS — E0590 Thyrotoxicosis, unspecified without thyrotoxic crisis or storm: Secondary | ICD-10-CM

## 2020-01-23 DIAGNOSIS — E782 Mixed hyperlipidemia: Secondary | ICD-10-CM | POA: Diagnosis not present

## 2020-01-23 DIAGNOSIS — Z1231 Encounter for screening mammogram for malignant neoplasm of breast: Secondary | ICD-10-CM

## 2020-01-23 DIAGNOSIS — I10 Essential (primary) hypertension: Secondary | ICD-10-CM

## 2020-01-23 DIAGNOSIS — G250 Essential tremor: Secondary | ICD-10-CM | POA: Diagnosis not present

## 2020-01-23 DIAGNOSIS — E892 Postprocedural hypoparathyroidism: Secondary | ICD-10-CM

## 2020-01-23 DIAGNOSIS — K5901 Slow transit constipation: Secondary | ICD-10-CM | POA: Insufficient documentation

## 2020-01-23 LAB — POCT URINALYSIS DIPSTICK
Bilirubin, UA: NEGATIVE
Blood, UA: NEGATIVE
Glucose, UA: NEGATIVE
Ketones, UA: NEGATIVE
Leukocytes, UA: NEGATIVE
Nitrite, UA: NEGATIVE
Protein, UA: NEGATIVE
Spec Grav, UA: 1.015 (ref 1.010–1.025)
Urobilinogen, UA: 0.2 E.U./dL
pH, UA: 6 (ref 5.0–8.0)

## 2020-01-23 NOTE — Progress Notes (Signed)
Date:  01/23/2020   Name:  Danielle Herrera   DOB:  1933-04-12   MRN:  [id number]   Chief Complaint: Annual Exam (breast exam no pap)  Danielle Herrera is a 86 y.o. female who presents today for her Complete Annual Exam. She feels well. She reports exercising none. She reports she is sleeping fairly well. Breast complaints none. She is walking with a crutch to help her right hip bursitis but not improving.  Mammogram: 01/2019 DEXA: 06/2016 Colonoscopy: aged out  Immunization History  Administered Date(s) Administered  . Fluad Quad(high Dose 65+) 11/08/2018  . Influenza, High Dose Seasonal PF 11/22/2017, 11/07/2019  . Influenza-Unspecified 11/26/2011, 12/11/2014, 11/30/2016, 11/21/2018  . PFIZER SARS-COV-2 Vaccination 03/15/2019, 04/06/2019, 11/27/2019  . Pneumococcal Conjugate-13 04/24/2014, 03/02/2016  . Pneumococcal Polysaccharide-23 05/31/2010, 07/01/2012, 11/21/2015  . Pneumococcal-Unspecified 10/31/2012  . Tdap 08/13/2014  . Zoster 03/02/2010  . Zoster Recombinat (Shingrix) 07/03/2016, 08/30/2016    Hypertension This is a chronic problem. The problem is controlled. Pertinent negatives include no chest pain, headaches, palpitations or shortness of breath. Past treatments include ACE inhibitors and calcium channel blockers. The current treatment provides significant improvement. There are no compliance problems.  Identifiable causes of hypertension include a thyroid problem.  Thyroid Problem Presents for follow-up visit. Symptoms include constipation and tremors (very mild of head and sometimes hands). Patient reports no anxiety, depressed mood, diarrhea, fatigue, palpitations or weight loss. The symptoms have been stable (on methimazole followed by Endo).    Lab Results  Component Value Date   CREATININE 1.06 (H) 05/26/2019   BUN 16 05/26/2019   NA 143 05/26/2019   K 4.3 05/26/2019   CL 105 05/26/2019   CO2 24 05/26/2019   Lab Results  Component Value Date    CHOL 311 (H) 01/12/2018   HDL 100 01/12/2018   LDLCALC 181 (H) 01/12/2018   TRIG 149 01/12/2018   CHOLHDL 3.1 01/12/2018   Lab Results  Component Value Date   TSH 0.126 (L) 07/22/2012   No results found for: HGBA1C Lab Results  Component Value Date   WBC 7.2 01/18/2019   HGB 14.9 01/18/2019   HCT 43.8 01/18/2019   MCV 87 01/18/2019   PLT 260 01/18/2019   Lab Results  Component Value Date   ALT 15 01/18/2019   AST 22 01/18/2019   ALKPHOS 71 01/18/2019   BILITOT 0.3 01/18/2019     Review of Systems  Constitutional: Negative for chills, fatigue, fever and weight loss.  HENT: Negative for congestion, hearing loss, tinnitus, trouble swallowing and voice change.   Eyes: Negative for visual disturbance.  Respiratory: Negative for cough, chest tightness, shortness of breath and wheezing.   Cardiovascular: Negative for chest pain, palpitations and leg swelling.  Gastrointestinal: Positive for constipation. Negative for abdominal pain, diarrhea and vomiting.  Endocrine: Negative for polydipsia and polyuria.  Genitourinary: Negative for dysuria, frequency, genital sores, vaginal bleeding and vaginal discharge.  Musculoskeletal: Positive for myalgias (hip pain and uses cane to walk). Negative for arthralgias (right hip pain), gait problem and joint swelling.  Skin: Negative for color change and rash.  Neurological: Positive for tremors (very mild of head and sometimes hands). Negative for dizziness, light-headedness, numbness and headaches.  Hematological: Negative for adenopathy. Does not bruise/bleed easily.  Psychiatric/Behavioral: Negative for dysphoric mood and sleep disturbance. The patient is not nervous/anxious.     Patient Active Problem List   Diagnosis Date Noted  . Slow transit constipation 01/23/2020  . Encounter for long-term (current)  use of NSAIDs 05/26/2019  . Benign essential tremor 03/10/2018  . Insomnia 07/12/2017  . Trochanteric bursitis of right hip  01/12/2017  . Pseudophakia of right eye 12/12/2015  . History of parathyroidectomy 07/10/2015  . Hyperthyroidism, subclinical 04/22/2015  . Hyperlipidemia, mixed 08/13/2014  . Vitamin D deficiency 08/13/2014  . Ventricular shunt in place 08/13/2014  . Esophageal reflux 08/13/2014  . Normal pressure hydrocephalus ([place]) 11/05/2012  . Abnormal gait 08/26/2012  . Goiter, nontoxic, multinodular 05/23/2012  . History of acoustic neuroma 01/20/2010  . Colon polyp 01/20/2010  . Dependent edema 01/20/2010  . Essential (primary) hypertension 01/20/2010  . Arthritis of knee, left 01/20/2010  . Osteopenia 01/20/2010    Allergies  Allergen Reactions  . Tylenol [Acetaminophen] Other (See Comments)    Elevated LFTs    Past Surgical History:  Procedure Laterality Date  . ABDOMINAL HYSTERECTOMY    . ACOUSTIC NEUROMA RESECTION Right 1985  . APPENDECTOMY    . BRAIN SURGERY  09/08/2012   burr hole with biopsy; cranial tongs caliper/stereotactic frame right (Dr. Ralene Cork)  . BREAST EXCISIONAL BIOPSY Left    benign  . BREAST EXCISIONAL BIOPSY Left    benign  . BREAST SURGERY Left    lumpectomy  . CATARACT EXTRACTION Right   . COLON SURGERY    . EXCISION/RELEASE BURSA HIP Right 10/29/2017   Procedure: EXCISION/RELEASE BURSA HIP- IT BAND;  Surgeon: Leim Fabry, MD;  Location: [facility] ORS;  Service: Orthopedics;  Laterality: Right;  . EYE SURGERY Right    cataract  . FRACTURE SURGERY Right    shoulder  . PARATHYROIDECTOMY  2017   partial for hyperparathyroidism  . SHOULDER ARTHROSCOPY W/ ACROMIAL REPAIR Right 54yrs ago  . TUBAL LIGATION    . VENTRICULOPERITONEAL SHUNT Right 09/08/2012   for NPH    Social History   Tobacco Use  . Smoking status: Never Smoker  . Smokeless tobacco: Never Used  Vaping Use  . Vaping Use: Never used  Substance Use Topics  . Alcohol use: Yes    Alcohol/week: 3.0 standard drinks    Types: 2 Glasses of wine, 1 Standard drinks or equivalent per  week    Comment: Occasionally  . Drug use: Never     Medication list has been reviewed and updated.  Current Meds  Medication Sig  . amLODipine (NORVASC) 10 MG tablet TAKE 1 TABLET BY MOUTH EVERY DAY  . carboxymethylcellul-glycerin (OPTIVE) 0.5-0.9 % ophthalmic solution Place 1 drop into both eyes 2 (two) times daily.   . cholecalciferol (VITAMIN D3) 25 MCG (1000 UNIT) tablet Take 1,000 Units by mouth daily.  . diphenhydrAMINE (BENADRYL) 25 MG tablet Take 25 mg by mouth at bedtime.  . furosemide (LASIX) 20 MG tablet TAKE 1 TABLET BY MOUTH EVERY DAY  . lisinopril (ZESTRIL) 10 MG tablet TAKE 1 TABLET BY MOUTH EVERY DAY  . methimazole (TAPAZOLE) 5 MG tablet TAKE 1 TABLET (5 MG TOTAL) BY MOUTH DAILY.  . Multiple Vitamin (MULTIVITAMIN WITH MINERALS) TABS tablet Take 1 tablet by mouth daily. Centrum Silver for Women 50+  . naproxen sodium (ALEVE) 220 MG tablet Take 2 tablets by mouth daily.   . sennosides-docusate sodium (SENOKOT-S) 8.6-50 MG tablet Take 2 tablets by mouth 2 (two) times daily.     PHQ 2/9 Scores 01/23/2020 05/26/2019 05/10/2019 01/18/2019  PHQ - 2 Score 0 0 0 0  PHQ- 9 Score 0 0 - -    GAD 7 : Generalized Anxiety Score 01/23/2020  Nervous, Anxious, on Edge  0  Control/stop worrying 0  Worry too much - different things 0  Trouble relaxing 0  Restless 0  Easily annoyed or irritable 0  Afraid - awful might happen 0  Total GAD 7 Score 0    BP Readings from Last 3 Encounters:  01/23/20 132/90  05/26/19 132/82  05/10/19 120/80    Physical Exam Vitals and nursing note reviewed.  Constitutional:      General: She is not in acute distress.    Appearance: She is well-developed.  HENT:     Head: Normocephalic and atraumatic.     Right Ear: Tympanic membrane and ear canal normal.     Left Ear: Tympanic membrane and ear canal normal.     Nose:     Right Sinus: No maxillary sinus tenderness.     Left Sinus: No maxillary sinus tenderness.  Eyes:     General: No  scleral icterus.       Right eye: No discharge.        Left eye: No discharge.     Conjunctiva/sclera: Conjunctivae normal.  Neck:     Thyroid: No thyromegaly.     Vascular: No carotid bruit.  Cardiovascular:     Rate and Rhythm: Normal rate and regular rhythm.     Pulses: Normal pulses.     Heart sounds: Normal heart sounds.  Pulmonary:     Effort: Pulmonary effort is normal. No respiratory distress.     Breath sounds: No wheezing.  Chest:     Breasts:        Right: No mass, nipple discharge, skin change or tenderness.        Left: No mass, nipple discharge, skin change or tenderness.  Abdominal:     General: Bowel sounds are normal.     Palpations: Abdomen is soft.     Tenderness: There is no abdominal tenderness.  Musculoskeletal:     Cervical back: Normal range of motion. No erythema.     Right lower leg: No edema.     Left lower leg: No edema.  Lymphadenopathy:     Cervical: No cervical adenopathy.  Skin:    General: Skin is warm and dry.     Capillary Refill: Capillary refill takes less than 2 seconds.     Findings: No rash.  Neurological:     Mental Status: She is alert and oriented to person, place, and time.     Cranial Nerves: No cranial nerve deficit.     Sensory: No sensory deficit.     Motor: Tremor (fine tremor of hands and head) present.     Coordination: Coordination is intact.     Deep Tendon Reflexes: Reflexes are normal and symmetric.  Psychiatric:        Attention and Perception: Attention normal.        Mood and Affect: Mood normal.     Wt Readings from Last 3 Encounters:  01/23/20 131 lb (59.4 kg)  05/26/19 133 lb (60.3 kg)  05/10/19 133 lb (60.3 kg)    BP 132/90   Pulse 86   Temp 97.6 F (36.4 C) (Oral)   Ht 5\' 1"  (1.549 m)   Wt 131 lb (59.4 kg)   SpO2 95%   BMI 24.75 kg/m   Assessment and Plan: 1. Essential (primary) hypertension Clinically stable exam with well controlled BP on lisinopril, amlodipine and furosemide. Tolerating  medications without side effects at this time. Pt to continue current regimen and low sodium diet; benefits of  regular exercise as able discussed. - CBC with Differential/Platelet - Comprehensive metabolic panel - POCT urinalysis dipstick  2. Hyperthyroidism, subclinical On methimazole; followed by Endocrinology Recent labs normal  3. Slow transit constipation Continue Colace with mild laxative as needed  4. Encounter for screening mammogram for breast cancer Schedule at [facility]; Future  5. Mixed hyperlipidemia Not currently on statin medication 2/2 age - Lipid panel  6. Dependent edema Mild, controlled by furosemide  7. S/P parathyroidectomy [facility]) Being monitored regularly by Endocrinology  8. Benign essential tremor Mild, unchanged and does not require intervention at this time.   Partially dictated using Editor, commissioning. Any errors are unintentional.  Halina Maidens, MD [facility]  01/23/2020

## 2020-01-24 LAB — CBC WITH DIFFERENTIAL/PLATELET
Basophils Absolute: 0.1 10*3/uL (ref 0.0–0.2)
Basos: 1 %
EOS (ABSOLUTE): 0.1 10*3/uL (ref 0.0–0.4)
Eos: 1 %
Hematocrit: 46.3 % (ref 34.0–46.6)
Hemoglobin: 14.8 g/dL (ref 11.1–15.9)
Immature Grans (Abs): 0 10*3/uL (ref 0.0–0.1)
Immature Granulocytes: 0 %
Lymphocytes Absolute: 1.9 10*3/uL (ref 0.7–3.1)
Lymphs: 34 %
MCH: 28.1 pg (ref 26.6–33.0)
MCHC: 32 g/dL (ref 31.5–35.7)
MCV: 88 fL (ref 79–97)
Monocytes Absolute: 0.4 10*3/uL (ref 0.1–0.9)
Monocytes: 8 %
Neutrophils Absolute: 3 10*3/uL (ref 1.4–7.0)
Neutrophils: 56 %
Platelets: 282 10*3/uL (ref 150–450)
RBC: 5.27 x10E6/uL (ref 3.77–5.28)
RDW: 12.7 % (ref 11.7–15.4)
WBC: 5.4 10*3/uL (ref 3.4–10.8)

## 2020-01-24 LAB — COMPREHENSIVE METABOLIC PANEL
ALT: 15 IU/L (ref 0–32)
AST: 23 IU/L (ref 0–40)
Albumin/Globulin Ratio: 1.9 (ref 1.2–2.2)
Albumin: 4.8 g/dL — ABNORMAL HIGH (ref 3.6–4.6)
Alkaline Phosphatase: 72 IU/L (ref 44–121)
BUN/Creatinine Ratio: 19 (ref 12–28)
BUN: 19 mg/dL (ref 8–27)
Bilirubin Total: 0.3 mg/dL (ref 0.0–1.2)
CO2: 23 mmol/L (ref 20–29)
Calcium: 10.1 mg/dL (ref 8.7–10.3)
Chloride: 105 mmol/L (ref 96–106)
Creatinine, Ser: 1 mg/dL (ref 0.57–1.00)
GFR calc Af Amer: 60 mL/min/{1.73_m2} (ref >59)
GFR calc non Af Amer: 52 mL/min/{1.73_m2} — ABNORMAL LOW (ref >59)
Globulin, Total: 2.5 g/dL (ref 1.5–4.5)
Glucose: 95 mg/dL (ref 65–99)
Potassium: 3.9 mmol/L (ref 3.5–5.2)
Sodium: 144 mmol/L (ref 134–144)
Total Protein: 7.3 g/dL (ref 6.0–8.5)

## 2020-01-24 LAB — LIPID PANEL
Chol/HDL Ratio: 3.3 ratio (ref 0.0–4.4)
Cholesterol, Total: 306 mg/dL — ABNORMAL HIGH (ref 100–199)
HDL: 92 mg/dL (ref >39)
LDL Chol Calc (NIH): 197 mg/dL — ABNORMAL HIGH (ref 0–99)
Triglycerides: 102 mg/dL (ref 0–149)
VLDL Cholesterol Cal: 17 mg/dL (ref 5–40)

## 2020-01-30 DIAGNOSIS — M5441 Lumbago with sciatica, right side: Secondary | ICD-10-CM | POA: Diagnosis not present

## 2020-01-30 DIAGNOSIS — M9901 Segmental and somatic dysfunction of cervical region: Secondary | ICD-10-CM | POA: Diagnosis not present

## 2020-01-30 DIAGNOSIS — M531 Cervicobrachial syndrome: Secondary | ICD-10-CM | POA: Diagnosis not present

## 2020-01-30 DIAGNOSIS — M9903 Segmental and somatic dysfunction of lumbar region: Secondary | ICD-10-CM | POA: Diagnosis not present

## 2020-01-30 DIAGNOSIS — M9905 Segmental and somatic dysfunction of pelvic region: Secondary | ICD-10-CM | POA: Diagnosis not present

## 2020-02-01 ENCOUNTER — Other Ambulatory Visit: Payer: Self-pay | Admitting: Internal Medicine

## 2020-02-05 ENCOUNTER — Other Ambulatory Visit: Payer: Self-pay | Admitting: Internal Medicine

## 2020-02-07 ENCOUNTER — Other Ambulatory Visit: Payer: Self-pay

## 2020-02-07 ENCOUNTER — Ambulatory Visit
Admission: RE | Admit: 2020-02-07 | Discharge: 2020-02-07 | Disposition: A | Discharge status: Home / Self Care | Payer: Medicare Other | Source: Ambulatory Visit | Attending: Internal Medicine | Admitting: Internal Medicine

## 2020-02-07 DIAGNOSIS — Z1231 Encounter for screening mammogram for malignant neoplasm of breast: Secondary | ICD-10-CM | POA: Diagnosis not present

## 2020-02-15 ENCOUNTER — Other Ambulatory Visit: Payer: Self-pay | Admitting: Internal Medicine

## 2020-03-06 DIAGNOSIS — M5441 Lumbago with sciatica, right side: Secondary | ICD-10-CM | POA: Diagnosis not present

## 2020-03-06 DIAGNOSIS — M9903 Segmental and somatic dysfunction of lumbar region: Secondary | ICD-10-CM | POA: Diagnosis not present

## 2020-03-06 DIAGNOSIS — M9905 Segmental and somatic dysfunction of pelvic region: Secondary | ICD-10-CM | POA: Diagnosis not present

## 2020-03-06 DIAGNOSIS — M9901 Segmental and somatic dysfunction of cervical region: Secondary | ICD-10-CM | POA: Diagnosis not present

## 2020-03-06 DIAGNOSIS — M531 Cervicobrachial syndrome: Secondary | ICD-10-CM | POA: Diagnosis not present

## 2020-03-10 IMAGING — MG DIGITAL SCREENING BILAT W/ TOMO W/ CAD
8 series · 8 of 24 positions shown · non-contrast
Comparison: Previous exam(s).

CLINICAL DATA: Screening. History of benign left breast excision.

EXAM:
DIGITAL SCREENING BILATERAL MAMMOGRAM WITH TOMO AND CAD

[R CC synth-2D]
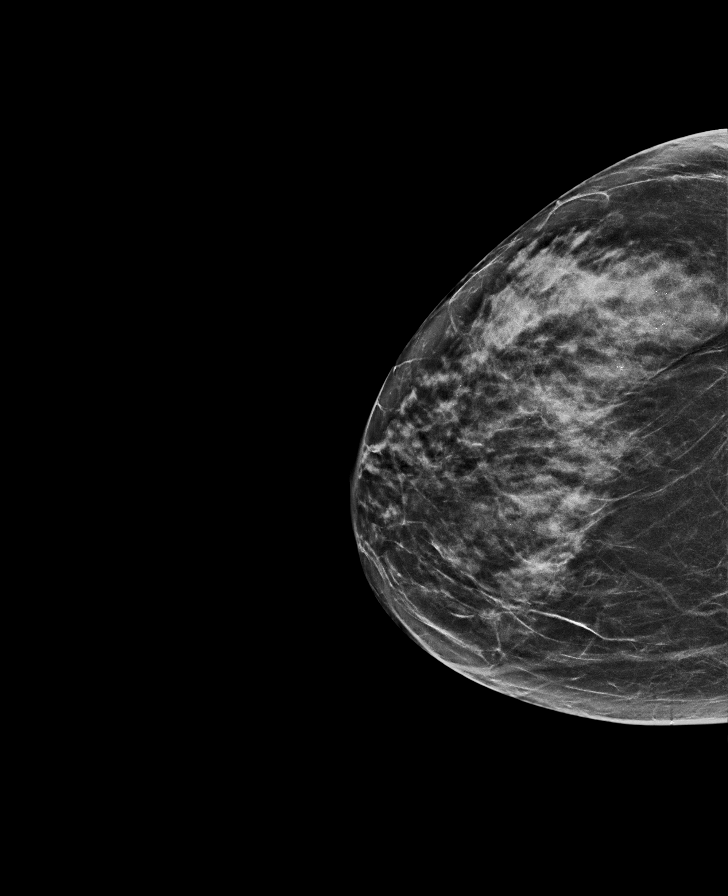

[L MLO synth-2D]
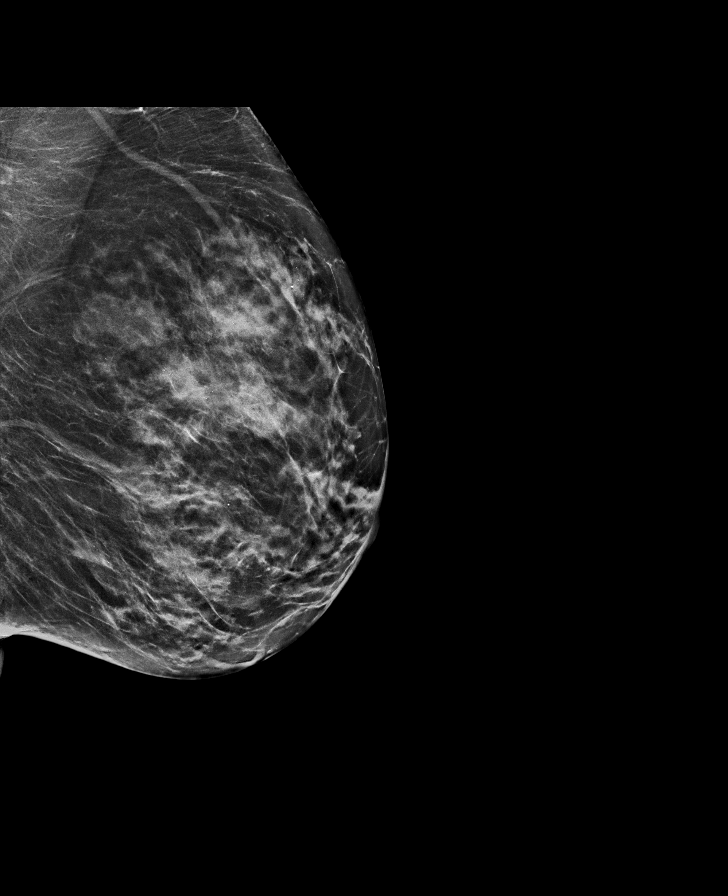

[R MLO synth-2D]
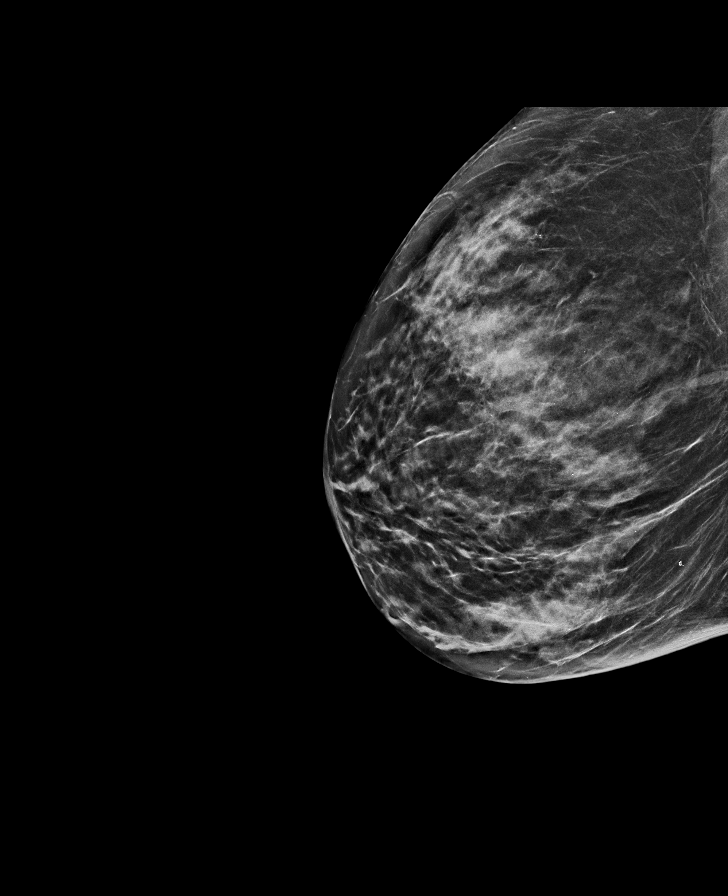

[L CC synth-2D]
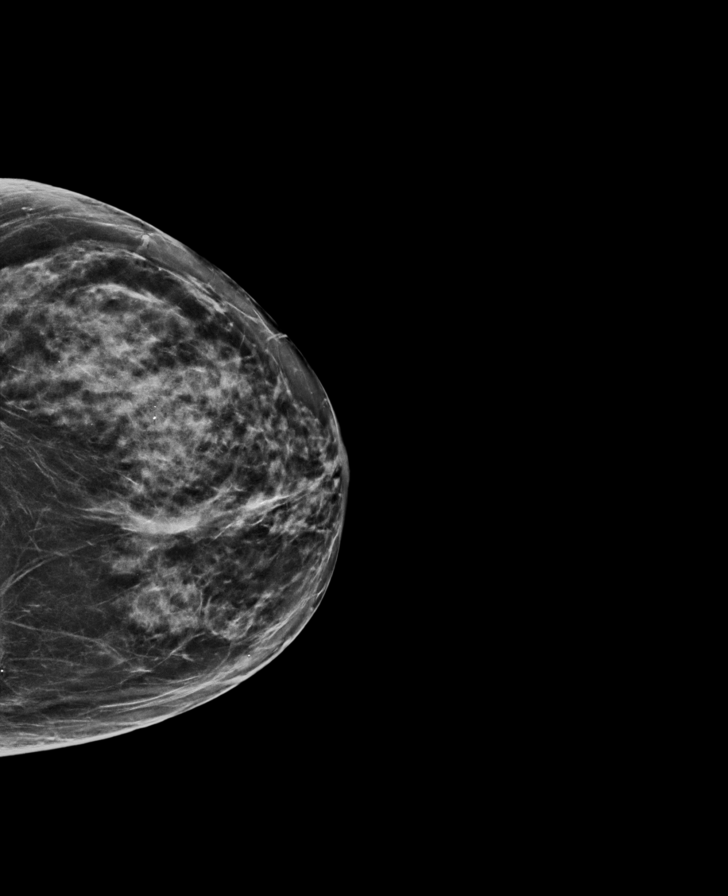

[L CC tomo · tomo slice 33/65.0]
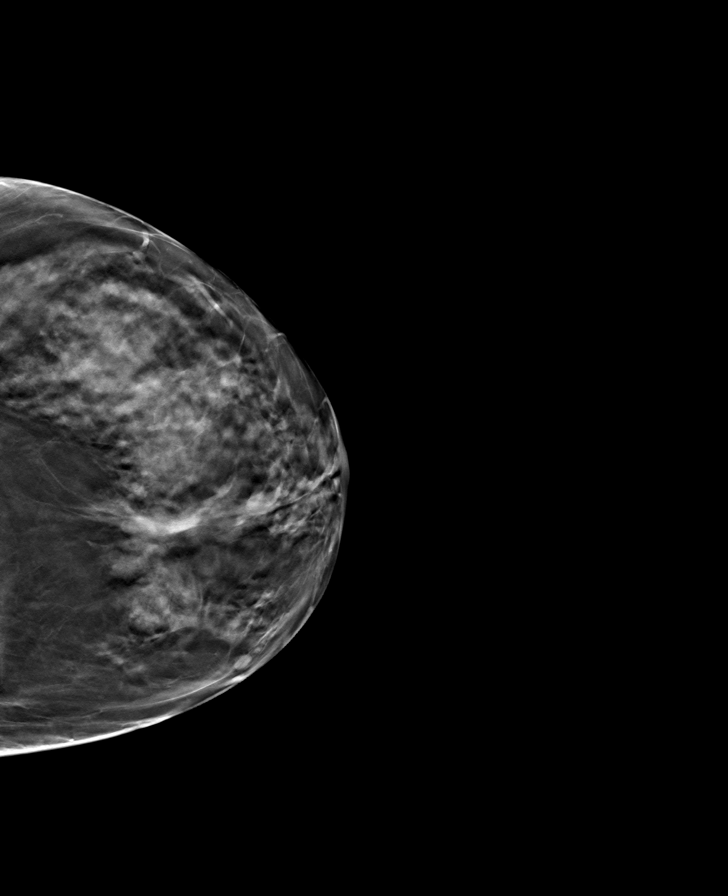

[L MLO tomo · tomo slice 32/63.0]
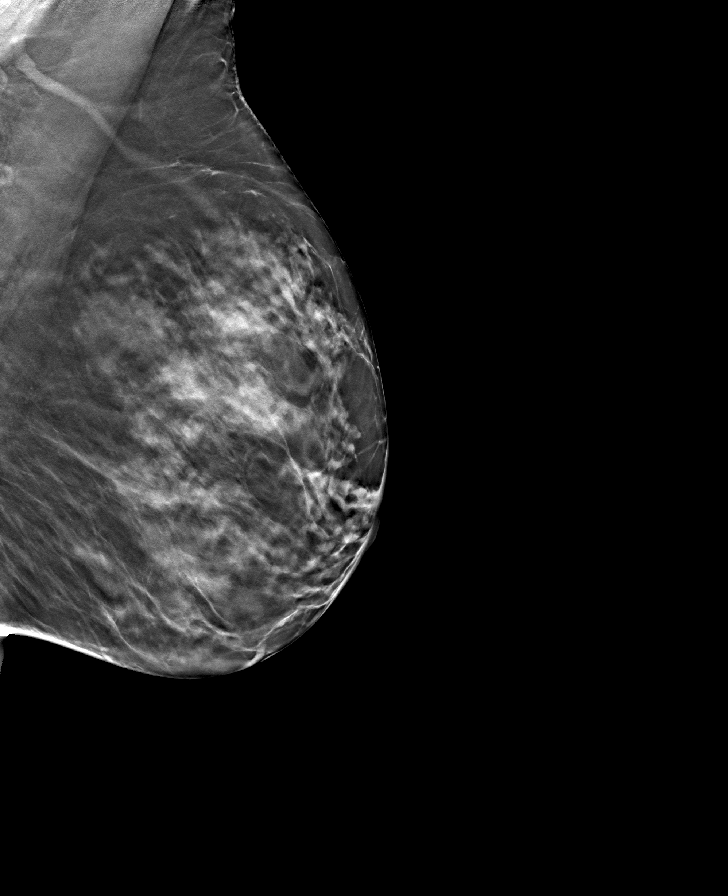

[R MLO tomo · tomo slice 31/62.0]
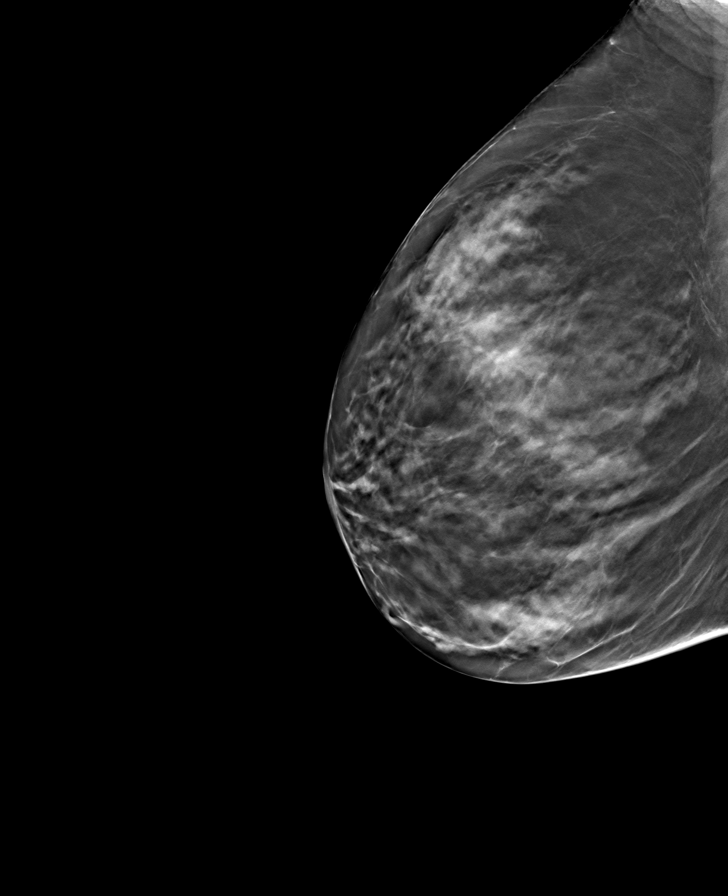

[R CC tomo · tomo slice 33/65.0]
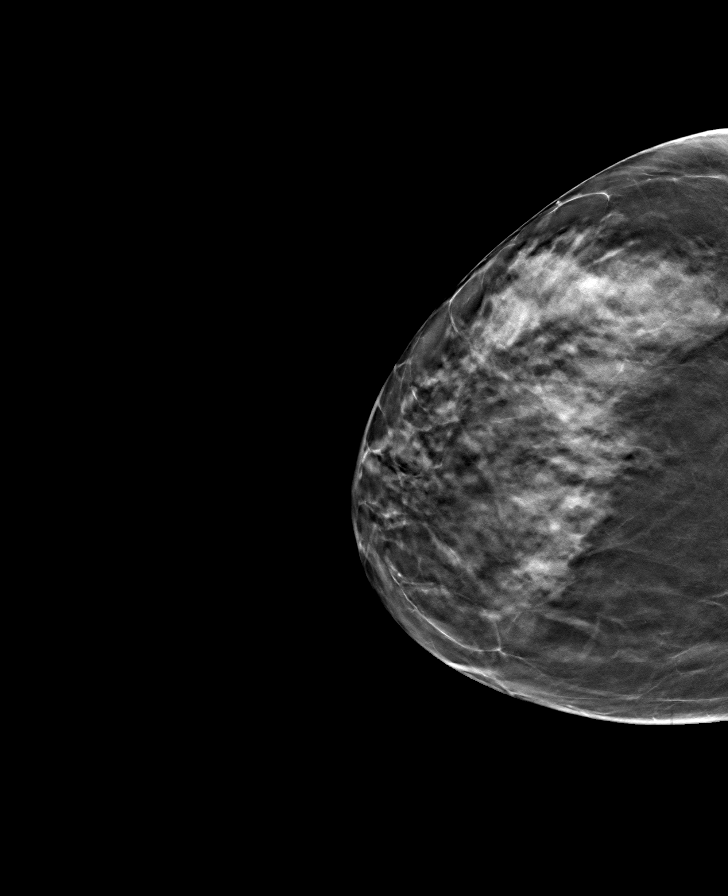

[8 of 24 positions shown; findings below may reference images not displayed]

ACR Breast Density Category c: The breast tissue is heterogeneously
dense, which may obscure small masses.
FINDINGS: There are no findings suspicious for malignancy. Images were
processed with CAD.
IMPRESSION: No mammographic evidence of malignancy. A result letter of this
screening mammogram will be mailed directly to the patient.

RECOMMENDATION:
Screening mammogram in one year. (Code:AH-E-9EK)

BI-RADS CATEGORY  1: Negative.

## 2020-04-03 DIAGNOSIS — E042 Nontoxic multinodular goiter: Secondary | ICD-10-CM | POA: Diagnosis not present

## 2020-04-03 DIAGNOSIS — E210 Primary hyperparathyroidism: Secondary | ICD-10-CM | POA: Diagnosis not present

## 2020-04-03 DIAGNOSIS — E0590 Thyrotoxicosis, unspecified without thyrotoxic crisis or storm: Secondary | ICD-10-CM | POA: Diagnosis not present

## 2020-04-03 LAB — CBC AND DIFFERENTIAL
HCT: 47 — AB (ref 36–46)
Hemoglobin: 14.9 (ref 12.0–16.0)
Platelets: 290 (ref 150–399)
WBC: 6

## 2020-04-03 LAB — TSH: TSH: 0.64 (ref 0.41–5.90)

## 2020-04-09 DIAGNOSIS — M955 Acquired deformity of pelvis: Secondary | ICD-10-CM | POA: Diagnosis not present

## 2020-04-09 DIAGNOSIS — M9902 Segmental and somatic dysfunction of thoracic region: Secondary | ICD-10-CM | POA: Diagnosis not present

## 2020-04-09 DIAGNOSIS — M9903 Segmental and somatic dysfunction of lumbar region: Secondary | ICD-10-CM | POA: Diagnosis not present

## 2020-04-09 DIAGNOSIS — M546 Pain in thoracic spine: Secondary | ICD-10-CM | POA: Diagnosis not present

## 2020-04-09 DIAGNOSIS — M5442 Lumbago with sciatica, left side: Secondary | ICD-10-CM | POA: Diagnosis not present

## 2020-04-09 DIAGNOSIS — M9905 Segmental and somatic dysfunction of pelvic region: Secondary | ICD-10-CM | POA: Diagnosis not present

## 2020-05-13 ENCOUNTER — Ambulatory Visit (INDEPENDENT_AMBULATORY_CARE_PROVIDER_SITE_OTHER): Payer: Medicare Other

## 2020-05-13 ENCOUNTER — Other Ambulatory Visit: Payer: Self-pay

## 2020-05-13 VITALS — BP 120/80 | HR 88 | Temp 97.8°F | Resp 16 | Ht 61.0 in | Wt 133.8 lb

## 2020-05-13 DIAGNOSIS — Z0000 Encounter for general adult medical examination without abnormal findings: Secondary | ICD-10-CM | POA: Diagnosis not present

## 2020-05-13 NOTE — Patient Instructions (Signed)
Danielle Herrera , Thank you for taking time to come for your Medicare Wellness Visit. I appreciate your ongoing commitment to your health goals. Please review the following plan we discussed and let me know if I can assist you in the future.   Screening recommendations/referrals: Colonoscopy: no longer required Mammogram: done 02/07/20 Bone Density: done 07/22/16 Recommended yearly ophthalmology/optometry visit for glaucoma screening and checkup Recommended yearly dental visit for hygiene and checkup  Vaccinations: Influenza vaccine: done 11/07/19 Pneumococcal vaccine: done 2018 Tdap vaccine: done 08/13/14 Shingles vaccine: done 07/03/16 & 08/30/16   Covid-19:done 03/15/19, 04/06/19 & 11/27/19  Conditions/risks identified: Keep up the great work!  Next appointment: Follow up in one year for your annual wellness visit    Preventive Care 65 Years and Older, Female Preventive care refers to lifestyle choices and visits with your health care provider that can promote health and wellness. What does preventive care include?  A yearly physical exam. This is also called an annual well check.  Dental exams once or twice a year.  Routine eye exams. Ask your health care provider how often you should have your eyes checked.  Personal lifestyle choices, including:  Daily care of your teeth and gums.  Regular physical activity.  Eating a healthy diet.  Avoiding tobacco and drug use.  Limiting alcohol use.  Practicing safe sex.  Taking low-dose aspirin every day.  Taking vitamin and mineral supplements as recommended by your health care provider. What happens during an annual well check? The services and screenings done by your health care provider during your annual well check will depend on your age, overall health, lifestyle risk factors, and family history of disease. Counseling  Your health care provider may ask you questions about your:  Alcohol use.  Tobacco use.  Drug  use.  Emotional well-being.  Home and relationship well-being.  Sexual activity.  Eating habits.  History of falls.  Memory and ability to understand (cognition).  Work and work Statistician.  Reproductive health. Screening  You may have the following tests or measurements:  Height, weight, and BMI.  Blood pressure.  Lipid and cholesterol levels. These may be checked every 5 years, or more frequently if you are over 34 years old.  Skin check.  Lung cancer screening. You may have this screening every year starting at age 71 if you have a 30-pack-year history of smoking and currently smoke or have quit within the past 15 years.  Fecal occult blood test (FOBT) of the stool. You may have this test every year starting at age 36.  Flexible sigmoidoscopy or colonoscopy. You may have a sigmoidoscopy every 5 years or a colonoscopy every 10 years starting at age 59.  Hepatitis C blood test.  Hepatitis B blood test.  Sexually transmitted disease (STD) testing.  Diabetes screening. This is done by checking your blood sugar (glucose) after you have not eaten for a while (fasting). You may have this done every 1-3 years.  Bone density scan. This is done to screen for osteoporosis. You may have this done starting at age 44.  Mammogram. This may be done every 1-2 years. Talk to your health care provider about how often you should have regular mammograms. Talk with your health care provider about your test results, treatment options, and if necessary, the need for more tests. Vaccines  Your health care provider may recommend certain vaccines, such as:  Influenza vaccine. This is recommended every year.  Tetanus, diphtheria, and acellular pertussis (Tdap, Td) vaccine. You  may need a Td booster every 10 years.  Zoster vaccine. You may need this after age 2.  Pneumococcal 13-valent conjugate (PCV13) vaccine. One dose is recommended after age 53.  Pneumococcal polysaccharide  (PPSV23) vaccine. One dose is recommended after age 48. Talk to your health care provider about which screenings and vaccines you need and how often you need them. This information is not intended to replace advice given to you by your health care provider. Make sure you discuss any questions you have with your health care provider. Document Released: 03/15/2015 Document Revised: 11/06/2015 Document Reviewed: 12/18/2014 Elsevier Interactive Patient Education  2017 [place] Prevention in the Home Falls can cause injuries. They can happen to people of all ages. There are many things you can do to make your home safe and to help prevent falls. What can I do on the outside of my home?  Regularly fix the edges of walkways and driveways and fix any cracks.  Remove anything that might make you trip as you walk through a door, such as a raised step or threshold.  Trim any bushes or trees on the path to your home.  Use bright outdoor lighting.  Clear any walking paths of anything that might make someone trip, such as rocks or tools.  Regularly check to see if handrails are loose or broken. Make sure that both sides of any steps have handrails.  Any raised decks and porches should have guardrails on the edges.  Have any leaves, snow, or ice cleared regularly.  Use sand or salt on walking paths during winter.  Clean up any spills in your garage right away. This includes oil or grease spills. What can I do in the bathroom?  Use night lights.  Install grab bars by the toilet and in the tub and shower. Do not use towel bars as grab bars.  Use non-skid mats or decals in the tub or shower.  If you need to sit down in the shower, use a plastic, non-slip stool.  Keep the floor dry. Clean up any water that spills on the floor as soon as it happens.  Remove soap buildup in the tub or shower regularly.  Attach bath mats securely with double-sided non-slip rug tape.  Do not have  throw rugs and other things on the floor that can make you trip. What can I do in the bedroom?  Use night lights.  Make sure that you have a light by your bed that is easy to reach.  Do not use any sheets or blankets that are too big for your bed. They should not hang down onto the floor.  Have a firm chair that has side arms. You can use this for support while you get dressed.  Do not have throw rugs and other things on the floor that can make you trip. What can I do in the kitchen?  Clean up any spills right away.  Avoid walking on wet floors.  Keep items that you use a lot in easy-to-reach places.  If you need to reach something above you, use a strong step stool that has a grab bar.  Keep electrical cords out of the way.  Do not use floor polish or wax that makes floors slippery. If you must use wax, use non-skid floor wax.  Do not have throw rugs and other things on the floor that can make you trip. What can I do with my stairs?  Do not leave any items  on the stairs.  Make sure that there are handrails on both sides of the stairs and use them. Fix handrails that are broken or loose. Make sure that handrails are as long as the stairways.  Check any carpeting to make sure that it is firmly attached to the stairs. Fix any carpet that is loose or worn.  Avoid having throw rugs at the top or bottom of the stairs. If you do have throw rugs, attach them to the floor with carpet tape.  Make sure that you have a light switch at the top of the stairs and the bottom of the stairs. If you do not have them, ask someone to add them for you. What else can I do to help prevent falls?  Wear shoes that:  Do not have high heels.  Have rubber bottoms.  Are comfortable and fit you well.  Are closed at the toe. Do not wear sandals.  If you use a stepladder:  Make sure that it is fully opened. Do not climb a closed stepladder.  Make sure that both sides of the stepladder are  locked into place.  Ask someone to hold it for you, if possible.  Clearly mark and make sure that you can see:  Any grab bars or handrails.  First and last steps.  Where the edge of each step is.  Use tools that help you move around (mobility aids) if they are needed. These include:  Canes.  Walkers.  Scooters.  Crutches.  Turn on the lights when you go into a dark area. Replace any light bulbs as soon as they burn out.  Set up your furniture so you have a clear path. Avoid moving your furniture around.  If any of your floors are uneven, fix them.  If there are any pets around you, be aware of where they are.  Review your medicines with your doctor. Some medicines can make you feel dizzy. This can increase your chance of falling. Ask your doctor what other things that you can do to help prevent falls. This information is not intended to replace advice given to you by your health care provider. Make sure you discuss any questions you have with your health care provider. Document Released: 12/13/2008 Document Revised: 07/25/2015 Document Reviewed: 03/23/2014 Elsevier Interactive Patient Education  2017 [employer].

## 2020-05-13 NOTE — Progress Notes (Signed)
Subjective:   [place] Audreyanna Butkiewicz is a 87 y.o. female who presents for Medicare Annual (Subsequent) preventive examination.  Review of Systems     Cardiac Risk Factors include: advanced age (>50men, >58 women);hypertension     Objective:    Today's Vitals   05/13/20 1005  BP: 120/80  Pulse: 88  Resp: 16  Temp: 97.8 F (36.6 C)  TempSrc: Oral  SpO2: 99%  Weight: 133 lb 12.8 oz (60.7 kg)  Height: 5\' 1"  (1.549 m)   Body mass index is 25.28 kg/m.  Advanced Directives 05/13/2020 05/10/2019 05/04/2018 11/01/2017 11/01/2017 10/25/2017 07/20/2016  Does Patient Have a Medical Advance Directive? Yes Yes Yes Yes Yes Yes Yes  Type of Paramedic of [place];Living will [place];Living will Living will;Healthcare Power of [place];Living will [place];Living will  Does patient want to make changes to medical advance directive? - - - - - No - Patient declined -  Copy of [place] in Chart? Yes - validated most recent copy scanned in chart (See row information) Yes - validated most recent copy scanned in chart (See row information) Yes - validated most recent copy scanned in chart (See row information) - - Yes No - copy requested    Current Medications (verified) Outpatient Encounter Medications as of 05/13/2020  Medication Sig  . amLODipine (NORVASC) 10 MG tablet TAKE 1 TABLET BY MOUTH EVERY DAY  . carboxymethylcellul-glycerin (OPTIVE) 0.5-0.9 % ophthalmic solution Place 1 drop into both eyes 2 (two) times daily.   . cholecalciferol (VITAMIN D3) 25 MCG (1000 UNIT) tablet Take 1,000 Units by mouth daily.  . diphenhydrAMINE (BENADRYL) 25 MG tablet Take 25 mg by mouth at bedtime.  . furosemide (LASIX) 20 MG tablet TAKE 1 TABLET BY MOUTH EVERY DAY  . lisinopril (ZESTRIL) 10 MG tablet TAKE 1 TABLET BY MOUTH EVERY DAY  . methimazole (TAPAZOLE) 5 MG tablet TAKE 1 TABLET (5 MG TOTAL) BY MOUTH  DAILY.  . Multiple Vitamin (MULTIVITAMIN WITH MINERALS) TABS tablet Take 1 tablet by mouth daily. Centrum Silver for Women 50+  . naproxen sodium (ALEVE) 220 MG tablet Take 2 tablets by mouth daily.   . sennosides-docusate sodium (SENOKOT-S) 8.6-50 MG tablet Take 2 tablets by mouth 2 (two) times daily.    No facility-administered encounter medications on file as of 05/13/2020.    Allergies (verified) Tylenol [acetaminophen]   History: Past Medical History:  Diagnosis Date  . Acoustic neuroma ([place]) 01/20/2010   Formatting of this note might be different from the original. Hx of  . Allergy   . Arthritis of knee, left 01/20/2010  . Bone spur 2019   right hip   . Cataract 12/06/2019   surgery   . Esophageal reflux 08/13/2014  . Fibrocystic breast disease   . Goiter, nontoxic, multinodular 05/23/2012  . H/O jaundice   . 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 Hepatitis    as a child from drinking bad water  . Hip pain, chronic, right   . HLD (hyperlipidemia) 08/13/2014  . Hyperparathyroidism, primary ([place]) 08/02/2013  . Hypertension   . Hyperthyroidism   . Multiple thyroid nodules   . Normal pressure hydrocephalus ([place]) 11/30/2013  . Osteopenia 01/20/2010  . Osteoporosis   . Pneumonia 1960's  . Thyroid disease   . Ventricular shunt in place 08/13/2014  . Vitamin D deficiency 08/13/2014   Past Surgical History:  Procedure Laterality Date  . ABDOMINAL HYSTERECTOMY    .  ACOUSTIC NEUROMA RESECTION Right 1985  . APPENDECTOMY    . BRAIN SURGERY  09/08/2012   burr hole with biopsy; cranial tongs caliper/stereotactic frame right (Dr. Ralene Cork)  . BREAST EXCISIONAL BIOPSY Left    benign  . BREAST EXCISIONAL BIOPSY Left    benign  . BREAST SURGERY Left    lumpectomy  . CATARACT EXTRACTION Bilateral    right eye several years ago; left eye fall 2021  . COLON SURGERY    . EXCISION/RELEASE BURSA HIP Right 10/29/2017   Procedure: EXCISION/RELEASE BURSA HIP- IT BAND;  Surgeon:  Leim Fabry, MD;  Location: [facility] ORS;  Service: Orthopedics;  Laterality: Right;  . EYE SURGERY Right    cataract  . FRACTURE SURGERY Right    shoulder  . PARATHYROIDECTOMY  2017   partial for hyperparathyroidism  . SHOULDER ARTHROSCOPY W/ ACROMIAL REPAIR Right 56yrs ago  . TUBAL LIGATION    . VENTRICULOPERITONEAL SHUNT Right 09/08/2012   for NPH   Family History  Problem Relation Age of Onset  . Hypertension Mother   . Mental illness Mother   . Dementia Mother   . Heart disease Father   . Hypertension Father   . Diabetes Sister   . Stroke Brother   . Heart disease Brother   . Breast cancer Paternal Aunt    Social History   Socioeconomic History  . Marital status: Widowed    Spouse name: Not on file  . Number of children: 0  . Years of education: Not on file  . Highest education level: 12th grade  Occupational History  . Occupation: retired  Tobacco Use  . Smoking status: Never Smoker  . Smokeless tobacco: Never Used  Vaping Use  . Vaping Use: Never used  Substance and Sexual Activity  . Alcohol use: Yes    Alcohol/week: 3.0 standard drinks    Types: 2 Glasses of wine, 1 Standard drinks or equivalent per week    Comment: Occasionally  . Drug use: Never  . Sexual activity: Not Currently  Other Topics Concern  . Not on file  Social History Narrative   Pt lives alone   Social Determinants of Health   Financial Resource Strain: Low Risk   . Difficulty of Paying Living Expenses: Not hard at all  Food Insecurity: No Food Insecurity  . Worried About Charity fundraiser in the Last Year: Never true  . Ran Out of Food in the Last Year: Never true  Transportation Needs: No Transportation Needs  . Lack of Transportation (Medical): No  . Lack of Transportation (Non-Medical): No  Physical Activity: Insufficiently Active  . Days of Exercise per Week: 7 days  . Minutes of Exercise per Session: 20 min  Stress: No Stress Concern Present  . Feeling of Stress : Not at  all  Social Connections: Moderately Isolated  . Frequency of Communication with Friends and Family: More than three times a week  . Frequency of Social Gatherings with Friends and Family: Twice a week  . Attends Religious Services: More than 4 times per year  . Active Member of Clubs or Organizations: No  . Attends Archivist Meetings: Never  . Marital Status: Widowed    Tobacco Counseling Counseling given: Not Answered   Clinical Intake:  Pre-visit preparation completed: Yes  Pain : No/denies pain     BMI - recorded: 25.28 Nutritional Status: BMI 25 -29 Overweight Nutritional Risks: None Diabetes: No  How often do you need to have someone  help you when you read instructions, pamphlets, or other written materials from your doctor or pharmacy?: 1 - Never    Interpreter Needed?: No  Information entered by :: Clemetine Marker LPN   Activities of Daily Living In your present state of health, do you have any difficulty performing the following activities: 05/13/2020  Hearing? Y  Comment wears left hearing aid  Vision? N  Difficulty concentrating or making decisions? N  Walking or climbing stairs? N  Dressing or bathing? N  Doing errands, shopping? N  Preparing Food and eating ? N  Using the Toilet? N  In the past six months, have you accidently leaked urine? N  Do you have problems with loss of bowel control? N  Managing your Medications? N  Managing your Finances? N  Housekeeping or managing your Housekeeping? N  Some recent data might be hidden    Patient Care Team: Glean Hess, MD as PCP - General (Internal Medicine)  Indicate any recent Medical Services you may have received from other than [facility] providers in the past year (date may be approximate).     Assessment:   This is a routine wellness examination for Makaylee.  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 Increase water intake     Recommend drinking 3-4 glasses of water a day.      Depression Screen PHQ 2/9 Scores 05/13/2020 01/23/2020 05/26/2019 05/10/2019 01/18/2019 07/13/2018 05/04/2018  PHQ - 2 Score 0 0 0 0 0 0 0  PHQ- 9 Score - 0 0 - - - -    Fall Risk Fall Risk  05/13/2020 01/23/2020 05/26/2019 05/10/2019 01/18/2019  Falls in the past year? 0 0 0 0 1  Number falls in past yr: 0 - 0 0 1  Injury with Fall? 0 - 0 0 1  Comment - - - - -  Risk for fall due to : No Fall Risks - History of fall(s);Impaired balance/gait Impaired balance/gait;Orthopedic patient History of fall(s);Impaired balance/gait  Follow up Falls prevention discussed Falls evaluation completed Falls evaluation completed Falls prevention discussed Falls evaluation completed    FALL RISK PREVENTION PERTAINING TO THE HOME:  Any stairs in or around the home? No  If so, are there any without handrails? No  Home free of loose throw rugs in walkways, pet beds, electrical cords, etc? Yes  Adequate lighting in your home to reduce risk of falls? Yes   ASSISTIVE DEVICES UTILIZED TO PREVENT FALLS:  Life alert? No  Use of a cane, walker or w/c? No  Grab bars in the bathroom? Yes  Shower chair or bench in shower? Yes  Elevated toilet seat or a handicapped toilet? Yes   TIMED UP AND GO:  Was the test performed? Yes .  Length of time to ambulate 10 feet: 5 sec.   Gait steady and fast without use of assistive device  Cognitive Function: Normal cognitive status assessed by direct  observation by this Nurse Health Advisor. No abnormalities found.       6CIT Screen 05/10/2019 05/04/2018 07/20/2016  What Year? 0 points 0 points 0 points  What month? 0 points 0 points 0 points  What time? 0 points 0 points 0 points  Count back from 20 0 points 0 points 0 points  Months in reverse 0 points 0 points 0 points  Repeat phrase 0 points 0 points 2 points  Total Score 0 0 2    Immunizations Immunization History  Administered Date(s) Administered  . Fluad Quad(high Dose 65+) 11/08/2018  . Influenza, High Dose Seasonal PF 11/22/2017, 11/07/2019  . Influenza-Unspecified 11/26/2011, 12/11/2014, 11/30/2016, 11/21/2018  . PFIZER(Purple Top)SARS-COV-2 Vaccination 03/15/2019, 04/06/2019, 11/27/2019  . Pneumococcal Conjugate-13 04/24/2014, 03/02/2016  . Pneumococcal Polysaccharide-23 05/31/2010, 07/01/2012, 11/21/2015  . Pneumococcal-Unspecified 10/31/2012  . Tdap 08/13/2014  . Zoster 03/02/2010  . Zoster Recombinat (Shingrix) 07/03/2016, 08/30/2016    TDAP status: Up to date  Flu Vaccine status: Up to date  Pneumococcal vaccine status: Up to date  Covid-19 vaccine status: Completed vaccines  Qualifies for Shingles Vaccine? Yes   Zostavax completed Yes   Shingrix Completed?: Yes  Screening Tests Health Maintenance  Topic Date Due  . TETANUS/TDAP  08/12/2024  . INFLUENZA VACCINE  Completed  . DEXA SCAN  Completed  . COVID-19 Vaccine  Completed  . PNA vac Low Risk Adult  Completed  . HPV VACCINES  Aged Out    Health Maintenance  There are no preventive care reminders to display for this patient.  Colorectal cancer screening: No longer required.   Mammogram status: Completed 02/07/20. Repeat every year  Bone Density status: Completed 07/22/16. Results reflect: Bone density results: OSTEOPENIA. Repeat every 2 years. Pt states scheduled for dexa at [facility] in May 2022.   Lung Cancer Screening: (Low Dose CT Chest recommended if Age 50-80 years, 30 pack-year currently  smoking OR have quit w/in 15years.) does not qualify.   Additional Screening:  Hepatitis C Screening: does not qualify.  Vision Screening: Recommended annual ophthalmology exams for early detection of glaucoma and other disorders of the eye. Is the patient up to date with their annual eye exam?  Yes  Who is the provider or what is the name of the office in which the patient attends annual eye exams? Dr. Ellin Mayhew  Dental Screening: Recommended annual dental exams for proper oral hygiene  Community Resource Referral / Chronic Care Management: CRR required this visit?  No   CCM required this visit?  No      Plan:     I have personally reviewed and noted the following in the patient's chart:   . Medical and social history . Use of alcohol, tobacco or illicit drugs  . Current medications and supplements . Functional ability and status . Nutritional status . Physical activity . Advanced directives . List of other physicians . Hospitalizations, surgeries, and ER visits in previous 12 months . Vitals . Screenings to include cognitive, depression, and falls . Referrals and appointments  In addition, I have reviewed and discussed with patient certain preventive protocols, quality metrics, and best practice recommendations. A written personalized care plan for preventive services as well as general preventive health recommendations were provided to patient.     Clemetine Marker, LPN   [id number]/98/[id number]   Nurse Notes: none

## 2020-05-20 ENCOUNTER — Telehealth: Payer: Self-pay

## 2020-05-20 NOTE — Telephone Encounter (Signed)
Noted  KP 

## 2020-05-20 NOTE — Telephone Encounter (Signed)
Copied from [place] [id number]. Topic: General - Other >> May 20, 2020 10:50 AM Keene Breath wrote: Reason for CRM: Patient would like the nurse to call to talk about a tickle in her throat.  Did not want to make an appt. Right now.  CB# [phone]

## 2020-05-20 NOTE — Telephone Encounter (Signed)
Called pt left VM that she can use cough drops as well as peppermints. Told pt if she has any drainage take OTC allergy medication.  Will route result note to [facility] Nurse Triage for follow up when patient returns call to clinic. Nurse may give results to patient if they return call. CRM created for this message.    KP

## 2020-05-20 NOTE — Telephone Encounter (Signed)
Pt returned call.  The message from Dr. Army Melia was read to her regarding using cough drops and peppermint candy to help with the tickle in her throat causing her to cough.   Also OTC allergy medications.    She is going to try Claritin.     I instructed her to call us back if that doesn't help.  She verbalized that she would call us back.  Note routed to [facility] for Dr. Army Melia.

## 2020-05-22 DIAGNOSIS — M9903 Segmental and somatic dysfunction of lumbar region: Secondary | ICD-10-CM | POA: Diagnosis not present

## 2020-05-22 DIAGNOSIS — M955 Acquired deformity of pelvis: Secondary | ICD-10-CM | POA: Diagnosis not present

## 2020-05-22 DIAGNOSIS — M546 Pain in thoracic spine: Secondary | ICD-10-CM | POA: Diagnosis not present

## 2020-05-22 DIAGNOSIS — M9902 Segmental and somatic dysfunction of thoracic region: Secondary | ICD-10-CM | POA: Diagnosis not present

## 2020-05-22 DIAGNOSIS — M9905 Segmental and somatic dysfunction of pelvic region: Secondary | ICD-10-CM | POA: Diagnosis not present

## 2020-05-22 DIAGNOSIS — M5442 Lumbago with sciatica, left side: Secondary | ICD-10-CM | POA: Diagnosis not present

## 2020-06-25 DIAGNOSIS — M9902 Segmental and somatic dysfunction of thoracic region: Secondary | ICD-10-CM | POA: Diagnosis not present

## 2020-06-25 DIAGNOSIS — M5442 Lumbago with sciatica, left side: Secondary | ICD-10-CM | POA: Diagnosis not present

## 2020-06-25 DIAGNOSIS — M9903 Segmental and somatic dysfunction of lumbar region: Secondary | ICD-10-CM | POA: Diagnosis not present

## 2020-06-25 DIAGNOSIS — M9905 Segmental and somatic dysfunction of pelvic region: Secondary | ICD-10-CM | POA: Diagnosis not present

## 2020-06-25 DIAGNOSIS — M546 Pain in thoracic spine: Secondary | ICD-10-CM | POA: Diagnosis not present

## 2020-06-25 DIAGNOSIS — M955 Acquired deformity of pelvis: Secondary | ICD-10-CM | POA: Diagnosis not present

## 2020-07-22 DIAGNOSIS — H353132 Nonexudative age-related macular degeneration, bilateral, intermediate dry stage: Secondary | ICD-10-CM | POA: Diagnosis not present

## 2020-07-22 DIAGNOSIS — H0288B Meibomian gland dysfunction left eye, upper and lower eyelids: Secondary | ICD-10-CM | POA: Diagnosis not present

## 2020-07-22 DIAGNOSIS — H0288A Meibomian gland dysfunction right eye, upper and lower eyelids: Secondary | ICD-10-CM | POA: Diagnosis not present

## 2020-07-29 ENCOUNTER — Other Ambulatory Visit: Payer: Self-pay | Admitting: Internal Medicine

## 2020-07-31 DIAGNOSIS — M9903 Segmental and somatic dysfunction of lumbar region: Secondary | ICD-10-CM | POA: Diagnosis not present

## 2020-07-31 DIAGNOSIS — M546 Pain in thoracic spine: Secondary | ICD-10-CM | POA: Diagnosis not present

## 2020-07-31 DIAGNOSIS — M9905 Segmental and somatic dysfunction of pelvic region: Secondary | ICD-10-CM | POA: Diagnosis not present

## 2020-07-31 DIAGNOSIS — M955 Acquired deformity of pelvis: Secondary | ICD-10-CM | POA: Diagnosis not present

## 2020-07-31 DIAGNOSIS — M5442 Lumbago with sciatica, left side: Secondary | ICD-10-CM | POA: Diagnosis not present

## 2020-07-31 DIAGNOSIS — M9902 Segmental and somatic dysfunction of thoracic region: Secondary | ICD-10-CM | POA: Diagnosis not present

## 2020-08-07 DIAGNOSIS — I781 Nevus, non-neoplastic: Secondary | ICD-10-CM | POA: Diagnosis not present

## 2020-08-07 DIAGNOSIS — L570 Actinic keratosis: Secondary | ICD-10-CM | POA: Diagnosis not present

## 2020-09-07 ENCOUNTER — Other Ambulatory Visit: Payer: Self-pay | Admitting: Internal Medicine

## 2020-09-07 NOTE — Telephone Encounter (Signed)
Future OV scheduled for 01/30/21. Approved per protocol. Requested Prescriptions  Pending Prescriptions Disp Refills  . amLODipine (NORVASC) 10 MG tablet [Pharmacy Med Name: AMLODIPINE BESYLATE 10 MG TAB] 90 tablet 1    Sig: TAKE 1 TABLET BY MOUTH EVERY DAY     Cardiovascular:  Calcium Channel Blockers Failed - 09/07/2020 10:06 AM      Failed - Valid encounter within last 6 months    Recent Outpatient Visits          7 months ago Essential (primary) hypertension   [facility] Glean Hess, MD   1 year ago Essential (primary) hypertension   [facility] Glean Hess, MD   1 year ago Essential (primary) hypertension   [facility] Glean Hess, MD   2 years ago Essential (primary) hypertension   [facility] Glean Hess, MD   2 years ago Essential (primary) hypertension   [facility] Glean Hess, MD      Future Appointments            In 4 months Glean Hess, MD [facility], [place] BP in normal range    BP Readings from Last 1 Encounters:  05/13/20 120/80

## 2020-09-10 DIAGNOSIS — M9902 Segmental and somatic dysfunction of thoracic region: Secondary | ICD-10-CM | POA: Diagnosis not present

## 2020-09-10 DIAGNOSIS — M9903 Segmental and somatic dysfunction of lumbar region: Secondary | ICD-10-CM | POA: Diagnosis not present

## 2020-09-10 DIAGNOSIS — M9905 Segmental and somatic dysfunction of pelvic region: Secondary | ICD-10-CM | POA: Diagnosis not present

## 2020-09-10 DIAGNOSIS — M5442 Lumbago with sciatica, left side: Secondary | ICD-10-CM | POA: Diagnosis not present

## 2020-09-10 DIAGNOSIS — M955 Acquired deformity of pelvis: Secondary | ICD-10-CM | POA: Diagnosis not present

## 2020-09-10 DIAGNOSIS — M546 Pain in thoracic spine: Secondary | ICD-10-CM | POA: Diagnosis not present

## 2020-10-07 ENCOUNTER — Telehealth: Payer: Self-pay | Admitting: Internal Medicine

## 2020-10-07 NOTE — Telephone Encounter (Signed)
Apt scheduled 10/28/20

## 2020-10-09 DIAGNOSIS — M5442 Lumbago with sciatica, left side: Secondary | ICD-10-CM | POA: Diagnosis not present

## 2020-10-09 DIAGNOSIS — M955 Acquired deformity of pelvis: Secondary | ICD-10-CM | POA: Diagnosis not present

## 2020-10-09 DIAGNOSIS — M9902 Segmental and somatic dysfunction of thoracic region: Secondary | ICD-10-CM | POA: Diagnosis not present

## 2020-10-09 DIAGNOSIS — M9905 Segmental and somatic dysfunction of pelvic region: Secondary | ICD-10-CM | POA: Diagnosis not present

## 2020-10-09 DIAGNOSIS — M9903 Segmental and somatic dysfunction of lumbar region: Secondary | ICD-10-CM | POA: Diagnosis not present

## 2020-10-09 DIAGNOSIS — M546 Pain in thoracic spine: Secondary | ICD-10-CM | POA: Diagnosis not present

## 2020-10-28 ENCOUNTER — Other Ambulatory Visit: Payer: Self-pay

## 2020-10-28 ENCOUNTER — Encounter: Payer: Self-pay | Admitting: Internal Medicine

## 2020-10-28 ENCOUNTER — Ambulatory Visit (INDEPENDENT_AMBULATORY_CARE_PROVIDER_SITE_OTHER): Payer: Medicare Other | Admitting: Internal Medicine

## 2020-10-28 VITALS — BP 134/78 | HR 94 | Temp 97.6°F | Ht 61.0 in | Wt 134.0 lb

## 2020-10-28 DIAGNOSIS — E0590 Thyrotoxicosis, unspecified without thyrotoxic crisis or storm: Secondary | ICD-10-CM | POA: Diagnosis not present

## 2020-10-28 DIAGNOSIS — I10 Essential (primary) hypertension: Secondary | ICD-10-CM

## 2020-10-28 NOTE — Progress Notes (Signed)
Date:  10/28/2020   Name:  Danielle Herrera   DOB:  1933-06-15   MRN:  [id number]   Chief Complaint: Hypertension and Hypothyroidism  Hypertension This is a chronic problem. The problem is controlled (at home 123/78). Pertinent negatives include no chest pain, headaches, palpitations or shortness of breath. Past treatments include calcium channel blockers, ACE inhibitors and diuretics. Identifiable causes of hypertension include a thyroid problem.  Thyroid Problem Presents for follow-up visit. Patient reports no anxiety, constipation, diarrhea, fatigue or palpitations. The symptoms have been stable (treated by Endo with methimazole.).   Lab Results  Component Value Date   CREATININE 1.00 01/23/2020   BUN 19 01/23/2020   NA 144 01/23/2020   K 3.9 01/23/2020   CL 105 01/23/2020   CO2 23 01/23/2020   Lab Results  Component Value Date   CHOL 306 (H) 01/23/2020   HDL 92 01/23/2020   LDLCALC 197 (H) 01/23/2020   TRIG 102 01/23/2020   CHOLHDL 3.3 01/23/2020   Lab Results  Component Value Date   TSH 0.64 04/03/2020   No results found for: HGBA1C Lab Results  Component Value Date   WBC 6.0 04/03/2020   HGB 14.9 04/03/2020   HCT 47 (A) 04/03/2020   MCV 88 01/23/2020   PLT 290 04/03/2020   Lab Results  Component Value Date   ALT 15 01/23/2020   AST 23 01/23/2020   ALKPHOS 72 01/23/2020   BILITOT 0.3 01/23/2020     Review of Systems  Constitutional:  Negative for fatigue and unexpected weight change.  HENT:  Negative for nosebleeds.   Eyes:  Negative for visual disturbance.  Respiratory:  Negative for cough, chest tightness, shortness of breath and wheezing.   Cardiovascular:  Negative for chest pain, palpitations and leg swelling.  Gastrointestinal:  Negative for abdominal pain, constipation and diarrhea.  Allergic/Immunologic: Negative for environmental allergies.  Neurological:  Negative for dizziness, weakness, light-headedness and headaches.   Psychiatric/Behavioral:  Negative for dysphoric mood and sleep disturbance. The patient is not nervous/anxious.    Patient Active Problem List   Diagnosis Date Noted   Slow transit constipation 01/23/2020   Encounter for long-term (current) use of NSAIDs 05/26/2019   Benign essential tremor 03/10/2018   Insomnia 07/12/2017   Trochanteric bursitis of right hip 01/12/2017   Pseudophakia of right eye 12/12/2015   History of parathyroidectomy 07/10/2015   Hyperthyroidism, subclinical 04/22/2015   Hyperlipidemia, mixed 08/13/2014   Vitamin D deficiency 08/13/2014   Ventricular shunt in place 08/13/2014   Esophageal reflux 08/13/2014   Normal pressure hydrocephalus ([place]) 11/05/2012   Abnormal gait 08/26/2012   Goiter, nontoxic, multinodular 05/23/2012   History of acoustic neuroma 01/20/2010   Colon polyp 01/20/2010   Dependent edema 01/20/2010   Essential (primary) hypertension 01/20/2010   Arthritis of knee, left 01/20/2010   Osteopenia 01/20/2010    Allergies  Allergen Reactions   Tylenol [Acetaminophen] Other (See Comments)    Elevated LFTs    Past Surgical History:  Procedure Laterality Date   ABDOMINAL HYSTERECTOMY     ACOUSTIC NEUROMA RESECTION Right 1985   APPENDECTOMY     BRAIN SURGERY  09/08/2012   burr hole with biopsy; cranial tongs caliper/stereotactic frame right (Dr. Ralene Cork)   BREAST EXCISIONAL BIOPSY Left    benign   BREAST EXCISIONAL BIOPSY Left    benign   BREAST SURGERY Left    lumpectomy   CATARACT EXTRACTION Bilateral    right eye several years ago; left  eye fall 2021   COLON SURGERY     EXCISION/RELEASE BURSA HIP Right 10/29/2017   Procedure: EXCISION/RELEASE BURSA HIP- IT BAND;  Surgeon: Leim Fabry, MD;  Location: [facility] ORS;  Service: Orthopedics;  Laterality: Right;   EYE SURGERY Right    cataract   FRACTURE SURGERY Right    shoulder   PARATHYROIDECTOMY  2017   partial for hyperparathyroidism   SHOULDER ARTHROSCOPY W/ ACROMIAL  REPAIR Right 58yr ago   TUBAL LIGATION     VENTRICULOPERITONEAL SHUNT Right 09/08/2012   for NPH    Social History   Tobacco Use   Smoking status: Never   Smokeless tobacco: Never  Vaping Use   Vaping Use: Never used  Substance Use Topics   Alcohol use: Yes    Alcohol/week: 3.0 standard drinks    Types: 2 Glasses of wine, 1 Standard drinks or equivalent per week    Comment: Occasionally   Drug use: Never     Medication list has been reviewed and updated.  Current Meds  Medication Sig   amLODipine (NORVASC) 10 MG tablet TAKE 1 TABLET BY MOUTH EVERY DAY   carboxymethylcellul-glycerin (OPTIVE) 0.5-0.9 % ophthalmic solution Place 1 drop into both eyes 2 (two) times daily.    cholecalciferol (VITAMIN D3) 25 MCG (1000 UNIT) tablet Take 1,000 Units by mouth daily.   diphenhydrAMINE (BENADRYL) 25 MG tablet Take 25 mg by mouth at bedtime.   furosemide (LASIX) 20 MG tablet TAKE 1 TABLET BY MOUTH EVERY DAY   lisinopril (ZESTRIL) 10 MG tablet TAKE 1 TABLET BY MOUTH EVERY DAY   methimazole (TAPAZOLE) 5 MG tablet TAKE 1 TABLET (5 MG TOTAL) BY MOUTH DAILY.   Multiple Vitamin (MULTIVITAMIN WITH MINERALS) TABS tablet Take 1 tablet by mouth daily. Centrum Silver for Women 50+   naproxen sodium (ALEVE) 220 MG tablet Take 2 tablets by mouth daily.    sennosides-docusate sodium (SENOKOT-S) 8.6-50 MG tablet Take 2 tablets by mouth 2 (two) times daily.     PHQ 2/9 Scores 10/28/2020 05/13/2020 01/23/2020 05/26/2019  PHQ - 2 Score 0 0 0 0  PHQ- 9 Score 0 - 0 0    GAD 7 : Generalized Anxiety Score 10/28/2020 01/23/2020  Nervous, Anxious, on Edge 0 0  Control/stop worrying 0 0  Worry too much - different things 0 0  Trouble relaxing 0 0  Restless 0 0  Easily annoyed or irritable 0 0  Afraid - awful might happen 0 0  Total GAD 7 Score 0 0    BP Readings from Last 3 Encounters:  10/28/20 134/78  05/13/20 120/80  01/23/20 132/86    Physical Exam Vitals and nursing note reviewed.   Constitutional:      General: She is not in acute distress.    Appearance: She is well-developed.  HENT:     Head: Normocephalic and atraumatic.  Cardiovascular:     Rate and Rhythm: Normal rate and regular rhythm.     Pulses: Normal pulses.  Pulmonary:     Effort: Pulmonary effort is normal. No respiratory distress.     Breath sounds: No wheezing or rhonchi.  Musculoskeletal:        General: Swelling present.     Cervical back: Normal range of motion.     Right lower leg: No edema.     Left lower leg: No edema.  Lymphadenopathy:     Cervical: No cervical adenopathy.  Skin:    General: Skin is warm and dry.  Findings: No rash.  Neurological:     General: No focal deficit present.     Mental Status: She is alert and oriented to person, place, and time.     Gait: Gait normal.  Psychiatric:        Mood and Affect: Mood normal.        Behavior: Behavior normal.    Wt Readings from Last 3 Encounters:  10/28/20 134 lb (60.8 kg)  05/13/20 133 lb 12.8 oz (60.7 kg)  01/23/20 131 lb (59.4 kg)    BP 134/78   Pulse 94   Temp 97.6 F (36.4 C) (Oral)   Ht '5\' 1"'$  (1.549 m)   Wt 134 lb (60.8 kg)   SpO2 98%   BMI 25.32 kg/m   Assessment and Plan: 1. Essential (primary) hypertension Clinically stable exam with well controlled BP. Tolerating medications without side effects at this time. Pt to continue current regimen and low sodium diet; benefits of regular exercise as able discussed.  2. Hyperthyroidism, subclinical Being followed and treated by Endocrinology   Partially dictated using Editor, commissioning. Any errors are unintentional.  Halina Maidens, MD [facility]  10/28/2020

## 2020-11-13 DIAGNOSIS — M9905 Segmental and somatic dysfunction of pelvic region: Secondary | ICD-10-CM | POA: Diagnosis not present

## 2020-11-13 DIAGNOSIS — M955 Acquired deformity of pelvis: Secondary | ICD-10-CM | POA: Diagnosis not present

## 2020-11-13 DIAGNOSIS — M5442 Lumbago with sciatica, left side: Secondary | ICD-10-CM | POA: Diagnosis not present

## 2020-11-13 DIAGNOSIS — M9902 Segmental and somatic dysfunction of thoracic region: Secondary | ICD-10-CM | POA: Diagnosis not present

## 2020-11-13 DIAGNOSIS — M9903 Segmental and somatic dysfunction of lumbar region: Secondary | ICD-10-CM | POA: Diagnosis not present

## 2020-11-13 DIAGNOSIS — M546 Pain in thoracic spine: Secondary | ICD-10-CM | POA: Diagnosis not present

## 2020-11-20 ENCOUNTER — Telehealth: Payer: Self-pay

## 2020-11-20 DIAGNOSIS — Z872 Personal history of diseases of the skin and subcutaneous tissue: Secondary | ICD-10-CM | POA: Diagnosis not present

## 2020-11-20 DIAGNOSIS — L578 Other skin changes due to chronic exposure to nonionizing radiation: Secondary | ICD-10-CM | POA: Diagnosis not present

## 2020-11-20 DIAGNOSIS — L570 Actinic keratosis: Secondary | ICD-10-CM | POA: Diagnosis not present

## 2020-11-20 DIAGNOSIS — Z86018 Personal history of other benign neoplasm: Secondary | ICD-10-CM | POA: Diagnosis not present

## 2020-11-20 DIAGNOSIS — Z85828 Personal history of other malignant neoplasm of skin: Secondary | ICD-10-CM | POA: Diagnosis not present

## 2020-11-20 NOTE — Telephone Encounter (Signed)
Copied from [place] [id number]. Topic: General - Other >> Nov 20, 2020  3:26 PM Bayard Beaver wrote: Reason for ILO:[id number] called about what kind of pneumonia she should get as she had in he past. Please call back   Informed her she has had more than enough pneumonia vaccines, and she does not need anymore.

## 2020-12-11 DIAGNOSIS — M5442 Lumbago with sciatica, left side: Secondary | ICD-10-CM | POA: Diagnosis not present

## 2020-12-11 DIAGNOSIS — M9905 Segmental and somatic dysfunction of pelvic region: Secondary | ICD-10-CM | POA: Diagnosis not present

## 2020-12-11 DIAGNOSIS — M9902 Segmental and somatic dysfunction of thoracic region: Secondary | ICD-10-CM | POA: Diagnosis not present

## 2020-12-11 DIAGNOSIS — M546 Pain in thoracic spine: Secondary | ICD-10-CM | POA: Diagnosis not present

## 2020-12-11 DIAGNOSIS — M9903 Segmental and somatic dysfunction of lumbar region: Secondary | ICD-10-CM | POA: Diagnosis not present

## 2020-12-11 DIAGNOSIS — M955 Acquired deformity of pelvis: Secondary | ICD-10-CM | POA: Diagnosis not present

## 2020-12-23 ENCOUNTER — Other Ambulatory Visit: Payer: Self-pay

## 2020-12-23 ENCOUNTER — Ambulatory Visit (INDEPENDENT_AMBULATORY_CARE_PROVIDER_SITE_OTHER): Payer: Medicare Other

## 2020-12-23 ENCOUNTER — Other Ambulatory Visit: Payer: Self-pay | Admitting: Internal Medicine

## 2020-12-23 DIAGNOSIS — Z23 Encounter for immunization: Secondary | ICD-10-CM

## 2020-12-23 NOTE — Telephone Encounter (Signed)
Requested Prescriptions  Pending Prescriptions Disp Refills  . amLODipine (NORVASC) 10 MG tablet [Pharmacy Med Name: AMLODIPINE BESYLATE 10 MG TAB] 90 tablet 0    Sig: TAKE 1 TABLET BY MOUTH EVERY DAY     Cardiovascular:  Calcium Channel Blockers Passed - 12/23/2020 12:00 PM      Passed - Last BP in normal range    BP Readings from Last 1 Encounters:  10/28/20 134/78         Passed - Valid encounter within last 6 months    Recent Outpatient Visits          1 month ago Essential (primary) hypertension   [facility] Glean Hess, MD   11 months ago Essential (primary) hypertension   [facility] Glean Hess, MD   1 year ago Essential (primary) hypertension   [facility] Glean Hess, MD   1 year ago Essential (primary) hypertension   [facility] Glean Hess, MD   2 years ago Essential (primary) hypertension   [facility] Glean Hess, MD      Future Appointments            In 1 month Army Melia, Jesse Sans, MD [facility]

## 2021-01-02 DIAGNOSIS — M25552 Pain in left hip: Secondary | ICD-10-CM | POA: Diagnosis not present

## 2021-01-06 ENCOUNTER — Encounter: Payer: Self-pay | Admitting: Internal Medicine

## 2021-01-06 ENCOUNTER — Ambulatory Visit (INDEPENDENT_AMBULATORY_CARE_PROVIDER_SITE_OTHER): Payer: Medicare Other | Admitting: Internal Medicine

## 2021-01-06 ENCOUNTER — Other Ambulatory Visit: Payer: Self-pay

## 2021-01-06 ENCOUNTER — Ambulatory Visit: Payer: Self-pay

## 2021-01-06 VITALS — BP 132/80 | HR 88 | Ht 61.0 in | Wt 135.2 lb

## 2021-01-06 DIAGNOSIS — E892 Postprocedural hypoparathyroidism: Secondary | ICD-10-CM | POA: Diagnosis not present

## 2021-01-06 DIAGNOSIS — I10 Essential (primary) hypertension: Secondary | ICD-10-CM | POA: Diagnosis not present

## 2021-01-06 DIAGNOSIS — R1319 Other dysphagia: Secondary | ICD-10-CM

## 2021-01-06 NOTE — Progress Notes (Signed)
Date:  01/06/2021   Name:  Danielle Herrera   DOB:  Jul 28, 1933   MRN:  [id number]   Chief Complaint: Dysphagia  Dysphagia:  This is a new problem. This problem start  2-3 weeks ago. The problem is intermittent. Associated symptoms include food feeling stuck in esophagus. No other symptoms. 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 Patient also has thyroid goiter.  She was eating an apple fritter yesterday when a piece stuck in her upper esophagus.  She drank coffee to wash it down and the coffee came up through her nose.  She relaxed for a while and the fritter passed.  After that she was able to eat the rest of her breakfast.  Since then no issues.  It has not happened before. She does have some minor vocal cord paralysis on the right since her parathyroid surgery but this episode was different.  Hypertension This is a chronic problem. The problem is controlled. Pertinent negatives include no chest pain, headaches, palpitations or shortness of breath. Past treatments include ACE inhibitors and calcium channel blockers. The current treatment provides significant improvement.   Lab Results  Component Value Date   CREATININE 1.00 01/23/2020   BUN 19 01/23/2020   NA 144 01/23/2020   K 3.9 01/23/2020   CL 105 01/23/2020   CO2 23 01/23/2020   Lab Results  Component Value Date   CHOL 306 (H) 01/23/2020   HDL 92 01/23/2020   LDLCALC 197 (H) 01/23/2020   TRIG 102 01/23/2020   CHOLHDL 3.3 01/23/2020   Lab Results  Component Value Date   TSH 0.64 04/03/2020   No results found for: HGBA1C Lab Results  Component Value Date   WBC 6.0 04/03/2020   HGB 14.9 04/03/2020   HCT 47 (A) 04/03/2020   MCV 88 01/23/2020   PLT 290 04/03/2020   Lab Results  Component Value Date   ALT 15 01/23/2020   AST 23 01/23/2020   ALKPHOS 72 01/23/2020   BILITOT 0.3 01/23/2020     Review of Systems  Constitutional:  Negative for chills, fatigue, fever and unexpected  weight change.  Respiratory:  Positive for choking. Negative for shortness of breath and wheezing.   Cardiovascular:  Negative for chest pain and palpitations.  Neurological:  Negative for dizziness and headaches.  Psychiatric/Behavioral:  Negative for dysphoric mood and sleep disturbance. The patient is not nervous/anxious.    Patient Active Problem List   Diagnosis Date Noted   Slow transit constipation 01/23/2020   Encounter for long-term (current) use of NSAIDs 05/26/2019   Benign essential tremor 03/10/2018   Insomnia 07/12/2017   Trochanteric bursitis of right hip 01/12/2017   Pseudophakia of right eye 12/12/2015   History of parathyroidectomy 07/10/2015   Hyperthyroidism, subclinical 04/22/2015   Hyperlipidemia, mixed 08/13/2014   Vitamin D deficiency 08/13/2014   Ventricular shunt in place 08/13/2014   Esophageal reflux 08/13/2014   Normal pressure hydrocephalus ([place]) 11/05/2012   Abnormal gait 08/26/2012   Goiter, nontoxic, multinodular 05/23/2012   History of acoustic neuroma 01/20/2010   Colon polyp 01/20/2010   Dependent edema 01/20/2010   Essential (primary) hypertension 01/20/2010   Arthritis of knee, left 01/20/2010   Osteopenia 01/20/2010    Allergies  Allergen Reactions   Tylenol [Acetaminophen] Other (See Comments)    Elevated LFTs    Past Surgical History:  Procedure Laterality Date   ABDOMINAL HYSTERECTOMY     ACOUSTIC NEUROMA RESECTION Right 1985  APPENDECTOMY     BRAIN SURGERY  09/08/2012   burr hole with biopsy; cranial tongs caliper/stereotactic frame right (Dr. Ralene Cork)   BREAST EXCISIONAL BIOPSY Left    benign   BREAST EXCISIONAL BIOPSY Left    benign   BREAST SURGERY Left    lumpectomy   CATARACT EXTRACTION Bilateral    right eye several years ago; left eye fall 2021   COLON SURGERY     EXCISION/RELEASE BURSA HIP Right 10/29/2017   Procedure: EXCISION/RELEASE BURSA HIP- IT BAND;  Surgeon: Leim Fabry, MD;  Location: [facility]  ORS;  Service: Orthopedics;  Laterality: Right;   EYE SURGERY Right    cataract   FRACTURE SURGERY Right    shoulder   PARATHYROIDECTOMY  2017   partial for hyperparathyroidism   SHOULDER ARTHROSCOPY W/ ACROMIAL REPAIR Right 17yrs ago   TUBAL LIGATION     VENTRICULOPERITONEAL SHUNT Right 09/08/2012   for NPH    Social History   Tobacco Use   Smoking status: Never   Smokeless tobacco: Never  Vaping Use   Vaping Use: Never used  Substance Use Topics   Alcohol use: Yes    Alcohol/week: 3.0 standard drinks    Types: 2 Glasses of wine, 1 Standard drinks or equivalent per week    Comment: Occasionally   Drug use: Never     Medication list has been reviewed and updated.  Current Meds  Medication Sig   amLODipine (NORVASC) 10 MG tablet TAKE 1 TABLET BY MOUTH EVERY DAY   carboxymethylcellul-glycerin (OPTIVE) 0.5-0.9 % ophthalmic solution Place 1 drop into both eyes 2 (two) times daily.    cholecalciferol (VITAMIN D3) 25 MCG (1000 UNIT) tablet Take 1,000 Units by mouth daily.   diphenhydrAMINE (BENADRYL) 25 MG tablet Take 25 mg by mouth at bedtime.   furosemide (LASIX) 20 MG tablet TAKE 1 TABLET BY MOUTH EVERY DAY   lisinopril (ZESTRIL) 10 MG tablet TAKE 1 TABLET BY MOUTH EVERY DAY   methimazole (TAPAZOLE) 5 MG tablet TAKE 1 TABLET (5 MG TOTAL) BY MOUTH DAILY.   Multiple Vitamin (MULTIVITAMIN WITH MINERALS) TABS tablet Take 1 tablet by mouth daily. Centrum Silver for Women 50+   naproxen sodium (ALEVE) 220 MG tablet Take 4 tablets by mouth daily.   sennosides-docusate sodium (SENOKOT-S) 8.6-50 MG tablet Take 2 tablets by mouth 2 (two) times daily.     PHQ 2/9 Scores 01/06/2021 10/28/2020 05/13/2020 01/23/2020  PHQ - 2 Score 0 0 0 0  PHQ- 9 Score 0 0 - 0    GAD 7 : Generalized Anxiety Score 01/06/2021 10/28/2020 01/23/2020  Nervous, Anxious, on Edge 0 0 0  Control/stop worrying 0 0 0  Worry too much - different things 0 0 0  Trouble relaxing 0 0 0  Restless 0 0 0  Easily  annoyed or irritable 0 0 0  Afraid - awful might happen 0 0 0  Total GAD 7 Score 0 0 0  Anxiety Difficulty Not difficult at all - -    BP Readings from Last 3 Encounters:  01/06/21 132/80  10/28/20 134/78  05/13/20 120/80    Physical Exam Vitals and nursing note reviewed.  Constitutional:      General: She is not in acute distress.    Appearance: Normal appearance. She is well-developed.  HENT:     Head: Normocephalic and atraumatic.  Cardiovascular:     Rate and Rhythm: Normal rate and regular rhythm.     Pulses: Normal pulses.  Pulmonary:  Effort: Pulmonary effort is normal. No respiratory distress.     Breath sounds: No wheezing or rhonchi.  Musculoskeletal:        General: Swelling present.     Cervical back: Normal range of motion and neck supple.     Right lower leg: No edema.     Left lower leg: No edema.  Lymphadenopathy:     Cervical: No cervical adenopathy.  Skin:    General: Skin is warm and dry.     Findings: No rash.  Neurological:     Mental Status: She is alert and oriented to person, place, and time.  Psychiatric:        Mood and Affect: Mood normal.        Behavior: Behavior normal.    Wt Readings from Last 3 Encounters:  01/06/21 135 lb 3.2 oz (61.3 kg)  10/28/20 134 lb (60.8 kg)  05/13/20 133 lb 12.8 oz (60.7 kg)    BP 132/80   Pulse 88   Ht 5\' 1"  (1.549 m)   Wt 135 lb 3.2 oz (61.3 kg)   SpO2 98%   BMI 25.55 kg/m   Assessment and Plan: 1. Esophageal dysphagia One episode likely due to esophageal spasm.  No clear trigger by history so hopefully it will be an isolate incident. She is advised regarding what to do if it happens again - if sx persist she should go the the ED. Continue to eat slowly and chew thoroughly.  2. Essential (primary) hypertension Clinically stable exam with well controlled BP. Tolerating medications without side effects at this time. Pt to continue current regimen and low sodium diet; benefits of regular  exercise as able discussed.  3. S/P parathyroidectomy ([place]) Mild vocal cord dysfunction not currently causing any problems   Partially dictated using Editor, commissioning. Any errors are unintentional.  Halina Maidens, MD [facility]  01/06/2021

## 2021-01-06 NOTE — Telephone Encounter (Signed)
Pt c/o when she swallows food, feels lie it gets stuck midway down. Usually drinking fluids clear the food. Yesterday when she attempted the same maneuver, coffee would not go down and went out her nose.  Swallowing problems began 1 week ago. Care advice given and pt verbalized understanding.  Appt given with PCP today.            Reason for Disposition  [1] Swallowing difficulty AND [2] cause unknown (Exception: difficulty swallowing is a chronic symptom)  Answer Assessment - Initial Assessment Questions 1. SYMPTOM: "Are you having difficulty swallowing liquids, solids, or both?"     Swallowing food and coffee went out 2. ONSET: "When did the swallowing problems begin?"      1 week ago 3. CAUSE: "What do you think is causing the problem?"      Doesn't know 4. CHRONIC/RECURRENT: "Is this a new problem for you?"  If no, ask: "How long have you had this problem?" (e.g., days, weeks, months)      yes 5. OTHER SYMPTOMS: "Do you have any other symptoms?" (e.g., difficulty breathing, sore throat, swollen tongue, chest pain)    no 6. PREGNANCY: "Is there any chance you are pregnant?" "When was your last menstrual period?"     N/a  Protocols used: Swallowing Difficulty-A-AH

## 2021-01-08 DIAGNOSIS — M9902 Segmental and somatic dysfunction of thoracic region: Secondary | ICD-10-CM | POA: Diagnosis not present

## 2021-01-08 DIAGNOSIS — M5442 Lumbago with sciatica, left side: Secondary | ICD-10-CM | POA: Diagnosis not present

## 2021-01-08 DIAGNOSIS — M546 Pain in thoracic spine: Secondary | ICD-10-CM | POA: Diagnosis not present

## 2021-01-08 DIAGNOSIS — M9903 Segmental and somatic dysfunction of lumbar region: Secondary | ICD-10-CM | POA: Diagnosis not present

## 2021-01-08 DIAGNOSIS — M955 Acquired deformity of pelvis: Secondary | ICD-10-CM | POA: Diagnosis not present

## 2021-01-08 DIAGNOSIS — M9905 Segmental and somatic dysfunction of pelvic region: Secondary | ICD-10-CM | POA: Diagnosis not present

## 2021-01-21 DIAGNOSIS — H353132 Nonexudative age-related macular degeneration, bilateral, intermediate dry stage: Secondary | ICD-10-CM | POA: Diagnosis not present

## 2021-01-21 DIAGNOSIS — H0288A Meibomian gland dysfunction right eye, upper and lower eyelids: Secondary | ICD-10-CM | POA: Diagnosis not present

## 2021-01-21 DIAGNOSIS — H0288B Meibomian gland dysfunction left eye, upper and lower eyelids: Secondary | ICD-10-CM | POA: Diagnosis not present

## 2021-01-22 ENCOUNTER — Other Ambulatory Visit: Payer: Self-pay | Admitting: Internal Medicine

## 2021-01-22 NOTE — Telephone Encounter (Signed)
Requested medications are due for refill today.  yes  Requested medications are on the active medications list.  yes  Last refill. Lisinopril 07/30/2020, lasix 02/05/2020  Future visit scheduled.   yes  Notes to clinic.  Labs are expired.

## 2021-01-30 ENCOUNTER — Encounter: Payer: Self-pay | Admitting: Internal Medicine

## 2021-01-30 ENCOUNTER — Ambulatory Visit (INDEPENDENT_AMBULATORY_CARE_PROVIDER_SITE_OTHER): Payer: Medicare Other | Admitting: Internal Medicine

## 2021-01-30 ENCOUNTER — Other Ambulatory Visit: Payer: Self-pay

## 2021-01-30 VITALS — BP 132/80 | HR 78 | Ht 61.0 in | Wt 133.6 lb

## 2021-01-30 DIAGNOSIS — I10 Essential (primary) hypertension: Secondary | ICD-10-CM

## 2021-01-30 DIAGNOSIS — E782 Mixed hyperlipidemia: Secondary | ICD-10-CM

## 2021-01-30 DIAGNOSIS — Z1231 Encounter for screening mammogram for malignant neoplasm of breast: Secondary | ICD-10-CM

## 2021-01-30 DIAGNOSIS — E0590 Thyrotoxicosis, unspecified without thyrotoxic crisis or storm: Secondary | ICD-10-CM | POA: Diagnosis not present

## 2021-01-30 DIAGNOSIS — G250 Essential tremor: Secondary | ICD-10-CM | POA: Diagnosis not present

## 2021-01-30 LAB — POCT URINALYSIS DIPSTICK
Bilirubin, UA: NEGATIVE
Blood, UA: NEGATIVE
Glucose, UA: NEGATIVE
Ketones, UA: NEGATIVE
Leukocytes, UA: NEGATIVE
Nitrite, UA: NEGATIVE
Protein, UA: NEGATIVE
Spec Grav, UA: 1.01 (ref 1.010–1.025)
Urobilinogen, UA: 0.2 E.U./dL
pH, UA: 6 (ref 5.0–8.0)

## 2021-01-30 MED ORDER — SCOPOLAMINE 1 MG/3DAYS TD PT72: 1.0000 | MEDICATED_PATCH | TRANSDERMAL | 0 refills | Status: DC

## 2021-01-30 NOTE — Progress Notes (Signed)
Date:  01/30/2021   Name:  Danielle Herrera   DOB:  Jun 17, 1933   MRN:  [id number]   Chief Complaint: Annual Exam Danielle Herrera is a 87 y.o. female who presents today for her Complete Annual Exam. She feels well. She reports exercising very little. She reports she is sleeping fairly well. Breast complaints - none.  Mammogram: 01/2020 DEXA: 06/2016 osteopenia Colonoscopy: aged out  Immunization History  Administered Date(s) Administered   Fluad Quad(high Dose 65+) 11/08/2018   Influenza, High Dose Seasonal PF 11/22/2017, 11/07/2019   Influenza,inj,Quad PF,6+ Mos 12/23/2020   Influenza-Unspecified 11/26/2011, 11/30/2016, 11/21/2018, 12/23/2020   PFIZER Comirnaty(Gray Top)Covid-19 Tri-Sucrose Vaccine 03/15/2019, 04/06/2019, 11/27/2019, 06/06/2020   PFIZER(Purple Top)SARS-COV-2 Vaccination 03/15/2019, 04/06/2019   Pfizer Covid-19 Vaccine Bivalent Booster 46yrs & up 12/30/2020   Pneumococcal Conjugate-13 04/24/2014, 03/02/2016   Pneumococcal Polysaccharide-23 05/31/2010, 07/01/2012, 11/21/2015   Pneumococcal-Unspecified 10/31/2012   Tdap 08/13/2014   Zoster Recombinat (Shingrix) 07/03/2016, 08/30/2016   Zoster, Live 03/02/2010    Hypertension This is a chronic problem. The problem is controlled. Pertinent negatives include no chest pain, headaches, palpitations or shortness of breath. Past treatments include calcium channel blockers and ACE inhibitors. The current treatment provides significant improvement. Identifiable causes of hypertension include a thyroid problem.  Thyroid Problem Presents for follow-up (followed by [facility]) visit. Patient reports no anxiety, constipation, diarrhea, fatigue, palpitations or tremors. The symptoms have been stable.   Lab Results  Component Value Date   NA 144 01/23/2020   K 3.9 01/23/2020   CO2 23 01/23/2020   GLUCOSE 95 01/23/2020   BUN 19 01/23/2020   CREATININE 1.00 01/23/2020   CALCIUM 10.1 01/23/2020   GFRNONAA 52 (L)  01/23/2020   Lab Results  Component Value Date   CHOL 306 (H) 01/23/2020   HDL 92 01/23/2020   LDLCALC 197 (H) 01/23/2020   TRIG 102 01/23/2020   CHOLHDL 3.3 01/23/2020   Lab Results  Component Value Date   TSH 0.64 04/03/2020   No results found for: HGBA1C Lab Results  Component Value Date   WBC 6.0 04/03/2020   HGB 14.9 04/03/2020   HCT 47 (A) 04/03/2020   MCV 88 01/23/2020   PLT 290 04/03/2020   Lab Results  Component Value Date   ALT 15 01/23/2020   AST 23 01/23/2020   ALKPHOS 72 01/23/2020   BILITOT 0.3 01/23/2020   Last vitamin D = 48  Review of Systems  Constitutional:  Negative for chills, fatigue and fever.  HENT:  Negative for congestion, hearing loss, tinnitus, trouble swallowing and voice change.   Eyes:  Negative for visual disturbance.  Respiratory:  Negative for cough, chest tightness, shortness of breath and wheezing.   Cardiovascular:  Negative for chest pain, palpitations and leg swelling.  Gastrointestinal:  Negative for abdominal pain, constipation, diarrhea and vomiting.  Endocrine: Negative for polydipsia and polyuria.  Genitourinary:  Negative for dysuria, frequency, genital sores, vaginal bleeding and vaginal discharge.  Musculoskeletal:  Negative for arthralgias, gait problem and joint swelling.  Skin:  Negative for color change and rash.  Neurological:  Negative for dizziness, tremors, light-headedness and headaches.  Hematological:  Negative for adenopathy. Does not bruise/bleed easily.  Psychiatric/Behavioral:  Negative for dysphoric mood and sleep disturbance. The patient is not nervous/anxious.    Patient Active Problem List   Diagnosis Date Noted   Slow transit constipation 01/23/2020   Encounter for long-term (current) use of NSAIDs 05/26/2019   Benign essential tremor 03/10/2018   Insomnia  07/12/2017   Trochanteric bursitis of right hip 01/12/2017   Pseudophakia of right eye 12/12/2015   History of parathyroidectomy 07/10/2015    Hyperthyroidism, subclinical 04/22/2015   Hyperlipidemia, mixed 08/13/2014   Vitamin D deficiency 08/13/2014   Ventricular shunt in place 08/13/2014   Esophageal reflux 08/13/2014   Normal pressure hydrocephalus ([place]) 11/05/2012   Abnormal gait 08/26/2012   Goiter, nontoxic, multinodular 05/23/2012   History of acoustic neuroma 01/20/2010   Colon polyp 01/20/2010   Dependent edema 01/20/2010   Essential (primary) hypertension 01/20/2010   Arthritis of knee, left 01/20/2010   Osteopenia 01/20/2010    Allergies  Allergen Reactions   Tylenol [Acetaminophen] Other (See Comments)    Elevated LFTs    Past Surgical History:  Procedure Laterality Date   ABDOMINAL HYSTERECTOMY     ACOUSTIC NEUROMA RESECTION Right 1985   APPENDECTOMY     BRAIN SURGERY  09/08/2012   burr hole with biopsy; cranial tongs caliper/stereotactic frame right (Dr. Ralene Cork)   BREAST EXCISIONAL BIOPSY Left    benign   BREAST EXCISIONAL BIOPSY Left    benign   BREAST SURGERY Left    lumpectomy   CATARACT EXTRACTION Bilateral    right eye several years ago; left eye fall 2021   COLON SURGERY     EXCISION/RELEASE BURSA HIP Right 10/29/2017   Procedure: EXCISION/RELEASE BURSA HIP- IT BAND;  Surgeon: Leim Fabry, MD;  Location: [facility] ORS;  Service: Orthopedics;  Laterality: Right;   EYE SURGERY Right    cataract   FRACTURE SURGERY Right    shoulder   PARATHYROIDECTOMY  2017   partial for hyperparathyroidism   SHOULDER ARTHROSCOPY W/ ACROMIAL REPAIR Right 89yrs ago   TUBAL LIGATION     VENTRICULOPERITONEAL SHUNT Right 09/08/2012   for NPH    Social History   Tobacco Use   Smoking status: Never   Smokeless tobacco: Never  Vaping Use   Vaping Use: Never used  Substance Use Topics   Alcohol use: Yes    Alcohol/week: 3.0 standard drinks    Types: 2 Glasses of wine, 1 Standard drinks or equivalent per week    Comment: Occasionally   Drug use: Never     Medication list has been reviewed  and updated.  Current Meds  Medication Sig   amLODipine (NORVASC) 10 MG tablet TAKE 1 TABLET BY MOUTH EVERY DAY   carboxymethylcellul-glycerin (OPTIVE) 0.5-0.9 % ophthalmic solution Place 1 drop into both eyes 2 (two) times daily.    cholecalciferol (VITAMIN D3) 25 MCG (1000 UNIT) tablet Take 1,000 Units by mouth daily.   diphenhydrAMINE (BENADRYL) 25 MG tablet Take 25 mg by mouth at bedtime.   furosemide (LASIX) 20 MG tablet TAKE 1 TABLET BY MOUTH EVERY DAY   lisinopril (ZESTRIL) 10 MG tablet TAKE 1 TABLET BY MOUTH EVERY DAY   methimazole (TAPAZOLE) 5 MG tablet TAKE 1 TABLET (5 MG TOTAL) BY MOUTH DAILY.   Multiple Vitamin (MULTIVITAMIN WITH MINERALS) TABS tablet Take 1 tablet by mouth daily. Centrum Silver for Women 50+   naproxen sodium (ALEVE) 220 MG tablet Take 4 tablets by mouth daily.   sennosides-docusate sodium (SENOKOT-S) 8.6-50 MG tablet Take 2 tablets by mouth 2 (two) times daily.     PHQ 2/9 Scores 01/30/2021 01/06/2021 10/28/2020 05/13/2020  PHQ - 2 Score 0 0 0 0  PHQ- 9 Score 0 0 0 -    GAD 7 : Generalized Anxiety Score 01/30/2021 01/06/2021 10/28/2020 01/23/2020  Nervous, Anxious, on Edge 0 0  0 0  Control/stop worrying 0 0 0 0  Worry too much - different things 0 0 0 0  Trouble relaxing 0 0 0 0  Restless 0 0 0 0  Easily annoyed or irritable 0 0 0 0  Afraid - awful might happen 0 0 0 0  Total GAD 7 Score 0 0 0 0  Anxiety Difficulty Not difficult at all Not difficult at all - -    BP Readings from Last 3 Encounters:  01/30/21 132/80  01/06/21 132/80  10/28/20 134/78    Physical Exam Vitals and nursing note reviewed.  Constitutional:      General: She is not in acute distress.    Appearance: She is well-developed.  HENT:     Head: Normocephalic and atraumatic.     Right Ear: Ear canal normal. Decreased hearing noted. There is impacted cerumen.     Left Ear: Ear canal normal. Decreased hearing noted. There is impacted cerumen.     Nose:     Right Sinus: No  maxillary sinus tenderness.     Left Sinus: No maxillary sinus tenderness.  Eyes:     General: No scleral icterus.       Right eye: No discharge.        Left eye: No discharge.     Conjunctiva/sclera: Conjunctivae normal.  Neck:     Thyroid: No thyromegaly.     Vascular: No carotid bruit.  Cardiovascular:     Rate and Rhythm: Normal rate and regular rhythm.     Pulses: Normal pulses.     Heart sounds: Normal heart sounds.  Pulmonary:     Effort: Pulmonary effort is normal. No respiratory distress.     Breath sounds: No wheezing.  Chest:  Breasts:    Right: No mass, nipple discharge, skin change or tenderness.     Left: No mass, nipple discharge, skin change or tenderness.  Abdominal:     General: Bowel sounds are normal.     Palpations: Abdomen is soft.     Tenderness: There is no abdominal tenderness.  Musculoskeletal:     Cervical back: Normal range of motion. No erythema.     Right lower leg: No edema.     Left lower leg: No edema.  Lymphadenopathy:     Cervical: No cervical adenopathy.  Skin:    General: Skin is warm and dry.     Findings: No rash.  Neurological:     Mental Status: She is alert and oriented to person, place, and time.     Cranial Nerves: No cranial nerve deficit.     Sensory: No sensory deficit.     Motor: Tremor (fine tremor of head and hands) present.     Deep Tendon Reflexes: Reflexes are normal and symmetric.  Psychiatric:        Attention and Perception: Attention normal.        Mood and Affect: Mood normal.    Wt Readings from Last 3 Encounters:  01/30/21 133 lb 9.6 oz (60.6 kg)  01/06/21 135 lb 3.2 oz (61.3 kg)  10/28/20 134 lb (60.8 kg)    BP 132/80   Pulse 78   Ht 5\' 1"  (1.549 m)   Wt 133 lb 9.6 oz (60.6 kg)   SpO2 98%   BMI 25.24 kg/m   Assessment and Plan: 1. Essential (primary) hypertension Clinically stable exam with well controlled BP. Tolerating medications without side effects at this time. Pt to continue current  regimen and low  sodium diet; benefits of regular exercise as able discussed. - CBC with Differential/Platelet - Comprehensive metabolic panel - POCT urinalysis dipstick  2. Hyperthyroidism, subclinical Followed by [facility]  3. Hyperlipidemia, mixed Check labs, continue low fat diet - Lipid panel  4. Encounter for screening mammogram for breast cancer Schedule at [facility] - MM 3D SCREEN BREAST BILATERAL  5. Benign essential tremor Mild and not interfering significantly Can follow up with Neurology if worsening   Partially dictated using [place]. Any errors are unintentional.  Halina Maidens, MD [facility]  01/30/2021

## 2021-01-31 LAB — COMPREHENSIVE METABOLIC PANEL
ALT: 13 IU/L (ref 0–32)
AST: 19 IU/L (ref 0–40)
Albumin/Globulin Ratio: 2.1 (ref 1.2–2.2)
Albumin: 4.4 g/dL (ref 3.6–4.6)
Alkaline Phosphatase: 71 IU/L (ref 44–121)
BUN/Creatinine Ratio: 18 (ref 12–28)
BUN: 21 mg/dL (ref 8–27)
Bilirubin Total: 0.4 mg/dL (ref 0.0–1.2)
CO2: 27 mmol/L (ref 20–29)
Calcium: 10.3 mg/dL (ref 8.7–10.3)
Chloride: 104 mmol/L (ref 96–106)
Creatinine, Ser: 1.18 mg/dL — ABNORMAL HIGH (ref 0.57–1.00)
Globulin, Total: 2.1 g/dL (ref 1.5–4.5)
Glucose: 99 mg/dL (ref 70–99)
Potassium: 4.3 mmol/L (ref 3.5–5.2)
Sodium: 143 mmol/L (ref 134–144)
Total Protein: 6.5 g/dL (ref 6.0–8.5)
eGFR: 45 mL/min/{1.73_m2} — ABNORMAL LOW (ref >59)

## 2021-01-31 LAB — LIPID PANEL
Chol/HDL Ratio: 3.2 ratio (ref 0.0–4.4)
Cholesterol, Total: 251 mg/dL — ABNORMAL HIGH (ref 100–199)
HDL: 79 mg/dL (ref >39)
LDL Chol Calc (NIH): 156 mg/dL — ABNORMAL HIGH (ref 0–99)
Triglycerides: 95 mg/dL (ref 0–149)
VLDL Cholesterol Cal: 16 mg/dL (ref 5–40)

## 2021-01-31 LAB — CBC WITH DIFFERENTIAL/PLATELET
Basophils Absolute: 0.1 10*3/uL (ref 0.0–0.2)
Basos: 1 %
EOS (ABSOLUTE): 0.1 10*3/uL (ref 0.0–0.4)
Eos: 1 %
Hematocrit: 43.8 % (ref 34.0–46.6)
Hemoglobin: 14.3 g/dL (ref 11.1–15.9)
Immature Grans (Abs): 0 10*3/uL (ref 0.0–0.1)
Immature Granulocytes: 0 %
Lymphocytes Absolute: 2.1 10*3/uL (ref 0.7–3.1)
Lymphs: 30 %
MCH: 28.8 pg (ref 26.6–33.0)
MCHC: 32.6 g/dL (ref 31.5–35.7)
MCV: 88 fL (ref 79–97)
Monocytes Absolute: 0.6 10*3/uL (ref 0.1–0.9)
Monocytes: 9 %
Neutrophils Absolute: 4.2 10*3/uL (ref 1.4–7.0)
Neutrophils: 59 %
Platelets: 278 10*3/uL (ref 150–450)
RBC: 4.96 x10E6/uL (ref 3.77–5.28)
RDW: 12.2 % (ref 11.7–15.4)
WBC: 7 10*3/uL (ref 3.4–10.8)

## 2021-02-05 DIAGNOSIS — M546 Pain in thoracic spine: Secondary | ICD-10-CM | POA: Diagnosis not present

## 2021-02-05 DIAGNOSIS — M955 Acquired deformity of pelvis: Secondary | ICD-10-CM | POA: Diagnosis not present

## 2021-02-05 DIAGNOSIS — M9902 Segmental and somatic dysfunction of thoracic region: Secondary | ICD-10-CM | POA: Diagnosis not present

## 2021-02-05 DIAGNOSIS — M9903 Segmental and somatic dysfunction of lumbar region: Secondary | ICD-10-CM | POA: Diagnosis not present

## 2021-02-05 DIAGNOSIS — M9905 Segmental and somatic dysfunction of pelvic region: Secondary | ICD-10-CM | POA: Diagnosis not present

## 2021-02-05 DIAGNOSIS — M5442 Lumbago with sciatica, left side: Secondary | ICD-10-CM | POA: Diagnosis not present

## 2021-02-10 ENCOUNTER — Ambulatory Visit
Admission: RE | Admit: 2021-02-10 | Discharge: 2021-02-10 | Disposition: A | Discharge status: Home / Self Care | Payer: Medicare Other | Source: Ambulatory Visit | Attending: Internal Medicine | Admitting: Internal Medicine

## 2021-02-10 ENCOUNTER — Other Ambulatory Visit: Payer: Self-pay

## 2021-02-10 DIAGNOSIS — Z1231 Encounter for screening mammogram for malignant neoplasm of breast: Secondary | ICD-10-CM | POA: Insufficient documentation

## 2021-03-12 ENCOUNTER — Other Ambulatory Visit: Payer: Self-pay | Admitting: Internal Medicine

## 2021-03-12 DIAGNOSIS — M5137 Other intervertebral disc degeneration, lumbosacral region: Secondary | ICD-10-CM | POA: Diagnosis not present

## 2021-03-12 DIAGNOSIS — M9902 Segmental and somatic dysfunction of thoracic region: Secondary | ICD-10-CM | POA: Diagnosis not present

## 2021-03-12 DIAGNOSIS — M9901 Segmental and somatic dysfunction of cervical region: Secondary | ICD-10-CM | POA: Diagnosis not present

## 2021-03-12 DIAGNOSIS — M531 Cervicobrachial syndrome: Secondary | ICD-10-CM | POA: Diagnosis not present

## 2021-03-12 DIAGNOSIS — M546 Pain in thoracic spine: Secondary | ICD-10-CM | POA: Diagnosis not present

## 2021-03-12 DIAGNOSIS — M9903 Segmental and somatic dysfunction of lumbar region: Secondary | ICD-10-CM | POA: Diagnosis not present

## 2021-03-12 NOTE — Telephone Encounter (Signed)
Requested Prescriptions  Pending Prescriptions Disp Refills   amLODipine (NORVASC) 10 MG tablet [Pharmacy Med Name: AMLODIPINE BESYLATE 10 MG TAB] 90 tablet 0    Sig: TAKE 1 TABLET BY MOUTH EVERY DAY     Cardiovascular:  Calcium Channel Blockers Passed - 03/12/2021  3:22 PM      Passed - Last BP in normal range    BP Readings from Last 1 Encounters:  01/30/21 132/80         Passed - Valid encounter within last 6 months    Recent Outpatient Visits          1 month ago Essential (primary) hypertension   [facility] Glean Hess, MD   2 months ago Esophageal dysphagia   [facility] Glean Hess, MD   4 months ago Essential (primary) hypertension   [facility] Glean Hess, MD   1 year ago Essential (primary) hypertension   [facility] Glean Hess, MD   1 year ago Essential (primary) hypertension   [facility] Glean Hess, MD      Future Appointments            In 4 months Army Melia Jesse Sans, MD [facility], [place]   In 10 months Army Melia Jesse Sans, MD [facility], [facility]

## 2021-03-20 ENCOUNTER — Ambulatory Visit
Admission: EM | Admit: 2021-03-20 | Discharge: 2021-03-20 | Disposition: A | Discharge status: Home / Self Care | Payer: Medicare Other | Attending: Emergency Medicine | Admitting: Emergency Medicine

## 2021-03-20 ENCOUNTER — Other Ambulatory Visit: Payer: Self-pay

## 2021-03-20 DIAGNOSIS — U071 COVID-19: Secondary | ICD-10-CM | POA: Diagnosis not present

## 2021-03-20 MED ORDER — NIRMATRELVIR/RITONAVIR (PAXLOVID) TABLET (RENAL DOSING): 2.0000 | ORAL_TABLET | Freq: Two times a day (BID) | ORAL | 0 refills | Status: AC

## 2021-03-20 NOTE — ED Provider Notes (Signed)
[facility]    CSN: [id number] Arrival date & time: 03/20/21  0908      History   Chief Complaint Chief Complaint  Patient presents with   Cough    HPI Clarivel Olimpia Tinch is a 88 y.o. female.   HPI  88 year old female here for evaluation of respiratory complaints.  Patient reports that she tested positive at home yesterday for COVID and she needs a letter because she canceled her upcoming travel to [place] and her cruise with her brother.  She needs a letter for her brothers travel insurance.  She reports that her symptoms may have started on Saturday and included mostly fatigue and she had a mild, nonproductive, infrequent cough.  She denies any other upper or lower respiratory symptoms and she denies GI symptoms.  Past Medical History:  Diagnosis Date   Acoustic neuroma ([place]) 01/20/2010   Formatting of this note might be different from the original. Hx of   Allergy    Arthritis of knee, left 01/20/2010   Bone spur 2019   right hip    Cataract 12/06/2019   surgery    Esophageal reflux 08/13/2014   Fibrocystic breast disease    Goiter, nontoxic, multinodular 05/23/2012   H/O jaundice    Hakim's syndrome ([place]) 11/05/2012   Hepatitis    as a child from drinking bad water   Hip pain, chronic, right    HLD (hyperlipidemia) 08/13/2014   Hyperparathyroidism, primary ([place]) 08/02/2013   Hypertension    Hyperthyroidism    Multiple thyroid nodules    Normal pressure hydrocephalus ([place]) 11/30/2013   Osteopenia 01/20/2010   Osteoporosis    Pneumonia 1960's   Thyroid disease    Ventricular shunt in place 08/13/2014   Vitamin D deficiency 08/13/2014    Patient Active Problem List   Diagnosis Date Noted   Slow transit constipation 01/23/2020   Encounter for long-term (current) use of NSAIDs 05/26/2019   Benign essential tremor 03/10/2018   Insomnia 07/12/2017   Trochanteric bursitis of right hip 01/12/2017   Pseudophakia of right eye 12/12/2015   History of  parathyroidectomy 07/10/2015   Hyperthyroidism, subclinical 04/22/2015   Hyperlipidemia, mixed 08/13/2014   Vitamin D deficiency 08/13/2014   Ventricular shunt in place 08/13/2014   Esophageal reflux 08/13/2014   Normal pressure hydrocephalus ([place]) 11/05/2012   Abnormal gait 08/26/2012   Goiter, nontoxic, multinodular 05/23/2012   History of acoustic neuroma 01/20/2010   Colon polyp 01/20/2010   Dependent edema 01/20/2010   Essential (primary) hypertension 01/20/2010   Arthritis of knee, left 01/20/2010   Osteopenia 01/20/2010    Past Surgical History:  Procedure Laterality Date   ABDOMINAL HYSTERECTOMY     ACOUSTIC NEUROMA RESECTION Right 1985   APPENDECTOMY     BRAIN SURGERY  09/08/2012   burr hole with biopsy; cranial tongs caliper/stereotactic frame right (Dr. Ralene Cork)   BREAST EXCISIONAL BIOPSY Left    benign   BREAST EXCISIONAL BIOPSY Left    benign   BREAST SURGERY Left    lumpectomy   CATARACT EXTRACTION Bilateral    right eye several years ago; left eye fall 2021   COLON SURGERY     EXCISION/RELEASE BURSA HIP Right 10/29/2017   Procedure: EXCISION/RELEASE BURSA HIP- IT BAND;  Surgeon: Leim Fabry, MD;  Location: [facility] ORS;  Service: Orthopedics;  Laterality: Right;   EYE SURGERY Right    cataract   FRACTURE SURGERY Right    shoulder   PARATHYROIDECTOMY  2017   partial  for hyperparathyroidism   SHOULDER ARTHROSCOPY W/ ACROMIAL REPAIR Right 78yrs ago   TUBAL LIGATION     VENTRICULOPERITONEAL SHUNT Right 09/08/2012   for NPH    OB History   No obstetric history on file.      Home Medications    Prior to Admission medications   Medication Sig Start Date End Date Taking? Authorizing Provider  amLODipine (NORVASC) 10 MG tablet TAKE 1 TABLET BY MOUTH EVERY DAY 03/12/21  Yes Glean Hess, MD  carboxymethylcellul-glycerin (OPTIVE) 0.5-0.9 % ophthalmic solution Place 1 drop into both eyes 2 (two) times daily.  11/18/11  Yes [provider]  cholecalciferol (VITAMIN D3) 25 MCG (1000 UNIT) tablet Take 1,000 Units by mouth daily.   Yes [provider]  furosemide (LASIX) 20 MG tablet TAKE 1 TABLET BY MOUTH EVERY DAY 01/22/21  Yes Glean Hess, MD  lisinopril (ZESTRIL) 10 MG tablet TAKE 1 TABLET BY MOUTH EVERY DAY 01/22/21  Yes Glean Hess, MD  methimazole (TAPAZOLE) 5 MG tablet TAKE 1 TABLET (5 MG TOTAL) BY MOUTH DAILY. 04/07/16  Yes Plonk, Gwyndolyn Saxon, MD  Multiple Vitamin (MULTIVITAMIN WITH MINERALS) TABS tablet Take 1 tablet by mouth daily. Centrum Silver for Women 50+   Yes [provider]  nirmatrelvir/ritonavir EUA, renal dosing, (PAXLOVID) 10 x 150 MG & 10 x 100MG  TABS Take 2 tablets by mouth 2 (two) times daily for 5 days. Patient GFR is 45.Take nirmatrelvir (150 mg) one tablet twice daily for 5 days and ritonavir (100 mg) one tablet twice daily for 5 days. 03/20/21 03/25/21 Yes Margarette Canada, NP  sennosides-docusate sodium (SENOKOT-S) 8.6-50 MG tablet Take 2 tablets by mouth 2 (two) times daily.  09/18/13  Yes [provider]  diphenhydrAMINE (BENADRYL) 25 MG tablet Take 25 mg by mouth at bedtime.    [provider]  naproxen sodium (ALEVE) 220 MG tablet Take 4 tablets by mouth daily.    [provider]  scopolamine (TRANSDERM-SCOP) 1 MG/3DAYS Place 1 patch (1.5 mg total) onto the skin every 3 (three) days. 01/30/21   Glean Hess, MD    Family History Family History  Problem Relation Age of Onset   Hypertension Mother    Mental illness Mother    Dementia Mother    Heart disease Father    Hypertension Father    Diabetes Sister    Stroke Brother    Heart disease Brother    Breast cancer Paternal Aunt     Social History Social History   Tobacco Use   Smoking status: Never   Smokeless tobacco: Never  Vaping Use   Vaping Use: Never used  Substance Use Topics   Alcohol use: Yes    Alcohol/week: 3.0 standard drinks    Types: 2 Glasses of wine, 1 Standard drinks  or equivalent per week    Comment: Occasionally   Drug use: Never     Allergies   Tylenol [acetaminophen]   Review of Systems Review of Systems  Constitutional:  Positive for fatigue. Negative for fever.  HENT:  Positive for voice change. Negative for congestion, ear pain, rhinorrhea and sore throat.   Respiratory:  Positive for cough. Negative for shortness of breath and wheezing.   Gastrointestinal:  Negative for diarrhea, nausea and vomiting.  Musculoskeletal:  Negative for arthralgias and myalgias.  Skin:  Negative for rash.  Hematological: Negative.   Psychiatric/Behavioral: Negative.      Physical Exam Triage Vital Signs ED Triage Vitals [03/20/21 0957]  Enc  Vitals Group     BP (!) 147/85     Pulse Rate 94     Resp 20     Temp 98.2 F (36.8 C)     Temp Source Oral     SpO2 97 %     Weight 131 lb (59.4 kg)     Height      Head Circumference      Peak Flow      Pain Score 0     Pain Loc      Pain Edu?      Excl. in [place]?    No data found.  Updated Vital Signs BP (!) 147/85 (BP Location: Left Arm)    Pulse 94    Temp 98.2 F (36.8 C) (Oral)    Resp 20    Wt 131 lb (59.4 kg)    SpO2 97%    BMI 24.75 kg/m   Visual Acuity Right Eye Distance:   Left Eye Distance:   Bilateral Distance:    Right Eye Near:   Left Eye Near:    Bilateral Near:     Physical Exam Vitals and nursing note reviewed.  Constitutional:      General: She is not in acute distress.    Appearance: Normal appearance. She is normal weight. She is not ill-appearing.  HENT:     Head: Normocephalic and atraumatic.     Right Ear: Tympanic membrane, ear canal and external ear normal. There is no impacted cerumen.     Left Ear: Tympanic membrane, ear canal and external ear normal. There is no impacted cerumen.     Nose: Congestion and rhinorrhea present.     Mouth/Throat:     Mouth: Mucous membranes are moist.     Pharynx: Oropharynx is clear. No oropharyngeal exudate or posterior  oropharyngeal erythema.  Eyes:     Extraocular Movements: Extraocular movements intact.     Pupils: Pupils are equal, round, and reactive to light.  Cardiovascular:     Rate and Rhythm: Normal rate and regular rhythm.     Pulses: Normal pulses.     Heart sounds: Normal heart sounds. No murmur heard.   No friction rub. No gallop.  Pulmonary:     Effort: Pulmonary effort is normal.     Breath sounds: Normal breath sounds. No wheezing, rhonchi or rales.  Musculoskeletal:     Cervical back: Normal range of motion and neck supple.  Lymphadenopathy:     Cervical: No cervical adenopathy.  Skin:    General: Skin is warm and dry.     Capillary Refill: Capillary refill takes less than 2 seconds.     Findings: No erythema or rash.  Neurological:     General: No focal deficit present.     Mental Status: She is alert and oriented to person, place, and time.  Psychiatric:        Mood and Affect: Mood normal.        Behavior: Behavior normal.        Thought Content: Thought content normal.        Judgment: Judgment normal.     UC Treatments / Results  Labs (all labs ordered are listed, but only abnormal results are displayed) Labs Reviewed - No data to display  EKG   Radiology No results found.  Procedures Procedures (including critical care time)  Medications Ordered in UC Medications - No data to display  Initial Impression / Assessment and Plan / UC Course  I have reviewed the triage vital signs and the nursing notes.  Pertinent labs & imaging results that were available during my care of the patient were reviewed by me and considered in my medical decision making (see chart for details).  Patient is a nontoxic-appearing 88 year old female here requesting a letter for her brother's travel insurance because she tested positive for COVID yesterday.  She states she developed symptoms over the weekend and she believes it was Saturday that consisted of fatigue and she is also had  a mild, infrequent, nonproductive cough.  No other symptoms vocalized by the patient.  Her physical exam reveals protegrin tympanic membranes bilaterally with normal light reflex and clear external auditory canals.  Nasal mucosa is mildly erythematous without significant edema.  There is scant clear discharge in both nares.  Posterior oropharynx has some mild clear postnasal drip without erythema or injection.  No cervical lymphadenopathy appreciated exam.  Cardiopulmonary exam feels clung sounds in all fields.  Patient is on day 4 of symptoms and she does qualify for oral antivirals.  Patient does have a CMP in epic from 01/30/2021 showing a GFR of 45.  I will prescribe the renal dosing of Paxlovid.  She is not having any additional symptoms.  I advised her to take Tylenol and ibuprofen for any fatigue she might have ER precautions reviewed with patient.   Final Clinical Impressions(s) / UC Diagnoses   Final diagnoses:  [id number]     Discharge Instructions      You will have to quarantine for 5 days from the start of your symptoms.  After 5 days you can break quarantine if your symptoms have improved and you have not had a fever for 24 hours without taking Tylenol or ibuprofen.  Use over-the-counter Tylenol and ibuprofen as needed for body aches and fever.  Take the Paxlovid twice daily for treatment of COVID-19..  If you develop any increased shortness of breath-especially at rest, you are unable to speak in full sentences, or is a late sign your lips are turning blue you need to go the ER for evaluation.      ED Prescriptions     Medication Sig Dispense Auth. Provider   nirmatrelvir/ritonavir EUA, renal dosing, (PAXLOVID) 10 x 150 MG & 10 x 100MG  TABS Take 2 tablets by mouth 2 (two) times daily for 5 days. Patient GFR is 45.Take nirmatrelvir (150 mg) one tablet twice daily for 5 days and ritonavir (100 mg) one tablet twice daily for 5 days. 20 tablet Margarette Canada, NP      PDMP not  reviewed this encounter.   Margarette Canada, NP 03/20/21 1212

## 2021-03-20 NOTE — ED Triage Notes (Signed)
Patient is here for "Cough"& "Fatigue". + COVID19 test at home "yesterday afternoon". Cancelled upcoming travel, needs letter. No fever.

## 2021-03-20 NOTE — Discharge Instructions (Signed)
You will have to quarantine for 5 days from the start of your symptoms.  After 5 days you can break quarantine if your symptoms have improved and you have not had a fever for 24 hours without taking Tylenol or ibuprofen.  Use over-the-counter Tylenol and ibuprofen as needed for body aches and fever.  Take the Paxlovid twice daily for treatment of COVID-19..  If you develop any increased shortness of breath-especially at rest, you are unable to speak in full sentences, or is a late sign your lips are turning blue you need to go the ER for evaluation.

## 2021-03-21 LAB — SARS CORONAVIRUS 2 (TAT 6-24 HRS): SARS Coronavirus 2: POSITIVE — AB

## 2021-04-07 DIAGNOSIS — M810 Age-related osteoporosis without current pathological fracture: Secondary | ICD-10-CM | POA: Diagnosis not present

## 2021-04-07 DIAGNOSIS — Z8781 Personal history of (healed) traumatic fracture: Secondary | ICD-10-CM | POA: Diagnosis not present

## 2021-04-07 DIAGNOSIS — Z9889 Other specified postprocedural states: Secondary | ICD-10-CM | POA: Diagnosis not present

## 2021-04-07 DIAGNOSIS — E042 Nontoxic multinodular goiter: Secondary | ICD-10-CM | POA: Diagnosis not present

## 2021-04-07 DIAGNOSIS — Z79899 Other long term (current) drug therapy: Secondary | ICD-10-CM | POA: Diagnosis not present

## 2021-04-07 DIAGNOSIS — E0590 Thyrotoxicosis, unspecified without thyrotoxic crisis or storm: Secondary | ICD-10-CM | POA: Diagnosis not present

## 2021-04-07 DIAGNOSIS — E0520 Thyrotoxicosis with toxic multinodular goiter without thyrotoxic crisis or storm: Secondary | ICD-10-CM | POA: Diagnosis not present

## 2021-04-07 DIAGNOSIS — K449 Diaphragmatic hernia without obstruction or gangrene: Secondary | ICD-10-CM | POA: Diagnosis not present

## 2021-04-07 DIAGNOSIS — M8580 Other specified disorders of bone density and structure, unspecified site: Secondary | ICD-10-CM | POA: Diagnosis not present

## 2021-04-07 DIAGNOSIS — E210 Primary hyperparathyroidism: Secondary | ICD-10-CM | POA: Diagnosis not present

## 2021-04-15 DIAGNOSIS — M9901 Segmental and somatic dysfunction of cervical region: Secondary | ICD-10-CM | POA: Diagnosis not present

## 2021-04-15 DIAGNOSIS — M531 Cervicobrachial syndrome: Secondary | ICD-10-CM | POA: Diagnosis not present

## 2021-04-15 DIAGNOSIS — M546 Pain in thoracic spine: Secondary | ICD-10-CM | POA: Diagnosis not present

## 2021-04-15 DIAGNOSIS — M9903 Segmental and somatic dysfunction of lumbar region: Secondary | ICD-10-CM | POA: Diagnosis not present

## 2021-04-15 DIAGNOSIS — M9902 Segmental and somatic dysfunction of thoracic region: Secondary | ICD-10-CM | POA: Diagnosis not present

## 2021-04-15 DIAGNOSIS — M5137 Other intervertebral disc degeneration, lumbosacral region: Secondary | ICD-10-CM | POA: Diagnosis not present

## 2021-04-20 ENCOUNTER — Other Ambulatory Visit: Payer: Self-pay | Admitting: Internal Medicine

## 2021-04-21 NOTE — Telephone Encounter (Signed)
Requested Prescriptions  Pending Prescriptions Disp Refills   lisinopril (ZESTRIL) 10 MG tablet [Pharmacy Med Name: LISINOPRIL 10 MG TABLET] 90 tablet 0    Sig: TAKE 1 TABLET BY MOUTH EVERY DAY     Cardiovascular:  ACE Inhibitors Failed - 04/20/2021  9:12 AM      Failed - Cr in normal range and within 180 days    Creatinine  Date Value Ref Range Status  07/23/2012 0.55 (L) 0.60 - 1.30 mg/dL Final   Creatinine, Ser  Date Value Ref Range Status  01/30/2021 1.18 (H) 0.57 - 1.00 mg/dL Final         Failed - Last BP in normal range    BP Readings from Last 1 Encounters:  03/20/21 (!) 147/85         Passed - K in normal range and within 180 days    Potassium  Date Value Ref Range Status  01/30/2021 4.3 3.5 - 5.2 mmol/L Final  07/23/2012 3.4 (L) 3.5 - 5.1 mmol/L Final         Passed - Patient is not pregnant      Passed - Valid encounter within last 6 months    Recent Outpatient Visits          2 months ago Essential (primary) hypertension   [facility] Glean Hess, MD   3 months ago Esophageal dysphagia   [facility] Glean Hess, MD   5 months ago Essential (primary) hypertension   [facility] Glean Hess, MD   1 year ago Essential (primary) hypertension   [facility] Glean Hess, MD   1 year ago Essential (primary) hypertension   Delia, Laura H, MD      Future Appointments            In 3 months Glean Hess, MD [facility], [place]   In 9 months Army Melia Jesse Sans, MD [facility]            furosemide (LASIX) 20 MG tablet [Pharmacy Med Name: FUROSEMIDE 20 MG TABLET] 90 tablet 0    Sig: TAKE 1 TABLET BY MOUTH EVERY DAY     Cardiovascular:  Diuretics - Loop Failed - 04/20/2021  9:12 AM      Failed - Cr in normal range and within 180 days    Creatinine  Date Value Ref Range Status  07/23/2012 0.55 (L) 0.60 - 1.30 mg/dL Final   Creatinine, Ser   Date Value Ref Range Status  01/30/2021 1.18 (H) 0.57 - 1.00 mg/dL Final         Failed - Mg Level in normal range and within 180 days    No results found for: MG       Failed - Last BP in normal range    BP Readings from Last 1 Encounters:  03/20/21 (!) 147/85         Passed - K in normal range and within 180 days    Potassium  Date Value Ref Range Status  01/30/2021 4.3 3.5 - 5.2 mmol/L Final  07/23/2012 3.4 (L) 3.5 - 5.1 mmol/L Final         Passed - Ca in normal range and within 180 days    Calcium  Date Value Ref Range Status  01/30/2021 10.3 8.7 - 10.3 mg/dL Final   Calcium, Total  Date Value Ref Range Status  07/23/2012 9.8 8.5 - 10.1 mg/dL Final  Passed - Na in normal range and within 180 days    Sodium  Date Value Ref Range Status  01/30/2021 143 134 - 144 mmol/L Final  07/23/2012 142 136 - 145 mmol/L Final         Passed - Cl in normal range and within 180 days    Chloride  Date Value Ref Range Status  01/30/2021 104 96 - 106 mmol/L Final  07/23/2012 110 (H) 98 - 107 mmol/L Final         Passed - Valid encounter within last 6 months    Recent Outpatient Visits          2 months ago Essential (primary) hypertension   [facility] Glean Hess, MD   3 months ago Esophageal dysphagia   [facility] Glean Hess, MD   5 months ago Essential (primary) hypertension   [facility] Glean Hess, MD   1 year ago Essential (primary) hypertension   [facility] Glean Hess, MD   1 year ago Essential (primary) hypertension   [facility] Glean Hess, MD      Future Appointments            In 3 months Army Melia Jesse Sans, MD [facility] [place]   In 9 months Army Melia, Jesse Sans, MD [facility], [facility]

## 2021-05-14 ENCOUNTER — Ambulatory Visit (INDEPENDENT_AMBULATORY_CARE_PROVIDER_SITE_OTHER): Payer: Medicare Other

## 2021-05-14 DIAGNOSIS — M531 Cervicobrachial syndrome: Secondary | ICD-10-CM | POA: Diagnosis not present

## 2021-05-14 DIAGNOSIS — Z0000 Encounter for general adult medical examination without abnormal findings: Secondary | ICD-10-CM | POA: Diagnosis not present

## 2021-05-14 DIAGNOSIS — M9903 Segmental and somatic dysfunction of lumbar region: Secondary | ICD-10-CM | POA: Diagnosis not present

## 2021-05-14 DIAGNOSIS — M9902 Segmental and somatic dysfunction of thoracic region: Secondary | ICD-10-CM | POA: Diagnosis not present

## 2021-05-14 DIAGNOSIS — M9901 Segmental and somatic dysfunction of cervical region: Secondary | ICD-10-CM | POA: Diagnosis not present

## 2021-05-14 DIAGNOSIS — M546 Pain in thoracic spine: Secondary | ICD-10-CM | POA: Diagnosis not present

## 2021-05-14 DIAGNOSIS — M5137 Other intervertebral disc degeneration, lumbosacral region: Secondary | ICD-10-CM | POA: Diagnosis not present

## 2021-05-14 NOTE — Patient Instructions (Signed)
Danielle Herrera , ?Thank you for taking time to come for your Medicare Wellness Visit. I appreciate your ongoing commitment to your health goals. Please review the following plan we discussed and let me know if I can assist you in the future.  ? ?Screening recommendations/referrals: ?Colonoscopy: no longer required ?Mammogram: done 02/10/21 ?Bone Density: done 04/07/21 ?Recommended yearly ophthalmology/optometry visit for glaucoma screening and checkup ?Recommended yearly dental visit for hygiene and checkup ? ?Vaccinations: ?Influenza vaccine: done 12/23/20 ?Pneumococcal vaccine: done 2018 ?Tdap vaccine: done 08/13/14 ?Shingles vaccine: done 07/03/16 & 08/30/16   ?Covid-19:done 03/15/19, 04/06/19, 11/27/19, 06/06/20 & 12/30/20 ? ?Conditions/risks identified: Keep up the great work! ? ?Next appointment: Follow up in one year for your annual wellness visit  ? ? ?Preventive Care 45 Years and Older, Female ?Preventive care refers to lifestyle choices and visits with your health care provider that can promote health and wellness. ?What does preventive care include? ?A yearly physical exam. This is also called an annual well check. ?Dental exams once or twice a year. ?Routine eye exams. Ask your health care provider how often you should have your eyes checked. ?Personal lifestyle choices, including: ?Daily care of your teeth and gums. ?Regular physical activity. ?Eating a healthy diet. ?Avoiding tobacco and drug use. ?Limiting alcohol use. ?Practicing safe sex. ?Taking low-dose aspirin every day. ?Taking vitamin and mineral supplements as recommended by your health care provider. ?What happens during an annual well check? ?The services and screenings done by your health care provider during your annual well check will depend on your age, overall health, lifestyle risk factors, and family history of disease. ?Counseling  ?Your health care provider may ask you questions about your: ?Alcohol use. ?Tobacco use. ?Drug use. ?Emotional  well-being. ?Home and relationship well-being. ?Sexual activity. ?Eating habits. ?History of falls. ?Memory and ability to understand (cognition). ?Work and work Statistician. ?Reproductive health. ?Screening  ?You may have the following tests or measurements: ?Height, weight, and BMI. ?Blood pressure. ?Lipid and cholesterol levels. These may be checked every 5 years, or more frequently if you are over 58 years old. ?Skin check. ?Lung cancer screening. You may have this screening every year starting at age 62 if you have a 30-pack-year history of smoking and currently smoke or have quit within the past 15 years. ?Fecal occult blood test (FOBT) of the stool. You may have this test every year starting at age 24. ?Flexible sigmoidoscopy or colonoscopy. You may have a sigmoidoscopy every 5 years or a colonoscopy every 10 years starting at age 14. ?Hepatitis C blood test. ?Hepatitis B blood test. ?Sexually transmitted disease (STD) testing. ?Diabetes screening. This is done by checking your blood sugar (glucose) after you have not eaten for a while (fasting). You may have this done every 1-3 years. ?Bone density scan. This is done to screen for osteoporosis. You may have this done starting at age 71. ?Mammogram. This may be done every 1-2 years. Talk to your health care provider about how often you should have regular mammograms. ?Talk with your health care provider about your test results, treatment options, and if necessary, the need for more tests. ?Vaccines  ?Your health care provider may recommend certain vaccines, such as: ?Influenza vaccine. This is recommended every year. ?Tetanus, diphtheria, and acellular pertussis (Tdap, Td) vaccine. You may need a Td booster every 10 years. ?Zoster vaccine. You may need this after age 68. ?Pneumococcal 13-valent conjugate (PCV13) vaccine. One dose is recommended after age 38. ?Pneumococcal polysaccharide (PPSV23) vaccine. One dose is  recommended after age 46. ?Talk to your  health care provider about which screenings and vaccines you need and how often you need them. ?This information is not intended to replace advice given to you by your health care provider. Make sure you discuss any questions you have with your health care provider. ?Document Released: 03/15/2015 Document Revised: 11/06/2015 Document Reviewed: 12/18/2014 ?Elsevier Interactive Patient Education ? 2017 [place]. ? ?Fall Prevention in the Home ?Falls can cause injuries. They can happen to people of all ages. There are many things you can do to make your home safe and to help prevent falls. ?What can I do on the outside of my home? ?Regularly fix the edges of walkways and driveways and fix any cracks. ?Remove anything that might make you trip as you walk through a door, such as a raised step or threshold. ?Trim any bushes or trees on the path to your home. ?Use bright outdoor lighting. ?Clear any walking paths of anything that might make someone trip, such as rocks or tools. ?Regularly check to see if handrails are loose or broken. Make sure that both sides of any steps have handrails. ?Any raised decks and porches should have guardrails on the edges. ?Have any leaves, snow, or ice cleared regularly. ?Use sand or salt on walking paths during winter. ?Clean up any spills in your garage right away. This includes oil or grease spills. ?What can I do in the bathroom? ?Use night lights. ?Install grab bars by the toilet and in the tub and shower. Do not use towel bars as grab bars. ?Use non-skid mats or decals in the tub or shower. ?If you need to sit down in the shower, use a plastic, non-slip stool. ?Keep the floor dry. Clean up any water that spills on the floor as soon as it happens. ?Remove soap buildup in the tub or shower regularly. ?Attach bath mats securely with double-sided non-slip rug tape. ?Do not have throw rugs and other things on the floor that can make you trip. ?What can I do in the bedroom? ?Use night  lights. ?Make sure that you have a light by your bed that is easy to reach. ?Do not use any sheets or blankets that are too big for your bed. They should not hang down onto the floor. ?Have a firm chair that has side arms. You can use this for support while you get dressed. ?Do not have throw rugs and other things on the floor that can make you trip. ?What can I do in the kitchen? ?Clean up any spills right away. ?Avoid walking on wet floors. ?Keep items that you use a lot in easy-to-reach places. ?If you need to reach something above you, use a strong step stool that has a grab bar. ?Keep electrical cords out of the way. ?Do not use floor polish or wax that makes floors slippery. If you must use wax, use non-skid floor wax. ?Do not have throw rugs and other things on the floor that can make you trip. ?What can I do with my stairs? ?Do not leave any items on the stairs. ?Make sure that there are handrails on both sides of the stairs and use them. Fix handrails that are broken or loose. Make sure that handrails are as long as the stairways. ?Check any carpeting to make sure that it is firmly attached to the stairs. Fix any carpet that is loose or worn. ?Avoid having throw rugs at the top or bottom of the stairs. If  you do have throw rugs, attach them to the floor with carpet tape. ?Make sure that you have a light switch at the top of the stairs and the bottom of the stairs. If you do not have them, ask someone to add them for you. ?What else can I do to help prevent falls? ?Wear shoes that: ?Do not have high heels. ?Have rubber bottoms. ?Are comfortable and fit you well. ?Are closed at the toe. Do not wear sandals. ?If you use a stepladder: ?Make sure that it is fully opened. Do not climb a closed stepladder. ?Make sure that both sides of the stepladder are locked into place. ?Ask someone to hold it for you, if possible. ?Clearly mark and make sure that you can see: ?Any grab bars or handrails. ?First and last  steps. ?Where the edge of each step is. ?Use tools that help you move around (mobility aids) if they are needed. These include: ?Canes. ?Walkers. ?Scooters. ?Crutches. ?Turn on the lights when you go into a dark area. R

## 2021-05-14 NOTE — Progress Notes (Signed)
? ?Subjective:  ? Danielle Herrera is a 88 y.o. female who presents for Medicare Annual (Subsequent) preventive examination. ? ?Virtual Visit via Telephone Note ? ?I connected with  Danielle Herrera on 05/14/21 at 10:00 AM EDT by telephone and verified that I am speaking with the correct person using two identifiers. ? ?Location: ?Patient: home ?Provider: [facility] ?Persons participating in the virtual visit: patient/Nurse Health Advisor ?  ?I discussed the limitations, risks, security and privacy concerns of performing an evaluation and management service by telephone and the availability of in person appointments. The patient expressed understanding and agreed to proceed. ? ?Interactive audio and video telecommunications were attempted between this nurse and patient, however failed, due to patient having technical difficulties OR patient did not have access to video capability.  We continued and completed visit with audio only. ? ?Some vital signs may be absent or patient reported.  ? ?Clemetine Marker, LPN ? ? ?Review of Systems    ? ?Cardiac Risk Factors include: advanced age (>85mn, >>31women);hypertension ? ?   ?Objective:  ?  ?Today's Vitals  ? 05/14/21 1012  ?PainSc: 2   ? ?There is no height or weight on file to calculate BMI. ? ?Advanced Directives 05/14/2021 01/30/2021 05/13/2020 05/10/2019 05/04/2018 11/01/2017 11/01/2017  ?Does Patient Have a Medical Advance Directive? Yes No;Yes Yes Yes Yes Yes Yes  ?Type of AParamedicof AIrontonLiving will - [place]Living will [place] Stewart[place] will Living will;Healthcare Power of Attorney - -  ?Does patient want to make changes to medical advance directive? - No - Patient declined - - - - -  ?Copy of [place] Chart? Yes - validated most recent copy scanned in chart (See row information) Yes - validated most recent copy scanned in chart (See row information) Yes - validated most recent copy  scanned in chart (See row information) Yes - validated most recent copy scanned in chart (See row information) Yes - validated most recent copy scanned in chart (See row information) - -  ?Would patient like information on creating a medical advance directive? - No - Guardian declined - - - - -  ? ? ?Current Medications (verified) ?Outpatient Encounter Medications as of 05/14/2021  ?Medication Sig  ? amLODipine (NORVASC) 10 MG tablet TAKE 1 TABLET BY MOUTH EVERY DAY  ? carboxymethylcellul-glycerin (OPTIVE) 0.5-0.9 % ophthalmic solution Place 1 drop into both eyes 2 (two) times daily.   ? cholecalciferol (VITAMIN D3) 25 MCG (1000 UNIT) tablet Take 1,000 Units by mouth daily.  ? diphenhydrAMINE (BENADRYL) 25 MG tablet Take 25 mg by mouth at bedtime.  ? furosemide (LASIX) 20 MG tablet TAKE 1 TABLET BY MOUTH EVERY DAY  ? lisinopril (ZESTRIL) 10 MG tablet TAKE 1 TABLET BY MOUTH EVERY DAY  ? methimazole (TAPAZOLE) 5 MG tablet TAKE 1 TABLET (5 MG TOTAL) BY MOUTH DAILY.  ? Multiple Vitamin (MULTIVITAMIN WITH MINERALS) TABS tablet Take 1 tablet by mouth daily. Centrum Silver for Women 50+  ? naproxen sodium (ALEVE) 220 MG tablet Take 4 tablets by mouth daily.  ? sennosides-docusate sodium (SENOKOT-S) 8.6-50 MG tablet Take 2 tablets by mouth 2 (two) times daily.   ? [DISCONTINUED] scopolamine (TRANSDERM-SCOP) 1 MG/3DAYS Place 1 patch (1.5 mg total) onto the skin every 3 (three) days.  ? ?No facility-administered encounter medications on file as of 05/14/2021.  ? ? ?Allergies (verified) ?Tylenol [acetaminophen]  ? ?History: ?Past Medical History:  ?Diagnosis Date  ? 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 Formatting of this note might be different from the original. Hx of  ? Allergy   ? Arthritis of knee, left 01/20/2010  ? Bone spur 2019  ? right hip   ? Cataract 12/06/2019  ? surgery   ? Esophageal reflux 08/13/2014  ? Fibrocystic breast disease   ? Goiter, nontoxic, multinodular 05/23/2012  ? H/O jaundice   ? 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 Hepatitis   ? as a child from drinking bad water  ? Hip pain, chronic, right   ? HLD (hyperlipidemia) 08/13/2014  ? Hyperparathyroidism, primary ([place]) 08/02/2013  ? Hypertension   ? Hyperthyroidism   ? Multiple thyroid nodules   ? Normal pressure hydrocephalus ([place]) 11/30/2013  ? Osteopenia 01/20/2010  ? Osteoporosis   ? Pneumonia 1960's  ? Thyroid disease   ? Ventricular shunt in place 08/13/2014  ? Vitamin D deficiency 08/13/2014  ? ?Past Surgical History:  ?Procedure Laterality Date  ? ABDOMINAL HYSTERECTOMY    ? ACOUSTIC NEUROMA RESECTION Right 1985  ? APPENDECTOMY    ? BRAIN SURGERY  09/08/2012  ? burr hole with biopsy; cranial tongs caliper/stereotactic frame right (Dr. Ralene Cork)  ? BREAST EXCISIONAL BIOPSY Left   ? benign  ? BREAST EXCISIONAL BIOPSY Left   ? benign  ? BREAST SURGERY Left   ? lumpectomy  ? CATARACT EXTRACTION Bilateral   ? right eye several years ago; left eye fall 2021  ? COLON SURGERY    ? EXCISION/RELEASE BURSA HIP Right 10/29/2017  ? Procedure: EXCISION/RELEASE BURSA HIP- IT BAND;  Surgeon: Leim Fabry, MD;  Location: [facility] ORS;  Service: Orthopedics;  Laterality: Right;  ? EYE SURGERY Right   ? cataract  ? FRACTURE SURGERY Right   ? shoulder  ? PARATHYROIDECTOMY  2017  ? partial for hyperparathyroidism  ? SHOULDER ARTHROSCOPY W/ ACROMIAL REPAIR Right 71yr ago  ? TUBAL LIGATION    ? VENTRICULOPERITONEAL SHUNT Right 09/08/2012  ? for NPH  ? ?Family History  ?Problem Relation Age of Onset  ? Hypertension Mother   ? Mental illness Mother   ? Dementia Mother   ? Heart disease Father   ? Hypertension Father   ? Diabetes Sister   ? Stroke Brother   ? Heart disease Brother   ? Breast cancer Paternal Aunt   ? ?Social History  ? ?Socioeconomic History  ? Marital status: Widowed  ?  Spouse name: Not on file  ? Number of children: 0  ? Years of education: Not on file  ? Highest education level: 12th grade  ?Occupational History  ? Occupation: retired  ?Tobacco Use  ?  Smoking status: Never  ? Smokeless tobacco: Never  ?Vaping Use  ? Vaping Use: Never used  ?Substance and Sexual Activity  ? Alcohol use: Yes  ?  Alcohol/week: 3.0 standard drinks  ?  Types: 3 Glasses of wine per week  ?  Comment: Occasionally  ? Drug use: Never  ? Sexual activity: Not Currently  ?Other Topics Concern  ? Not on file  ?Social History Narrative  ? Pt lives alone  ? ?Social Determinants of Health  ? ?Financial Resource Strain: Low Risk   ? Difficulty of Paying Living Expenses: Not hard at all  ?Food Insecurity: No Food Insecurity  ? Worried About RCharity fundraiserin the Last Year: Never true  ? Ran Out of Food in the Last Year: Never true  ?Transportation Needs: No Transportation Needs  ? Lack of  Transportation (Medical): No  ? Lack of Transportation (Non-Medical): No  ?Physical Activity: Insufficiently Active  ? Days of Exercise per Week: 7 days  ? Minutes of Exercise per Session: 20 min  ?Stress: No Stress Concern Present  ? Feeling of Stress : Not at all  ?Social Connections: Moderately Isolated  ? Frequency of Communication with Friends and Family: More than three times a week  ? Frequency of Social Gatherings with Friends and Family: Twice a week  ? Attends Religious Services: More than 4 times per year  ? Active Member of Clubs or Organizations: No  ? Attends Archivist Meetings: Never  ? Marital Status: Widowed  ? ? ?Tobacco Counseling ?Counseling given: Not Answered ? ? ?Clinical Intake: ? ?Pre-visit preparation completed: Yes ? ?Pain : 0-10 ?Pain Score: 2  ?Pain Type: Chronic pain ?Pain Location: Hip ?Pain Orientation: Right ?Pain Descriptors / Indicators: Aching, Sore ?Pain Onset: More than a month ago ?Pain Frequency: Intermittent ? ?  ? ?Nutritional Risks: None ?Diabetes: No ? ?How often do you need to have someone help you when you read instructions, pamphlets, or other written materials from your doctor or pharmacy?: 1 - Never ? ? ? ?Interpreter Needed?: No ? ?Information  entered by :: Clemetine Marker LPN ? ? ?Activities of Daily Living ?In your present state of health, do you have any difficulty performing the following activities: 05/14/2021 01/30/2021  ?Hearing? Y Y  ?Comment deaf in

## 2021-06-08 ENCOUNTER — Other Ambulatory Visit: Payer: Self-pay | Admitting: Internal Medicine

## 2021-06-09 NOTE — Telephone Encounter (Signed)
Requested Prescriptions  ?Pending Prescriptions Disp Refills  ?? amLODipine (NORVASC) 10 MG tablet [Pharmacy Med Name: AMLODIPINE BESYLATE 10 MG TAB] 90 tablet 0  ?  Sig: TAKE 1 TABLET BY MOUTH EVERY DAY  ?  ? Cardiovascular: Calcium Channel Blockers 2 Failed - 06/08/2021  9:12 AM  ?  ?  Failed - Last BP in normal range  ?  BP Readings from Last 1 Encounters:  ?03/20/21 (!) 147/85  ?   ?  ?  Passed - Last Heart Rate in normal range  ?  Pulse Readings from Last 1 Encounters:  ?03/20/21 94  ?   ?  ?  Passed - Valid encounter within last 6 months  ?  Recent Outpatient Visits   ?      ? 4 months ago Essential (primary) hypertension  ? [facility] Glean Hess, MD  ? 5 months ago Esophageal dysphagia  ? [facility] Glean [facility], [facility]  ? 7 months ago Essential (primary) hypertension  ? [facility] Glean Hess, MD  ? 1 year ago Essential (primary) hypertension  ? [facility] Glean Hess, MD  ? 2 years ago Essential (primary) hypertension  ? [facility] Glean Hess, MD  ?  ?  ?Future Appointments   ?        ? In 1 month Army Melia Jesse Sans, MD [facility] [place]  ? In 7 months Army Melia Jesse Sans, MD [facility] [place]  ?  ? ?  ?  ?  ? ?

## 2021-06-11 DIAGNOSIS — M9902 Segmental and somatic dysfunction of thoracic region: Secondary | ICD-10-CM | POA: Diagnosis not present

## 2021-06-11 DIAGNOSIS — M531 Cervicobrachial syndrome: Secondary | ICD-10-CM | POA: Diagnosis not present

## 2021-06-11 DIAGNOSIS — M9901 Segmental and somatic dysfunction of cervical region: Secondary | ICD-10-CM | POA: Diagnosis not present

## 2021-06-11 DIAGNOSIS — M9903 Segmental and somatic dysfunction of lumbar region: Secondary | ICD-10-CM | POA: Diagnosis not present

## 2021-06-11 DIAGNOSIS — M5137 Other intervertebral disc degeneration, lumbosacral region: Secondary | ICD-10-CM | POA: Diagnosis not present

## 2021-06-11 DIAGNOSIS — M546 Pain in thoracic spine: Secondary | ICD-10-CM | POA: Diagnosis not present

## 2021-06-26 DIAGNOSIS — H903 Sensorineural hearing loss, bilateral: Secondary | ICD-10-CM | POA: Diagnosis not present

## 2021-06-26 DIAGNOSIS — H90A21 Sensorineural hearing loss, unilateral, right ear, with restricted hearing on the contralateral side: Secondary | ICD-10-CM | POA: Diagnosis not present

## 2021-06-26 DIAGNOSIS — H6123 Impacted cerumen, bilateral: Secondary | ICD-10-CM | POA: Diagnosis not present

## 2021-06-26 DIAGNOSIS — E041 Nontoxic single thyroid nodule: Secondary | ICD-10-CM | POA: Diagnosis not present

## 2021-07-09 DIAGNOSIS — M9902 Segmental and somatic dysfunction of thoracic region: Secondary | ICD-10-CM | POA: Diagnosis not present

## 2021-07-09 DIAGNOSIS — M531 Cervicobrachial syndrome: Secondary | ICD-10-CM | POA: Diagnosis not present

## 2021-07-09 DIAGNOSIS — M9903 Segmental and somatic dysfunction of lumbar region: Secondary | ICD-10-CM | POA: Diagnosis not present

## 2021-07-09 DIAGNOSIS — M5137 Other intervertebral disc degeneration, lumbosacral region: Secondary | ICD-10-CM | POA: Diagnosis not present

## 2021-07-09 DIAGNOSIS — M9901 Segmental and somatic dysfunction of cervical region: Secondary | ICD-10-CM | POA: Diagnosis not present

## 2021-07-09 DIAGNOSIS — M546 Pain in thoracic spine: Secondary | ICD-10-CM | POA: Diagnosis not present

## 2021-07-17 ENCOUNTER — Other Ambulatory Visit: Payer: Self-pay | Admitting: Internal Medicine

## 2021-07-17 NOTE — Telephone Encounter (Signed)
Requested Prescriptions  Pending Prescriptions Disp Refills  . lisinopril (ZESTRIL) 10 MG tablet [Pharmacy Med Name: LISINOPRIL 10 MG TABLET] 90 tablet 0    Sig: TAKE 1 TABLET BY MOUTH EVERY DAY     Cardiovascular:  ACE Inhibitors Failed - 07/17/2021  2:19 AM      Failed - Cr in normal range and within 180 days    Creatinine  Date Value Ref Range Status  07/23/2012 0.55 (L) 0.60 - 1.30 mg/dL Final   Creatinine, Ser  Date Value Ref Range Status  01/30/2021 1.18 (H) 0.57 - 1.00 mg/dL Final         Failed - Last BP in normal range    BP Readings from Last 1 Encounters:  03/20/21 (!) 147/85         Passed - K in normal range and within 180 days    Potassium  Date Value Ref Range Status  01/30/2021 4.3 3.5 - 5.2 mmol/L Final  07/23/2012 3.4 (L) 3.5 - 5.1 mmol/L Final         Passed - Patient is not pregnant      Passed - Valid encounter within last 6 months    Recent Outpatient Visits          5 months ago Essential (primary) hypertension   [facility] Glean Hess, MD   6 months ago Esophageal dysphagia   [facility] Glean Hess, MD   8 months ago Essential (primary) hypertension   [facility] Glean Hess, MD   1 year ago Essential (primary) hypertension   [facility] Glean Hess, MD   2 years ago Essential (primary) hypertension   [place], Laura H, MD      Future Appointments            In 2 weeks Glean Hess, MD [facility], [place]   In 6 months Glean Hess, MD [facility]           . furosemide (LASIX) 20 MG tablet [Pharmacy Med Name: FUROSEMIDE 20 MG TABLET] 90 tablet 0    Sig: TAKE 1 TABLET BY MOUTH EVERY DAY     Cardiovascular:  Diuretics - Loop Failed - 07/17/2021  2:19 AM      Failed - Cr in normal range and within 180 days    Creatinine  Date Value Ref Range Status  07/23/2012 0.55 (L) 0.60 - 1.30 mg/dL Final   Creatinine, Ser   Date Value Ref Range Status  01/30/2021 1.18 (H) 0.57 - 1.00 mg/dL Final         Failed - Mg Level in normal range and within 180 days    No results found for: MG       Failed - Last BP in normal range    BP Readings from Last 1 Encounters:  03/20/21 (!) 147/85         Passed - K in normal range and within 180 days    Potassium  Date Value Ref Range Status  01/30/2021 4.3 3.5 - 5.2 mmol/L Final  07/23/2012 3.4 (L) 3.5 - 5.1 mmol/L Final         Passed - Ca in normal range and within 180 days    Calcium  Date Value Ref Range Status  01/30/2021 10.3 8.7 - 10.3 mg/dL Final   Calcium, Total  Date Value Ref Range Status  07/23/2012 9.8 8.5 - 10.1 mg/dL Final  Passed - Na in normal range and within 180 days    Sodium  Date Value Ref Range Status  01/30/2021 143 134 - 144 mmol/L Final  07/23/2012 142 136 - 145 mmol/L Final         Passed - Cl in normal range and within 180 days    Chloride  Date Value Ref Range Status  01/30/2021 104 96 - 106 mmol/L Final  07/23/2012 110 (H) 98 - 107 mmol/L Final         Passed - Valid encounter within last 6 months    Recent Outpatient Visits          5 months ago Essential (primary) hypertension   [place], [place]   6 months ago Esophageal dysphagia   [facility] Glean Hess, MD   8 months ago Essential (primary) hypertension   [facility] Glean Hess, MD   1 year ago Essential (primary) hypertension   [facility] Glean Hess, MD   2 years ago Essential (primary) hypertension   [facility] Glean Hess, MD      Future Appointments            In 2 weeks Army Melia Jesse Sans, MD [facility], [place]   In 6 months Army Melia, Jesse Sans, MD [facility], [facility]

## 2021-07-21 DIAGNOSIS — H353132 Nonexudative age-related macular degeneration, bilateral, intermediate dry stage: Secondary | ICD-10-CM | POA: Diagnosis not present

## 2021-07-21 DIAGNOSIS — H0288A Meibomian gland dysfunction right eye, upper and lower eyelids: Secondary | ICD-10-CM | POA: Diagnosis not present

## 2021-07-21 DIAGNOSIS — H0288B Meibomian gland dysfunction left eye, upper and lower eyelids: Secondary | ICD-10-CM | POA: Diagnosis not present

## 2021-07-31 ENCOUNTER — Encounter: Payer: Self-pay | Admitting: Internal Medicine

## 2021-07-31 ENCOUNTER — Ambulatory Visit (INDEPENDENT_AMBULATORY_CARE_PROVIDER_SITE_OTHER): Payer: Medicare Other | Admitting: Internal Medicine

## 2021-07-31 VITALS — BP 128/70 | HR 93 | Ht 61.0 in | Wt 129.0 lb

## 2021-07-31 DIAGNOSIS — E892 Postprocedural hypoparathyroidism: Secondary | ICD-10-CM | POA: Diagnosis not present

## 2021-07-31 DIAGNOSIS — E042 Nontoxic multinodular goiter: Secondary | ICD-10-CM

## 2021-07-31 DIAGNOSIS — I10 Essential (primary) hypertension: Secondary | ICD-10-CM

## 2021-07-31 NOTE — Progress Notes (Signed)
Date:  07/31/2021   Name:  Danielle Herrera   DOB:  11-Sep-1933   MRN:  [id number]   Chief Complaint: Hypertension  Hypertension This is a chronic problem. The current episode started more than 1 year ago. The problem is unchanged. The problem is controlled. Pertinent negatives include no chest pain, headaches, palpitations or shortness of breath. Past treatments include calcium channel blockers, ACE inhibitors and diuretics. There is no history of kidney disease, CAD/MI or CVA.   Lab Results  Component Value Date   NA 143 01/30/2021   K 4.3 01/30/2021   CO2 27 01/30/2021   GLUCOSE 99 01/30/2021   BUN 21 01/30/2021   CREATININE 1.18 (H) 01/30/2021   CALCIUM 10.3 01/30/2021   EGFR 45 (L) 01/30/2021   GFRNONAA 52 (L) 01/23/2020   Lab Results  Component Value Date   CHOL 251 (H) 01/30/2021   HDL 79 01/30/2021   LDLCALC 156 (H) 01/30/2021   TRIG 95 01/30/2021   CHOLHDL 3.2 01/30/2021   Lab Results  Component Value Date   TSH 0.64 04/03/2020   No results found for: HGBA1C Lab Results  Component Value Date   WBC 7.0 01/30/2021   HGB 14.3 01/30/2021   HCT 43.8 01/30/2021   MCV 88 01/30/2021   PLT 278 01/30/2021   Lab Results  Component Value Date   ALT 13 01/30/2021   AST 19 01/30/2021   ALKPHOS 71 01/30/2021   BILITOT 0.4 01/30/2021   No results found for: 25OHVITD2, 25OHVITD3, VD25OH   Review of Systems  Constitutional:  Negative for chills, fever and unexpected weight change.  Respiratory:  Negative for chest tightness and shortness of breath.   Cardiovascular:  Negative for chest pain and palpitations.  Musculoskeletal:  Positive for gait problem (uses walker).  Neurological:  Negative for dizziness and headaches.  Psychiatric/Behavioral:  Negative for dysphoric mood and sleep disturbance. The patient is not nervous/anxious.    Patient Active Problem List   Diagnosis Date Noted   Slow transit constipation 01/23/2020   Encounter for long-term (current)  use of NSAIDs 05/26/2019   Benign essential tremor 03/10/2018   Insomnia 07/12/2017   Trochanteric bursitis of right hip 01/12/2017   Pseudophakia of right eye 12/12/2015   History of parathyroidectomy 07/10/2015   Hyperthyroidism, subclinical 04/22/2015   Hyperlipidemia, mixed 08/13/2014   Vitamin D deficiency 08/13/2014   Ventricular shunt in place 08/13/2014   Esophageal reflux 08/13/2014   Normal pressure hydrocephalus ([place]) 11/05/2012   Abnormal gait 08/26/2012   Goiter, nontoxic, multinodular 05/23/2012   History of acoustic neuroma 01/20/2010   Colon polyp 01/20/2010   Dependent edema 01/20/2010   Essential (primary) hypertension 01/20/2010   Arthritis of knee, left 01/20/2010   Osteopenia 01/20/2010    Allergies  Allergen Reactions   Tylenol [Acetaminophen] Other (See Comments)    Elevated LFTs    Past Surgical History:  Procedure Laterality Date   ABDOMINAL HYSTERECTOMY     ACOUSTIC NEUROMA RESECTION Right 1985   APPENDECTOMY     BRAIN SURGERY  09/08/2012   burr hole with biopsy; cranial tongs caliper/stereotactic frame right (Dr. Ralene Cork)   BREAST EXCISIONAL BIOPSY Left    benign   BREAST EXCISIONAL BIOPSY Left    benign   BREAST SURGERY Left    lumpectomy   CATARACT EXTRACTION Bilateral    right eye several years ago; left eye fall 2021   COLON SURGERY     EXCISION/RELEASE BURSA HIP Right 10/29/2017  Procedure: EXCISION/RELEASE BURSA HIP- IT BAND;  Surgeon: Leim Fabry, MD;  Location: [facility] ORS;  Service: Orthopedics;  Laterality: Right;   EYE SURGERY Right    cataract   FRACTURE SURGERY Right    shoulder   PARATHYROIDECTOMY  2017   partial for hyperparathyroidism   SHOULDER ARTHROSCOPY W/ ACROMIAL REPAIR Right 85yrs ago   TUBAL LIGATION     VENTRICULOPERITONEAL SHUNT Right 09/08/2012   for NPH    Social History   Tobacco Use   Smoking status: Never   Smokeless tobacco: Never  Vaping Use   Vaping Use: Never used  Substance Use  Topics   Alcohol use: Yes    Alcohol/week: 3.0 standard drinks    Types: 3 Glasses of wine per week    Comment: Occasionally   Drug use: Never     Medication list has been reviewed and updated.  Current Meds  Medication Sig   amLODipine (NORVASC) 10 MG tablet TAKE 1 TABLET BY MOUTH EVERY DAY   carboxymethylcellul-glycerin (OPTIVE) 0.5-0.9 % ophthalmic solution Place 1 drop into both eyes 2 (two) times daily.    cholecalciferol (VITAMIN D3) 25 MCG (1000 UNIT) tablet Take 1,000 Units by mouth daily.   diphenhydrAMINE (BENADRYL) 25 MG tablet Take 25 mg by mouth at bedtime.   furosemide (LASIX) 20 MG tablet TAKE 1 TABLET BY MOUTH EVERY DAY   lisinopril (ZESTRIL) 10 MG tablet TAKE 1 TABLET BY MOUTH EVERY DAY   methimazole (TAPAZOLE) 5 MG tablet Take 5 mg by mouth 3 (three) times daily. Dr. Kathlen Mody- [facility].   Multiple Vitamin (MULTIVITAMIN WITH MINERALS) TABS tablet Take 1 tablet by mouth daily. Centrum Silver for Women 50+   naproxen sodium (ALEVE) 220 MG tablet Take 4 tablets by mouth daily.   sennosides-docusate sodium (SENOKOT-S) 8.6-50 MG tablet Take 2 tablets by mouth 2 (two) times daily.    [DISCONTINUED] methimazole (TAPAZOLE) 5 MG tablet TAKE 1 TABLET (5 MG TOTAL) BY MOUTH DAILY.       07/31/2021    9:17 AM 01/30/2021    9:01 AM 01/06/2021    3:17 PM 10/28/2020    2:58 PM  GAD 7 : Generalized Anxiety Score  Nervous, Anxious, on Edge 0 0 0 0  Control/stop worrying 0 0 0 0  Worry too much - different things 0 0 0 0  Trouble relaxing 0 0 0 0  Restless 0 0 0 0  Easily annoyed or irritable 0 0 0 0  Afraid - awful might happen 0 0 0 0  Total GAD 7 Score 0 0 0 0  Anxiety Difficulty Not difficult at all Not difficult at all Not difficult at all        07/31/2021    9:17 AM  Depression screen PHQ 2/9  Decreased Interest 0  Down, Depressed, Hopeless 0  PHQ - 2 Score 0  Altered sleeping 0  Tired, decreased energy 0  Change in appetite 1  Feeling bad or failure about yourself   0  Trouble concentrating 0  Moving slowly or fidgety/restless 0  Suicidal thoughts 0  PHQ-9 Score 1  Difficult doing work/chores Not difficult at all    BP Readings from Last 3 Encounters:  07/31/21 128/70  03/20/21 (!) 147/85  01/30/21 132/80    Physical Exam Constitutional:      Appearance: Normal appearance.  Neck:     Comments: Left sided thyroid nodule 2 cm mobile nontender Cardiovascular:     Rate and Rhythm: Normal rate and regular  rhythm.     Pulses: Normal pulses.     Heart sounds: No murmur heard. Pulmonary:     Effort: Pulmonary effort is normal.     Breath sounds: No wheezing or rhonchi.  Musculoskeletal:     Cervical back: Normal range of motion.     Right lower leg: No edema.     Left lower leg: No edema.  Skin:    Capillary Refill: Capillary refill takes less than 2 seconds.  Neurological:     General: No focal deficit present.     Mental Status: She is alert.  Psychiatric:        Mood and Affect: Mood normal.        Behavior: Behavior normal.    Wt Readings from Last 3 Encounters:  07/31/21 129 lb (58.5 kg)  03/20/21 131 lb (59.4 kg)  01/30/21 133 lb 9.6 oz (60.6 kg)    BP 128/70   Pulse 93   Ht $R'5\' 1"'ox$  (1.549 m)   Wt 129 lb (58.5 kg)   SpO2 96%   BMI 24.37 kg/m   Assessment and Plan: 1. Essential (primary) hypertension Clinically stable exam with well controlled BP. Tolerating medications without side effects at this time. Pt to continue current regimen and low sodium diet; benefits of regular exercise as able discussed.  2. S/P parathyroidectomy ([place]) Followed by Endocrinology Recent labs were normal.  3. Goiter, nontoxic, multinodular Followed by Endocrinology   Partially dictated using Dragon software. Any errors are unintentional.  Halina Maidens, MD [facility]  07/31/2021

## 2021-08-06 DIAGNOSIS — M9902 Segmental and somatic dysfunction of thoracic region: Secondary | ICD-10-CM | POA: Diagnosis not present

## 2021-08-06 DIAGNOSIS — M9901 Segmental and somatic dysfunction of cervical region: Secondary | ICD-10-CM | POA: Diagnosis not present

## 2021-08-06 DIAGNOSIS — M5137 Other intervertebral disc degeneration, lumbosacral region: Secondary | ICD-10-CM | POA: Diagnosis not present

## 2021-08-06 DIAGNOSIS — M9903 Segmental and somatic dysfunction of lumbar region: Secondary | ICD-10-CM | POA: Diagnosis not present

## 2021-08-06 DIAGNOSIS — M546 Pain in thoracic spine: Secondary | ICD-10-CM | POA: Diagnosis not present

## 2021-08-06 DIAGNOSIS — M531 Cervicobrachial syndrome: Secondary | ICD-10-CM | POA: Diagnosis not present

## 2021-09-05 ENCOUNTER — Other Ambulatory Visit: Payer: Self-pay | Admitting: Internal Medicine

## 2021-09-05 NOTE — Telephone Encounter (Signed)
Requested Prescriptions  Pending Prescriptions Disp Refills  . amLODipine (NORVASC) 10 MG tablet [Pharmacy Med Name: AMLODIPINE BESYLATE 10 MG TAB] 90 tablet 0    Sig: TAKE 1 TABLET BY MOUTH EVERY DAY     Cardiovascular: Calcium Channel Blockers 2 Passed - 09/05/2021  1:54 AM      Passed - Last BP in normal range    BP Readings from Last 1 Encounters:  07/31/21 128/70         Passed - Last Heart Rate in normal range    Pulse Readings from Last 1 Encounters:  07/31/21 93         Passed - Valid encounter within last 6 months    Recent Outpatient Visits          1 month ago Essential (primary) hypertension   [facility] Glean Hess, MD   7 months ago Essential (primary) hypertension   [facility] Glean Hess, MD   8 months ago Esophageal dysphagia   [facility] Glean Hess, MD   10 months ago Essential (primary) hypertension   [facility] Glean Hess, MD   1 year ago Essential (primary) hypertension   [facility] Glean Hess, MD      Future Appointments            In 5 months Army Melia Jesse Sans, MD [facility], [facility]

## 2021-09-10 DIAGNOSIS — M546 Pain in thoracic spine: Secondary | ICD-10-CM | POA: Diagnosis not present

## 2021-09-10 DIAGNOSIS — M9901 Segmental and somatic dysfunction of cervical region: Secondary | ICD-10-CM | POA: Diagnosis not present

## 2021-09-10 DIAGNOSIS — M531 Cervicobrachial syndrome: Secondary | ICD-10-CM | POA: Diagnosis not present

## 2021-09-10 DIAGNOSIS — M5137 Other intervertebral disc degeneration, lumbosacral region: Secondary | ICD-10-CM | POA: Diagnosis not present

## 2021-09-10 DIAGNOSIS — M9902 Segmental and somatic dysfunction of thoracic region: Secondary | ICD-10-CM | POA: Diagnosis not present

## 2021-09-10 DIAGNOSIS — M9903 Segmental and somatic dysfunction of lumbar region: Secondary | ICD-10-CM | POA: Diagnosis not present

## 2021-09-12 DIAGNOSIS — S90111A Contusion of right great toe without damage to nail, initial encounter: Secondary | ICD-10-CM | POA: Diagnosis not present

## 2021-09-12 DIAGNOSIS — S90211A Contusion of right great toe with damage to nail, initial encounter: Secondary | ICD-10-CM | POA: Diagnosis not present

## 2021-10-08 DIAGNOSIS — M9901 Segmental and somatic dysfunction of cervical region: Secondary | ICD-10-CM | POA: Diagnosis not present

## 2021-10-08 DIAGNOSIS — M546 Pain in thoracic spine: Secondary | ICD-10-CM | POA: Diagnosis not present

## 2021-10-08 DIAGNOSIS — M5137 Other intervertebral disc degeneration, lumbosacral region: Secondary | ICD-10-CM | POA: Diagnosis not present

## 2021-10-08 DIAGNOSIS — M531 Cervicobrachial syndrome: Secondary | ICD-10-CM | POA: Diagnosis not present

## 2021-10-08 DIAGNOSIS — M9903 Segmental and somatic dysfunction of lumbar region: Secondary | ICD-10-CM | POA: Diagnosis not present

## 2021-10-08 DIAGNOSIS — M9902 Segmental and somatic dysfunction of thoracic region: Secondary | ICD-10-CM | POA: Diagnosis not present

## 2021-10-31 ENCOUNTER — Ambulatory Visit (INDEPENDENT_AMBULATORY_CARE_PROVIDER_SITE_OTHER): Payer: Medicare Other | Admitting: Internal Medicine

## 2021-10-31 ENCOUNTER — Encounter: Payer: Self-pay | Admitting: Internal Medicine

## 2021-10-31 ENCOUNTER — Ambulatory Visit: Payer: Self-pay

## 2021-10-31 VITALS — BP 124/72 | HR 90 | Ht 61.0 in | Wt 130.0 lb

## 2021-10-31 DIAGNOSIS — R042 Hemoptysis: Secondary | ICD-10-CM | POA: Diagnosis not present

## 2021-10-31 NOTE — Telephone Encounter (Signed)
  Chief Complaint: coughed up chunk of bloody mucus that is hard and the size of the end of little finger Symptoms: sore throat Frequency: last night x 1  Pertinent Negatives: Patient denies any other sx (fever, SOB, chest pain r wheezing) Disposition: '[]'$ ED /'[]'$ Urgent Care (no appt availability in office) / '[x]'$ Appointment(In office/virtual)/ '[]'$  [place] [facility]/ '[]'$ Home Care/ '[]'$ Refused Recommended Disposition /'[]'$ [place] [facility]/ '[]'$  Follow-up with PCP Additional Notes: pt stated she called her  Endocrinologist and was advised to make appt with PCP or go to UC Reason for Disposition  Coughing up blood (all other triage questions negative)  Answer Assessment - Initial Assessment Questions 1. ONSET: "When did the cough begin?"      Pt has occasional cough from covid and pt coughed up bloody plug x1  2. SEVERITY: "How bad is the cough today?" "Did the blood appear after a coughing spell?"      Occasional cough 3. SPUTUM: "Describe the color of your sputum" (none, dry cough; clear, white, yellow, green)     - 4. HEMOPTYSIS: "How much blood?" (flecks, streaks, tablespoons, etc.)     Piece of mucous with blood 5. DIFFICULTY BREATHING: "Are you having difficulty breathing?" If Yes, ask: "How bad is it?" (e.g., mild, moderate, severe)    - MILD: No SOB at rest, mild SOB with walking, speaks normally in sentences, can lie down, no retractions, pulse < 100.    - MODERATE: SOB at rest, SOB with minimal exertion and prefers to sit, cannot lie down flat, speaks in phrases, mild retractions, audible wheezing, pulse 100-120.    - SEVERE: Very SOB at rest, speaks in single words, struggling to breathe, sitting hunched forward, retractions, pulse > 120      no 6. FEVER: "Do you have a fever?" If Yes, ask: "What is your temperature, how was it measured, and when did it start?"     no 7. CARDIAC HISTORY: "Do you have any history of heart disease?" (e.g., heart attack, congestive heart failure)       HTN 8. LUNG HISTORY: "Do you have any history of lung disease?"  (e.g., pulmonary embolus, asthma, emphysema)     no 9. PE RISK FACTORS: "Do you have a history of blood clots?" (or: recent major surgery, recent prolonged travel, bedridden)     no 10. OTHER SYMPTOMS: "Do you have any other symptoms?" (e.g., runny nose, wheezing, chest pain)       no 11. PREGNANCY: "Is there any chance you are pregnant?" "When was your last menstrual period?"       N/a 12. TRAVEL: "Have you traveled out of the country in the last month?" (e.g., travel history, exposures)       N/a  Protocols used: Coughing Up Blood-A-AH

## 2021-10-31 NOTE — Progress Notes (Signed)
Date:  10/31/2021   Name:  Danielle Herrera   DOB:  1933-03-04   MRN:  [id number]   Chief Complaint: Cough  Cough This is a new problem. Episode onset: one episode yesterday. The problem has been waxing and waning. Cough characteristics: yesterday coughing and coughed up a small clot of blood. Pertinent negatives include no chest pain, chills, fever, sore throat, shortness of breath or wheezing.  Pt has been well.  No cough, shortness of breath, fever, sinus or cold symptoms.  Last evening she felt the need to cough and brought up a less than dime sized blood clot.  Afterwards had no further symptoms.  She is not on a blood thinner but does take occasional Aleve.  Lab Results  Component Value Date   NA 143 01/30/2021   K 4.3 01/30/2021   CO2 27 01/30/2021   GLUCOSE 99 01/30/2021   BUN 21 01/30/2021   CREATININE 1.18 (H) 01/30/2021   CALCIUM 10.3 01/30/2021   EGFR 45 (L) 01/30/2021   GFRNONAA 52 (L) 01/23/2020   Lab Results  Component Value Date   CHOL 251 (H) 01/30/2021   HDL 79 01/30/2021   LDLCALC 156 (H) 01/30/2021   TRIG 95 01/30/2021   CHOLHDL 3.2 01/30/2021   Lab Results  Component Value Date   TSH 0.64 04/03/2020   No results found for: "HGBA1C" Lab Results  Component Value Date   WBC 7.0 01/30/2021   HGB 14.3 01/30/2021   HCT 43.8 01/30/2021   MCV 88 01/30/2021   PLT 278 01/30/2021   Lab Results  Component Value Date   ALT 13 01/30/2021   AST 19 01/30/2021   ALKPHOS 71 01/30/2021   BILITOT 0.4 01/30/2021   No results found for: "25OHVITD2", "25OHVITD3", "VD25OH"   Review of Systems  Constitutional:  Negative for chills, fatigue and fever.  HENT:  Negative for congestion, dental problem, sinus pain, sore throat and trouble swallowing.   Eyes:  Negative for visual disturbance.  Respiratory:  Negative for cough, shortness of breath and wheezing.   Cardiovascular:  Negative for chest pain and palpitations.  Hematological:  Negative for adenopathy.  Does not bruise/bleed easily.  Psychiatric/Behavioral:  Negative for dysphoric mood and sleep disturbance. The patient is not nervous/anxious.     Patient Active Problem List   Diagnosis Date Noted   Slow transit constipation 01/23/2020   Encounter for long-term (current) use of NSAIDs 05/26/2019   Benign essential tremor 03/10/2018   Insomnia 07/12/2017   Trochanteric bursitis of right hip 01/12/2017   Pseudophakia of right eye 12/12/2015   History of parathyroidectomy 07/10/2015   Hyperthyroidism, subclinical 04/22/2015   Hyperlipidemia, mixed 08/13/2014   Vitamin D deficiency 08/13/2014   Ventricular shunt in place 08/13/2014   Esophageal reflux 08/13/2014   Normal pressure hydrocephalus ([facility]) 11/05/2012   Abnormal gait 08/26/2012   Goiter, nontoxic, multinodular 05/23/2012   History of acoustic neuroma 01/20/2010   Colon polyp 01/20/2010   Dependent edema 01/20/2010   Essential (primary) hypertension 01/20/2010   Arthritis of knee, left 01/20/2010   Osteopenia 01/20/2010    Allergies  Allergen Reactions   Tylenol [Acetaminophen] Other (See Comments)    Elevated LFTs    Past Surgical History:  Procedure Laterality Date   ABDOMINAL HYSTERECTOMY     ACOUSTIC NEUROMA RESECTION Right 1985   APPENDECTOMY     BRAIN SURGERY  09/08/2012   burr hole with biopsy; cranial tongs caliper/stereotactic frame right (Dr. Ladoris Gene)   BREAST  EXCISIONAL BIOPSY Left    benign   BREAST EXCISIONAL BIOPSY Left    benign   BREAST SURGERY Left    lumpectomy   CATARACT EXTRACTION Bilateral    right eye several years ago; left eye fall 2021   COLON SURGERY     EXCISION/RELEASE BURSA HIP Right 10/29/2017   Procedure: EXCISION/RELEASE BURSA HIP- IT BAND;  Surgeon: Leim Fabry, MD;  Location: [facility] ORS;  Service: Orthopedics;  Laterality: Right;   EYE SURGERY Right    cataract   FRACTURE SURGERY Right    shoulder   PARATHYROIDECTOMY  2017   partial for hyperparathyroidism    SHOULDER ARTHROSCOPY W/ ACROMIAL REPAIR Right 95yrs ago   TUBAL LIGATION     VENTRICULOPERITONEAL SHUNT Right 09/08/2012   for NPH    Social History   Tobacco Use   Smoking status: Never   Smokeless tobacco: Never  Vaping Use   Vaping Use: Never used  Substance Use Topics   Alcohol use: Yes    Alcohol/week: 3.0 standard drinks of alcohol    Types: 3 Glasses of wine per week    Comment: Occasionally   Drug use: Never     Medication list has been reviewed and updated.  Current Meds  Medication Sig   amLODipine (NORVASC) 10 MG tablet TAKE 1 TABLET BY MOUTH EVERY DAY   carboxymethylcellul-glycerin (OPTIVE) 0.5-0.9 % ophthalmic solution Place 1 drop into both eyes 2 (two) times daily.    cholecalciferol (VITAMIN D3) 25 MCG (1000 UNIT) tablet Take 1,000 Units by mouth daily.   diphenhydrAMINE (BENADRYL) 25 MG tablet Take 25 mg by mouth at bedtime.   furosemide (LASIX) 20 MG tablet TAKE 1 TABLET BY MOUTH EVERY DAY   lisinopril (ZESTRIL) 10 MG tablet TAKE 1 TABLET BY MOUTH EVERY DAY   methimazole (TAPAZOLE) 5 MG tablet Take 5 mg by mouth daily. Dr. Kathlen Mody- [facility].   Multiple Vitamin (MULTIVITAMIN WITH MINERALS) TABS tablet Take 1 tablet by mouth daily. Centrum Silver for Women 50+   naproxen sodium (ALEVE) 220 MG tablet Take 4 tablets by mouth daily.   sennosides-docusate sodium (SENOKOT-S) 8.6-50 MG tablet Take 2 tablets by mouth 2 (two) times daily.        10/31/2021    1:24 PM 07/31/2021    9:17 AM 01/30/2021    9:01 AM 01/06/2021    3:17 PM  GAD 7 : Generalized Anxiety Score  Nervous, Anxious, on Edge 0 0 0 0  Control/stop worrying 0 0 0 0  Worry too much - different things 0 0 0 0  Trouble relaxing 0 0 0 0  Restless 0 0 0 0  Easily annoyed or irritable 0 0 0 0  Afraid - awful might happen 0 0 0 0  Total GAD 7 Score 0 0 0 0  Anxiety Difficulty Not difficult at all Not difficult at all Not difficult at all Not difficult at all       10/31/2021    1:24 PM 07/31/2021     9:17 AM 05/14/2021   10:19 AM  Depression screen PHQ 2/9  Decreased Interest 0 0 0  Down, Depressed, Hopeless 0 0 0  PHQ - 2 Score 0 0 0  Altered sleeping 0 0   Tired, decreased energy 0 0   Change in appetite 0 1   Feeling bad or failure about yourself  0 0   Trouble concentrating 0 0   Moving slowly or fidgety/restless 0 0   Suicidal thoughts 0  0   PHQ-9 Score 0 1   Difficult doing work/chores Not difficult at all Not difficult at all     BP Readings from Last 3 Encounters:  10/31/21 124/72  07/31/21 128/70  03/20/21 (!) 147/85    Physical Exam Vitals and nursing note reviewed.  Constitutional:      General: She is not in acute distress.    Appearance: Normal appearance. She is well-developed.  HENT:     Head: Normocephalic and atraumatic.     Nose:     Right Sinus: No maxillary sinus tenderness or frontal sinus tenderness.     Left Sinus: No maxillary sinus tenderness or frontal sinus tenderness.     Mouth/Throat:     Pharynx: Oropharynx is clear. Uvula midline. No oropharyngeal exudate or posterior oropharyngeal erythema.  Cardiovascular:     Rate and Rhythm: Normal rate and regular rhythm.  Pulmonary:     Effort: Pulmonary effort is normal. No respiratory distress.     Breath sounds: Normal breath sounds. No decreased breath sounds, wheezing or rhonchi.  Musculoskeletal:     Cervical back: Normal range of motion.  Lymphadenopathy:     Cervical: No cervical adenopathy.  Skin:    General: Skin is warm and dry.     Findings: No rash.  Neurological:     Mental Status: She is alert and oriented to person, place, and time.  Psychiatric:        Mood and Affect: Mood normal.        Behavior: Behavior normal.     Wt Readings from Last 3 Encounters:  10/31/21 130 lb (59 kg)  07/31/21 129 lb (58.5 kg)  03/20/21 131 lb (59.4 kg)    BP 124/72   Pulse 90   Ht $R'5\' 1"'AA$  (1.549 m)   Wt 130 lb (59 kg)   SpO2 95%   BMI 24.56 kg/m   Assessment and Plan: 1.  Hemoptysis One episode of scant blood last evening. No risk factors for lung disease No sinus symptoms  Instructed to return if recurrent.   Partially dictated using Editor, commissioning. Any errors are unintentional.  Halina Maidens, MD [facility]  10/31/2021

## 2021-11-05 DIAGNOSIS — M5137 Other intervertebral disc degeneration, lumbosacral region: Secondary | ICD-10-CM | POA: Diagnosis not present

## 2021-11-05 DIAGNOSIS — M9903 Segmental and somatic dysfunction of lumbar region: Secondary | ICD-10-CM | POA: Diagnosis not present

## 2021-11-05 DIAGNOSIS — M546 Pain in thoracic spine: Secondary | ICD-10-CM | POA: Diagnosis not present

## 2021-11-05 DIAGNOSIS — M531 Cervicobrachial syndrome: Secondary | ICD-10-CM | POA: Diagnosis not present

## 2021-11-05 DIAGNOSIS — M9901 Segmental and somatic dysfunction of cervical region: Secondary | ICD-10-CM | POA: Diagnosis not present

## 2021-11-05 DIAGNOSIS — M9902 Segmental and somatic dysfunction of thoracic region: Secondary | ICD-10-CM | POA: Diagnosis not present

## 2021-11-17 DIAGNOSIS — M25551 Pain in right hip: Secondary | ICD-10-CM | POA: Diagnosis not present

## 2021-11-20 DIAGNOSIS — Z872 Personal history of diseases of the skin and subcutaneous tissue: Secondary | ICD-10-CM | POA: Diagnosis not present

## 2021-11-20 DIAGNOSIS — Z85828 Personal history of other malignant neoplasm of skin: Secondary | ICD-10-CM | POA: Diagnosis not present

## 2021-11-20 DIAGNOSIS — L578 Other skin changes due to chronic exposure to nonionizing radiation: Secondary | ICD-10-CM | POA: Diagnosis not present

## 2021-11-20 DIAGNOSIS — Q069 Congenital malformation of spinal cord, unspecified: Secondary | ICD-10-CM | POA: Diagnosis not present

## 2021-11-20 DIAGNOSIS — L570 Actinic keratosis: Secondary | ICD-10-CM | POA: Diagnosis not present

## 2021-11-20 DIAGNOSIS — Z86018 Personal history of other benign neoplasm: Secondary | ICD-10-CM | POA: Diagnosis not present

## 2021-11-20 DIAGNOSIS — L821 Other seborrheic keratosis: Secondary | ICD-10-CM | POA: Diagnosis not present

## 2021-11-25 DIAGNOSIS — Z23 Encounter for immunization: Secondary | ICD-10-CM | POA: Diagnosis not present

## 2021-11-26 ENCOUNTER — Other Ambulatory Visit: Payer: Self-pay | Admitting: Internal Medicine

## 2021-11-26 NOTE — Telephone Encounter (Signed)
Requested medication (s) are due for refill today: yes  Requested medication (s) are on the active medication list: yes  Last refill:  07/17/21 #90   Future visit scheduled: yes  Notes to clinic:  lab work overdue   Requested Prescriptions  Pending Prescriptions Disp Refills   lisinopril (ZESTRIL) 10 MG tablet [Pharmacy Med Name: LISINOPRIL 10 MG TABLET] 90 tablet 0    Sig: TAKE 1 TABLET BY MOUTH EVERY DAY     Cardiovascular:  ACE Inhibitors Failed - 11/26/2021  2:45 AM      Failed - Cr in normal range and within 180 days    Creatinine  Date Value Ref Range Status  07/23/2012 0.55 (L) 0.60 - 1.30 mg/dL Final   Creatinine, Ser  Date Value Ref Range Status  01/30/2021 1.18 (H) 0.57 - 1.00 mg/dL Final         Failed - K in normal range and within 180 days    Potassium  Date Value Ref Range Status  01/30/2021 4.3 3.5 - 5.2 mmol/L Final  07/23/2012 3.4 (L) 3.5 - 5.1 mmol/L Final         Passed - Patient is not pregnant      Passed - Last BP in normal range    BP Readings from Last 1 Encounters:  10/31/21 124/72         Passed - Valid encounter within last 6 months    Recent Outpatient Visits           3 weeks ago Hemoptysis   [place] [facility] at [facility], Jesse Sans, MD   3 months ago Essential (primary) hypertension   [place] [facility] at [facility], Jesse Sans, MD   10 months ago Essential (primary) hypertension   [place] [facility] at [facility], Jesse Sans, MD   10 months ago Esophageal dysphagia   [place] [facility] at [facility], Jesse Sans, MD   1 year ago Essential (primary) hypertension    Primary Care and Sports Medicine at [facility], Jesse Sans, MD       Future Appointments             In 2 months Army Melia, Jesse Sans, MD [place] and Sports Medicine at  [facility], [facility]

## 2021-12-03 DIAGNOSIS — M9903 Segmental and somatic dysfunction of lumbar region: Secondary | ICD-10-CM | POA: Diagnosis not present

## 2021-12-03 DIAGNOSIS — M531 Cervicobrachial syndrome: Secondary | ICD-10-CM | POA: Diagnosis not present

## 2021-12-03 DIAGNOSIS — M9901 Segmental and somatic dysfunction of cervical region: Secondary | ICD-10-CM | POA: Diagnosis not present

## 2021-12-03 DIAGNOSIS — M5137 Other intervertebral disc degeneration, lumbosacral region: Secondary | ICD-10-CM | POA: Diagnosis not present

## 2021-12-03 DIAGNOSIS — M9902 Segmental and somatic dysfunction of thoracic region: Secondary | ICD-10-CM | POA: Diagnosis not present

## 2021-12-03 DIAGNOSIS — M546 Pain in thoracic spine: Secondary | ICD-10-CM | POA: Diagnosis not present

## 2021-12-26 ENCOUNTER — Other Ambulatory Visit: Payer: Self-pay | Admitting: Internal Medicine

## 2021-12-26 NOTE — Telephone Encounter (Signed)
Requested Prescriptions  Pending Prescriptions Disp Refills  . furosemide (LASIX) 20 MG tablet [Pharmacy Med Name: FUROSEMIDE 20 MG TABLET] 90 tablet 0    Sig: TAKE 1 TABLET BY MOUTH EVERY DAY     Cardiovascular:  Diuretics - Loop Failed - 12/26/2021  1:37 AM      Failed - K in normal range and within 180 days    Potassium  Date Value Ref Range Status  01/30/2021 4.3 3.5 - 5.2 mmol/L Final  07/23/2012 3.4 (L) 3.5 - 5.1 mmol/L Final         Failed - Ca in normal range and within 180 days    Calcium  Date Value Ref Range Status  01/30/2021 10.3 8.7 - 10.3 mg/dL Final   Calcium, Total  Date Value Ref Range Status  07/23/2012 9.8 8.5 - 10.1 mg/dL Final         Failed - Na in normal range and within 180 days    Sodium  Date Value Ref Range Status  01/30/2021 143 134 - 144 mmol/L Final  07/23/2012 142 136 - 145 mmol/L Final         Failed - Cr in normal range and within 180 days    Creatinine  Date Value Ref Range Status  07/23/2012 0.55 (L) 0.60 - 1.30 mg/dL Final   Creatinine, Ser  Date Value Ref Range Status  01/30/2021 1.18 (H) 0.57 - 1.00 mg/dL Final         Failed - Cl in normal range and within 180 days    Chloride  Date Value Ref Range Status  01/30/2021 104 96 - 106 mmol/L Final  07/23/2012 110 (H) 98 - 107 mmol/L Final         Failed - Mg Level in normal range and within 180 days    No results found for: "MG"       Passed - Last BP in normal range    BP Readings from Last 1 Encounters:  10/31/21 124/72         Passed - Valid encounter within last 6 months    Recent Outpatient Visits          1 month ago Hemoptysis   [place] [facility] at [facility], Jesse Sans, MD   4 months ago Essential (primary) hypertension   [place] [facility] Medicine at [facility], Jesse Sans, MD   11 months ago Essential (primary) hypertension   [place] [facility] and Sports Medicine at [facility], Jesse Sans, MD   11 months ago Esophageal dysphagia   [place] [facility] and [facility] Medicine at [facility], Jesse Sans, MD   1 year ago Essential (primary) hypertension   [place] [facility] and Sports Medicine at [facility], Jesse Sans, MD      Future Appointments            In 1 month Army Melia, Jesse Sans, MD [facility] Primary [facility] and Sports Medicine at [facility], [facility]

## 2022-01-13 DIAGNOSIS — H0288B Meibomian gland dysfunction left eye, upper and lower eyelids: Secondary | ICD-10-CM | POA: Diagnosis not present

## 2022-01-13 DIAGNOSIS — H353132 Nonexudative age-related macular degeneration, bilateral, intermediate dry stage: Secondary | ICD-10-CM | POA: Diagnosis not present

## 2022-01-13 DIAGNOSIS — M9903 Segmental and somatic dysfunction of lumbar region: Secondary | ICD-10-CM | POA: Diagnosis not present

## 2022-01-13 DIAGNOSIS — M531 Cervicobrachial syndrome: Secondary | ICD-10-CM | POA: Diagnosis not present

## 2022-01-13 DIAGNOSIS — M9902 Segmental and somatic dysfunction of thoracic region: Secondary | ICD-10-CM | POA: Diagnosis not present

## 2022-01-13 DIAGNOSIS — M5137 Other intervertebral disc degeneration, lumbosacral region: Secondary | ICD-10-CM | POA: Diagnosis not present

## 2022-01-13 DIAGNOSIS — M9901 Segmental and somatic dysfunction of cervical region: Secondary | ICD-10-CM | POA: Diagnosis not present

## 2022-01-13 DIAGNOSIS — H0288A Meibomian gland dysfunction right eye, upper and lower eyelids: Secondary | ICD-10-CM | POA: Diagnosis not present

## 2022-01-13 DIAGNOSIS — M546 Pain in thoracic spine: Secondary | ICD-10-CM | POA: Diagnosis not present

## 2022-02-02 ENCOUNTER — Ambulatory Visit (INDEPENDENT_AMBULATORY_CARE_PROVIDER_SITE_OTHER): Payer: Medicare Other | Admitting: Internal Medicine

## 2022-02-02 ENCOUNTER — Encounter: Payer: Self-pay | Admitting: Internal Medicine

## 2022-02-02 VITALS — BP 128/72 | HR 84 | Ht 61.0 in | Wt 124.0 lb

## 2022-02-02 DIAGNOSIS — Z1231 Encounter for screening mammogram for malignant neoplasm of breast: Secondary | ICD-10-CM | POA: Diagnosis not present

## 2022-02-02 DIAGNOSIS — E0590 Thyrotoxicosis, unspecified without thyrotoxic crisis or storm: Secondary | ICD-10-CM

## 2022-02-02 DIAGNOSIS — E559 Vitamin D deficiency, unspecified: Secondary | ICD-10-CM

## 2022-02-02 DIAGNOSIS — Z23 Encounter for immunization: Secondary | ICD-10-CM

## 2022-02-02 DIAGNOSIS — I10 Essential (primary) hypertension: Secondary | ICD-10-CM

## 2022-02-02 DIAGNOSIS — R1319 Other dysphagia: Secondary | ICD-10-CM

## 2022-02-02 DIAGNOSIS — E782 Mixed hyperlipidemia: Secondary | ICD-10-CM

## 2022-02-02 DIAGNOSIS — G912 (Idiopathic) normal pressure hydrocephalus: Secondary | ICD-10-CM | POA: Diagnosis not present

## 2022-02-02 LAB — POCT URINALYSIS DIPSTICK
Bilirubin, UA: NEGATIVE
Blood, UA: NEGATIVE
Glucose, UA: NEGATIVE
Ketones, UA: NEGATIVE
Leukocytes, UA: NEGATIVE
Nitrite, UA: NEGATIVE
Protein, UA: NEGATIVE
Spec Grav, UA: 1.025 (ref 1.010–1.025)
Urobilinogen, UA: 0.2 E.U./dL
pH, UA: 5 (ref 5.0–8.0)

## 2022-02-02 NOTE — Patient Instructions (Signed)
Call [facility] to schedule your mammogram at [phone].  

## 2022-02-02 NOTE — Progress Notes (Signed)
Date:  02/02/2022   Name:  Lianah Peed   DOB:  August 03, 1933   MRN:  [id number]   Chief Complaint: Annual Exam Danielle Herrera is a 88 y.o. female who presents today for her Complete Annual Exam. She feels well. She reports exercising walking regularly. She reports she is sleeping well. Breast complaints none. She has lost 6 lbs in the past three months - this she attributes to walking her dog daily 1/2 mile.  Mammogram: 01/2021 DEXA: 2023 Colonoscopy: aged out  Health Maintenance Due  Topic Date Due   COVID-19 Vaccine (9 - 2023-24 season) 01/20/2022    Immunization History  Administered Date(s) Administered   Fluad Quad(high Dose 65+) 11/08/2018   Influenza, High Dose Seasonal PF 11/22/2017, 11/07/2019, 11/25/2021   Influenza,inj,Quad PF,6+ Mos 12/23/2020   Influenza-Unspecified 11/26/2011, 11/30/2016, 11/21/2018, 12/23/2020   PFIZER Comirnaty(Gray Top)Covid-19 Tri-Sucrose Vaccine 03/15/2019, 04/06/2019, 11/27/2019, 06/06/2020   PFIZER(Purple Top)SARS-COV-2 Vaccination 03/15/2019, 04/06/2019   Pfizer Covid-19 Vaccine Bivalent Booster 2yr & up 12/30/2020   Pneumococcal Conjugate-13 04/24/2014, 03/02/2016   Pneumococcal Polysaccharide-23 05/31/2010, 07/01/2012, 11/21/2015   Pneumococcal-Unspecified 10/31/2012   Tdap 08/13/2014   Unspecified SARS-COV-2 Vaccination 11/25/2021   Zoster Recombinat (Shingrix) 07/03/2016, 08/30/2016   Zoster, Live 03/02/2010    Hypertension This is a chronic problem. The problem is controlled. Pertinent negatives include no chest pain, headaches, palpitations or shortness of breath. Past treatments include calcium channel blockers, ACE inhibitors and diuretics.  Dysphagia - recurrent issues - 2 episodes in the past 2 months.   The last one while eating sandwich and took all day to pass.   Lab Results  Component Value Date   NA 143 01/30/2021   K 4.3 01/30/2021   CO2 27 01/30/2021   GLUCOSE 99 01/30/2021   BUN 21 01/30/2021    CREATININE 1.18 (H) 01/30/2021   CALCIUM 10.3 01/30/2021   EGFR 45 (L) 01/30/2021   GFRNONAA 52 (L) 01/23/2020   Lab Results  Component Value Date   CHOL 251 (H) 01/30/2021   HDL 79 01/30/2021   LDLCALC 156 (H) 01/30/2021   TRIG 95 01/30/2021   CHOLHDL 3.2 01/30/2021   Lab Results  Component Value Date   TSH 0.64 04/03/2020   No results found for: "HGBA1C" Lab Results  Component Value Date   WBC 7.0 01/30/2021   HGB 14.3 01/30/2021   HCT 43.8 01/30/2021   MCV 88 01/30/2021   PLT 278 01/30/2021   Lab Results  Component Value Date   ALT 13 01/30/2021   AST 19 01/30/2021   ALKPHOS 71 01/30/2021   BILITOT 0.4 01/30/2021   No results found for: "25OHVITD2", "25OHVITD3", "VD25OH"   Review of Systems  Constitutional:  Negative for chills, fatigue and fever.  HENT:  Negative for congestion, hearing loss, tinnitus, trouble swallowing and voice change.   Eyes:  Negative for visual disturbance.  Respiratory:  Negative for cough, chest tightness, shortness of breath and wheezing.   Cardiovascular:  Negative for chest pain, palpitations and leg swelling.  Gastrointestinal:  Negative for abdominal pain, constipation, diarrhea and vomiting.  Endocrine: Negative for polydipsia and polyuria.  Genitourinary:  Negative for dysuria, frequency, genital sores, vaginal bleeding and vaginal discharge.  Musculoskeletal:  Negative for arthralgias, gait problem and joint swelling.  Skin:  Negative for color change and rash.  Neurological:  Negative for dizziness, tremors, light-headedness and headaches.  Hematological:  Negative for adenopathy. Does not bruise/bleed easily.  Psychiatric/Behavioral:  Negative for dysphoric mood and sleep disturbance. The  patient is not nervous/anxious.     Patient Active Problem List   Diagnosis Date Noted   Slow transit constipation 01/23/2020   Encounter for long-term (current) use of NSAIDs 05/26/2019   Benign essential tremor 03/10/2018   Insomnia  07/12/2017   Trochanteric bursitis of right hip 01/12/2017   Pseudophakia of right eye 12/12/2015   History of parathyroidectomy 07/10/2015   Hyperthyroidism, subclinical 04/22/2015   Hyperlipidemia, mixed 08/13/2014   Vitamin D deficiency 08/13/2014   Ventricular shunt in place 08/13/2014   Esophageal reflux 08/13/2014   Normal pressure hydrocephalus ([place]) 11/05/2012   Abnormal gait 08/26/2012   Goiter, nontoxic, multinodular 05/23/2012   History of acoustic neuroma 01/20/2010   Colon polyp 01/20/2010   Dependent edema 01/20/2010   Essential (primary) hypertension 01/20/2010   Arthritis of knee, left 01/20/2010   Osteopenia 01/20/2010    Allergies  Allergen Reactions   Tylenol [Acetaminophen] Other (See Comments)    Elevated LFTs    Past Surgical History:  Procedure Laterality Date   ABDOMINAL HYSTERECTOMY     ACOUSTIC NEUROMA RESECTION Right 1985   APPENDECTOMY     BRAIN SURGERY  09/08/2012   burr hole with biopsy; cranial tongs caliper/stereotactic frame right (Dr. Ralene Cork)   BREAST EXCISIONAL BIOPSY Left    benign   BREAST EXCISIONAL BIOPSY Left    benign   BREAST SURGERY Left    lumpectomy   CATARACT EXTRACTION Bilateral    right eye several years ago; left eye fall 2021   COLON SURGERY     EXCISION/RELEASE BURSA HIP Right 10/29/2017   Procedure: EXCISION/RELEASE BURSA HIP- IT BAND;  Surgeon: Leim Fabry, MD;  Location: [facility];  Service: Orthopedics;  Laterality: Right;   EYE SURGERY Right    cataract   FRACTURE SURGERY Right    shoulder   PARATHYROIDECTOMY  2017   partial for hyperparathyroidism   SHOULDER ARTHROSCOPY W/ ACROMIAL REPAIR Right 67yr ago   TUBAL LIGATION     VENTRICULOPERITONEAL SHUNT Right 09/08/2012   for NPH    Social History   Tobacco Use   Smoking status: Never   Smokeless tobacco: Never  Vaping Use   Vaping Use: Never used  Substance Use Topics   Alcohol use: Yes    Alcohol/week: 3.0 standard drinks of alcohol     Types: 3 Glasses of wine per week    Comment: Occasionally   Drug use: Never     Medication list has been reviewed and updated.  Current Meds  Medication Sig   amLODipine (NORVASC) 10 MG tablet TAKE 1 TABLET BY MOUTH EVERY DAY   carboxymethylcellul-glycerin (OPTIVE) 0.5-0.9 % ophthalmic solution Place 1 drop into both eyes 2 (two) times daily.    cholecalciferol (VITAMIN D3) 25 MCG (1000 UNIT) tablet Take 1,000 Units by mouth daily.   diphenhydrAMINE (BENADRYL) 25 MG tablet Take 25 mg by mouth at bedtime.   furosemide (LASIX) 20 MG tablet TAKE 1 TABLET BY MOUTH EVERY DAY   lisinopril (ZESTRIL) 10 MG tablet TAKE 1 TABLET BY MOUTH EVERY DAY   methimazole (TAPAZOLE) 5 MG tablet Take 5 mg by mouth daily. Dr. WKathlen Mody [facility].   Multiple Vitamin (MULTIVITAMIN WITH MINERALS) TABS tablet Take 1 tablet by mouth daily. Centrum Silver for Women 50+   Multiple Vitamins-Minerals (PRESERVISION AREDS 2 PO) Take 1 each by mouth 2 (two) times daily.   naproxen sodium (ALEVE) 220 MG tablet Take 4 tablets by mouth daily.   sennosides-docusate sodium (SENOKOT-S) 8.6-50 MG tablet  Take 2 tablets by mouth 2 (two) times daily.        02/02/2022    9:50 AM 10/31/2021    1:24 PM 07/31/2021    9:17 AM 01/30/2021    9:01 AM  GAD 7 : Generalized Anxiety Score  Nervous, Anxious, on Edge 0 0 0 0  Control/stop worrying 0 0 0 0  Worry too much - different things 0 0 0 0  Trouble relaxing 0 0 0 0  Restless 0 0 0 0  Easily annoyed or irritable 0 0 0 0  Afraid - awful might happen 0 0 0 0  Total GAD 7 Score 0 0 0 0  Anxiety Difficulty Not difficult at all Not difficult at all Not difficult at all Not difficult at all       02/02/2022    9:50 AM 10/31/2021    1:24 PM 07/31/2021    9:17 AM  Depression screen PHQ 2/9  Decreased Interest 0 0 0  Down, Depressed, Hopeless 0 0 0  PHQ - 2 Score 0 0 0  Altered sleeping 0 0 0  Tired, decreased energy 0 0 0  Change in appetite 0 0 1  Feeling bad or failure about  yourself  0 0 0  Trouble concentrating 0 0 0  Moving slowly or fidgety/restless 0 0 0  Suicidal thoughts 0 0 0  PHQ-9 Score 0 0 1  Difficult doing work/chores Not difficult at all Not difficult at all Not difficult at all    BP Readings from Last 3 Encounters:  02/02/22 128/72  10/31/21 124/72  07/31/21 128/70    Physical Exam Vitals and nursing note reviewed.  Constitutional:      General: She is not in acute distress.    Appearance: She is well-developed.  HENT:     Head: Normocephalic and atraumatic.     Right Ear: Tympanic membrane and ear canal normal.     Left Ear: Tympanic membrane and ear canal normal.     Nose:     Right Sinus: No maxillary sinus tenderness.     Left Sinus: No maxillary sinus tenderness.  Eyes:     General: No scleral icterus.       Right eye: No discharge.        Left eye: No discharge.     Conjunctiva/sclera: Conjunctivae normal.  Neck:     Thyroid: No thyromegaly.     Vascular: No carotid bruit.  Cardiovascular:     Rate and Rhythm: Normal rate and regular rhythm.     Pulses: Normal pulses.     Heart sounds: Normal heart sounds.  Pulmonary:     Effort: Pulmonary effort is normal. No respiratory distress.     Breath sounds: No wheezing.  Chest:  Breasts:    Right: No mass, nipple discharge, skin change or tenderness.     Left: No mass, nipple discharge, skin change or tenderness.  Abdominal:     General: Bowel sounds are normal.     Palpations: Abdomen is soft.     Tenderness: There is no abdominal tenderness.  Musculoskeletal:     Cervical back: Normal range of motion. No erythema.     Right lower leg: No edema.     Left lower leg: No edema.  Lymphadenopathy:     Cervical: No cervical adenopathy.  Skin:    General: Skin is warm and dry.     Capillary Refill: Capillary refill takes less than 2 seconds.  Findings: No rash.  Neurological:     Mental Status: She is alert and oriented to person, place, and time.     Cranial  Nerves: No cranial nerve deficit.     Sensory: No sensory deficit.     Deep Tendon Reflexes: Reflexes are normal and symmetric.  Psychiatric:        Attention and Perception: Attention normal.        Mood and Affect: Mood normal.       02/02/2022   10:00 AM 05/10/2019   10:13 AM 05/04/2018    1:45 PM 07/20/2016    1:53 PM  6CIT Screen  What Year? 0 points 0 points 0 points 0 points  What month? 0 points 0 points 0 points 0 points  What time? 0 points 0 points 0 points 0 points  Count back from 20 0 points 0 points 0 points 0 points  Months in reverse 0 points 0 points 0 points 0 points  Repeat phrase 0 points 0 points 0 points 2 points  Total Score 0 points 0 points 0 points 2 points       Wt Readings from Last 3 Encounters:  02/02/22 124 lb (56.2 kg)  10/31/21 130 lb (59 kg)  07/31/21 129 lb (58.5 kg)    BP 128/72   Pulse 84   Ht _0  (1.549 m)   Wt 124 lb (56.2 kg)   SpO2 96%   BMI 23.43 kg/m   Assessment and Plan: 1. Essential (primary) hypertension Clinically stable exam with well controlled BP. Tolerating medications without side effects at this time. Pt to continue current regimen and low sodium diet; benefits of regular exercise as able discussed. - CBC with Differential/Platelet - Comprehensive metabolic panel - POCT urinalysis dipstick  2. Encounter for screening mammogram for breast cancer - MM 3D SCREEN BREAST BILATERAL  3. Hyperlipidemia, mixed Continue low fat diet - Lipid panel  4. Other dysphagia Now 2 episodes in 2 months so needs to see GI - Ambulatory referral to Gastroenterology  5. Vitamin D deficiency - VITAMIN D 25 Hydroxy (Vit-D Deficiency, Fractures)  6. Need for vaccination for pneumococcus - Pneumococcal conjugate vaccine 20-valent (Prevnar 20)  7. Hyperthyroidism, subclinical Followed by Endo at [facility]  8. Normal pressure hydrocephalus (HCC) Has a shunt that appears to be functioning well. Memory testing is normal. Can see  Neurosurgery if desired.   Partially dictated using Editor, commissioning. Any errors are unintentional.  Halina Maidens, MD [facility]  02/02/2022

## 2022-02-03 ENCOUNTER — Telehealth: Payer: Self-pay | Admitting: *Deleted

## 2022-02-03 LAB — CBC WITH DIFFERENTIAL/PLATELET
Basophils Absolute: 0.1 10*3/uL (ref 0.0–0.2)
Basos: 2 %
EOS (ABSOLUTE): 0.5 10*3/uL — ABNORMAL HIGH (ref 0.0–0.4)
Eos: 7 %
Hematocrit: 41.9 % (ref 34.0–46.6)
Hemoglobin: 14 g/dL (ref 11.1–15.9)
Immature Grans (Abs): 0 10*3/uL (ref 0.0–0.1)
Immature Granulocytes: 0 %
Lymphocytes Absolute: 1.5 10*3/uL (ref 0.7–3.1)
Lymphs: 22 %
MCH: 29.4 pg (ref 26.6–33.0)
MCHC: 33.4 g/dL (ref 31.5–35.7)
MCV: 88 fL (ref 79–97)
Monocytes Absolute: 0.5 10*3/uL (ref 0.1–0.9)
Monocytes: 8 %
Neutrophils Absolute: 4.1 10*3/uL (ref 1.4–7.0)
Neutrophils: 61 %
Platelets: 318 10*3/uL (ref 150–450)
RBC: 4.76 x10E6/uL (ref 3.77–5.28)
RDW: 12 % (ref 11.7–15.4)
WBC: 6.7 10*3/uL (ref 3.4–10.8)

## 2022-02-03 LAB — LIPID PANEL
Chol/HDL Ratio: 3.4 ratio (ref 0.0–4.4)
Cholesterol, Total: 270 mg/dL — ABNORMAL HIGH (ref 100–199)
HDL: 80 mg/dL (ref >39)
LDL Chol Calc (NIH): 173 mg/dL — ABNORMAL HIGH (ref 0–99)
Triglycerides: 101 mg/dL (ref 0–149)
VLDL Cholesterol Cal: 17 mg/dL (ref 5–40)

## 2022-02-03 LAB — COMPREHENSIVE METABOLIC PANEL
ALT: 17 IU/L (ref 0–32)
AST: 26 IU/L (ref 0–40)
Albumin/Globulin Ratio: 1.8 (ref 1.2–2.2)
Albumin: 4.4 g/dL (ref 3.7–4.7)
Alkaline Phosphatase: 91 IU/L (ref 44–121)
BUN/Creatinine Ratio: 16 (ref 12–28)
BUN: 16 mg/dL (ref 8–27)
Bilirubin Total: 0.3 mg/dL (ref 0.0–1.2)
CO2: 22 mmol/L (ref 20–29)
Calcium: 10.1 mg/dL (ref 8.7–10.3)
Chloride: 105 mmol/L (ref 96–106)
Creatinine, Ser: 0.97 mg/dL (ref 0.57–1.00)
Globulin, Total: 2.4 g/dL (ref 1.5–4.5)
Glucose: 93 mg/dL (ref 70–99)
Potassium: 4.3 mmol/L (ref 3.5–5.2)
Sodium: 144 mmol/L (ref 134–144)
Total Protein: 6.8 g/dL (ref 6.0–8.5)
eGFR: 57 mL/min/{1.73_m2} — ABNORMAL LOW (ref >59)

## 2022-02-03 LAB — VITAMIN D 25 HYDROXY (VIT D DEFICIENCY, FRACTURES): Vit D, 25-Hydroxy: 45.1 ng/mL (ref 30.0–100.0)

## 2022-02-03 NOTE — Telephone Encounter (Signed)
Patient called for results and notified: Cholesterol is slightly higher.  Kidney function is very slightly decreased due to age.  All other labs are good.   Cholesterol numbers reviewed

## 2022-02-04 ENCOUNTER — Telehealth: Payer: Self-pay | Admitting: Internal Medicine

## 2022-02-04 NOTE — Telephone Encounter (Signed)
Pt states the number for [facility] she was given was to the [place] office. She wants to go to their [place] office. She said their number is [phone]  Pt has appt on Jan 22 at [facility]

## 2022-02-09 ENCOUNTER — Telehealth: Payer: Self-pay | Admitting: Internal Medicine

## 2022-02-09 NOTE — Telephone Encounter (Signed)
Copied from [place] [id number]. Topic: General - Inquiry >> Feb 09, 2022  1:41 PM Devoria Glassing wrote: Reason for CRM: pt called to let her dr know she is up to date on her covid vaccines.  The last 2 she got at [facility].  The dates were as follows: 12/30/20, and 11/25/2021

## 2022-02-09 NOTE — Telephone Encounter (Signed)
Noted   Covid vaccine up to date.  KP

## 2022-02-17 ENCOUNTER — Ambulatory Visit
Admission: RE | Admit: 2022-02-17 | Discharge: 2022-02-17 | Disposition: A | Discharge status: Home / Self Care | Payer: Medicare Other | Source: Ambulatory Visit | Attending: Internal Medicine | Admitting: Internal Medicine

## 2022-02-17 DIAGNOSIS — Z1231 Encounter for screening mammogram for malignant neoplasm of breast: Secondary | ICD-10-CM | POA: Diagnosis not present

## 2022-02-20 ENCOUNTER — Other Ambulatory Visit: Payer: Self-pay | Admitting: Internal Medicine

## 2022-02-28 ENCOUNTER — Other Ambulatory Visit: Payer: Self-pay | Admitting: Internal Medicine

## 2022-03-01 NOTE — Telephone Encounter (Signed)
Requested Prescriptions  Pending Prescriptions Disp Refills   amLODipine (NORVASC) 10 MG tablet [Pharmacy Med Name: AMLODIPINE BESYLATE 10 MG TAB] 90 tablet 1    Sig: TAKE 1 TABLET BY MOUTH EVERY DAY     Cardiovascular: Calcium Channel Blockers 2 Passed - 02/28/2022  9:01 AM      Passed - Last BP in normal range    BP Readings from Last 1 Encounters:  02/02/22 128/72         Passed - Last Heart Rate in normal range    Pulse Readings from Last 1 Encounters:  02/02/22 84         Passed - Valid encounter within last 6 months    Recent Outpatient Visits           3 weeks ago Essential (primary) hypertension   [place] [facility] at [facility], Jesse Sans, MD   4 months ago Hemoptysis   [place] [facility] at [facility], Jesse Sans, MD   7 months ago Essential (primary) hypertension   [place] [facility] at [facility], Jesse Sans, MD   1 year ago Essential (primary) hypertension   [place] [facility] at [facility], Jesse Sans, MD   1 year ago Esophageal dysphagia   [place] [facility] at [facility], Jesse Sans, MD       Future Appointments             In 5 months Army Melia, Jesse Sans, MD [facility] Medicine at [facility], [facility]   In 11 months Army Melia, Jesse Sans, MD [place] [facility] and Sports Medicine at [facility], [facility]

## 2022-03-18 DIAGNOSIS — M5442 Lumbago with sciatica, left side: Secondary | ICD-10-CM | POA: Diagnosis not present

## 2022-03-18 DIAGNOSIS — M955 Acquired deformity of pelvis: Secondary | ICD-10-CM | POA: Diagnosis not present

## 2022-03-18 DIAGNOSIS — M9903 Segmental and somatic dysfunction of lumbar region: Secondary | ICD-10-CM | POA: Diagnosis not present

## 2022-03-18 DIAGNOSIS — M9905 Segmental and somatic dysfunction of pelvic region: Secondary | ICD-10-CM | POA: Diagnosis not present

## 2022-03-18 DIAGNOSIS — M9902 Segmental and somatic dysfunction of thoracic region: Secondary | ICD-10-CM | POA: Diagnosis not present

## 2022-03-18 DIAGNOSIS — M546 Pain in thoracic spine: Secondary | ICD-10-CM | POA: Diagnosis not present

## 2022-03-23 ENCOUNTER — Encounter: Payer: Self-pay | Admitting: Gastroenterology

## 2022-03-23 ENCOUNTER — Ambulatory Visit (INDEPENDENT_AMBULATORY_CARE_PROVIDER_SITE_OTHER): Payer: Medicare Other | Admitting: Gastroenterology

## 2022-03-23 ENCOUNTER — Other Ambulatory Visit: Payer: Self-pay | Admitting: Internal Medicine

## 2022-03-23 VITALS — BP 127/77 | HR 98 | Temp 97.6°F | Ht 61.0 in | Wt 123.0 lb

## 2022-03-23 DIAGNOSIS — R1310 Dysphagia, unspecified: Secondary | ICD-10-CM | POA: Diagnosis not present

## 2022-03-23 NOTE — Addendum Note (Signed)
Addended by: Lurlean Nanny on: 03/23/2022 05:16 PM   Modules accepted: Orders

## 2022-03-23 NOTE — Patient Instructions (Signed)
Barium Swallow will be done at [facility]  If you need to cancel or reschedule, please call [phone]

## 2022-03-23 NOTE — Progress Notes (Signed)
Gastroenterology Consultation  Referring Provider:     Glean Hess, MD Primary Care Physician:  Glean Hess, MD Primary Gastroenterologist:  Dr. Allen Norris     Reason for Consultation:     Dysphagia        HPI:   Danielle Herrera is a 89 y.o. y/o female referred for consultation & management of dysphagia by Dr. Army Melia, Jesse Sans, MD. this patient reported to her primary care provider back in December that she had had episodes of dysphagia.  She had reported 2 episodes of dysphagia in the preceding 2 months. The patient reports that after she gets food stuck in her esophagus which happens intermittently, that she will drink some soda which will help it go down.  There is no report of any unexplained weight loss.  The patient also reports that she has constipation that she has been taking stool softeners for every day.  She also reports that at her last colonoscopy in [facility] she was told that she does not need any further colonoscopies.  Past Medical History:  Diagnosis Date   Acoustic neuroma ([place]) 01/20/2010   Formatting of this note might be different from the original. Hx of   Allergy    Arthritis of knee, left 01/20/2010   Bone spur 2019   right hip    Cataract 12/06/2019   surgery    Esophageal reflux 08/13/2014   Fibrocystic breast disease    Goiter, nontoxic, multinodular 05/23/2012   H/O jaundice    Hakim's syndrome ([place]) 11/05/2012   Hepatitis    as a child from drinking bad water   Hip pain, chronic, right    HLD (hyperlipidemia) 08/13/2014   Hyperparathyroidism, primary ([place]) 08/02/2013   Hypertension    Hyperthyroidism    Multiple thyroid nodules    Normal pressure hydrocephalus ([place]) 11/30/2013   Osteopenia 01/20/2010   Osteoporosis    Pneumonia 1960's   Thyroid disease    Ventricular shunt in place 08/13/2014   Vitamin D deficiency 08/13/2014    Past Surgical History:  Procedure Laterality Date   ABDOMINAL HYSTERECTOMY     ACOUSTIC NEUROMA  RESECTION Right 1985   APPENDECTOMY     BRAIN SURGERY  09/08/2012   burr hole with biopsy; cranial tongs caliper/stereotactic frame right (Dr. Ralene Cork)   BREAST EXCISIONAL BIOPSY Left    benign   BREAST EXCISIONAL BIOPSY Left    benign   BREAST SURGERY Left    lumpectomy   CATARACT EXTRACTION Bilateral    right eye several years ago; left eye fall 2021   COLON SURGERY     EXCISION/RELEASE BURSA HIP Right 10/29/2017   Procedure: EXCISION/RELEASE BURSA HIP- IT BAND;  Surgeon: Leim Fabry, MD;  Location: [facility] ORS;  Service: Orthopedics;  Laterality: Right;   EYE SURGERY Right    cataract   FRACTURE SURGERY Right    shoulder   PARATHYROIDECTOMY  2017   partial for hyperparathyroidism   SHOULDER ARTHROSCOPY W/ ACROMIAL REPAIR Right 9yr ago   TUBAL LIGATION     VENTRICULOPERITONEAL SHUNT Right 09/08/2012   for NPH    Prior to Admission medications   Medication Sig Start Date End Date Taking? Authorizing Provider  amLODipine (NORVASC) 10 MG tablet TAKE 1 TABLET BY MOUTH EVERY DAY 03/01/22   BGlean Hess MD  carboxymethylcellul-glycerin (OPTIVE) 0.5-0.9 % ophthalmic solution Place 1 drop into both eyes 2 (two) times daily.  11/18/11   [provider]  cholecalciferol (VITAMIN D3) 25  MCG (1000 UNIT) tablet Take 1,000 Units by mouth daily.    [provider]  diphenhydrAMINE (BENADRYL) 25 MG tablet Take 25 mg by mouth at bedtime.    [provider]  furosemide (LASIX) 20 MG tablet TAKE 1 TABLET BY MOUTH EVERY DAY 03/23/22   Glean Hess, MD  lisinopril (ZESTRIL) 10 MG tablet TAKE 1 TABLET BY MOUTH EVERY DAY 02/20/22   Glean Hess, MD  methimazole (TAPAZOLE) 5 MG tablet Take 5 mg by mouth daily. Dr. Kathlen Mody- [facility].    [provider]  Multiple Vitamin (MULTIVITAMIN WITH MINERALS) TABS tablet Take 1 tablet by mouth daily. Centrum Silver for Women 50+    [provider]  Multiple Vitamins-Minerals (PRESERVISION AREDS  2 PO) Take 1 each by mouth 2 (two) times daily.    [provider]  naproxen sodium (ALEVE) 220 MG tablet Take 4 tablets by mouth daily.    [provider]  sennosides-docusate sodium (SENOKOT-S) 8.6-50 MG tablet Take 2 tablets by mouth 2 (two) times daily.  09/18/13   [provider]    Family History  Problem Relation Age of Onset   Hypertension Mother    Mental illness Mother    Dementia Mother    Heart disease Father    Hypertension Father    Diabetes Sister    Stroke Brother    Heart disease Brother    Breast cancer Paternal Aunt      Social History   Tobacco Use   Smoking status: Never   Smokeless tobacco: Never  Vaping Use   Vaping Use: Never used  Substance Use Topics   Alcohol use: Yes    Alcohol/week: 3.0 standard drinks of alcohol    Types: 3 Glasses of wine per week    Comment: Occasionally   Drug use: Never    Allergies as of 03/23/2022 - Review Complete 02/02/2022  Allergen Reaction Noted   Tylenol [acetaminophen] Other (See Comments) 08/11/2016    Review of Systems:    All systems reviewed and negative except where noted in HPI.   Physical Exam:  There were no vitals taken for this visit. No LMP recorded. Patient has had a hysterectomy. General:   Alert,  Well-developed, well-nourished, pleasant and cooperative in NAD Head:  Normocephalic and atraumatic. Eyes:  Sclera clear, no icterus.   Conjunctiva pink. Ears:  Normal auditory acuity. Neck:  Supple; no masses or thyromegaly. Lungs:  Respirations even and unlabored.  Clear throughout to auscultation.   No wheezes, crackles, or rhonchi. No acute distress. Heart:  Regular rate and rhythm; no murmurs, clicks, rubs, or gallops. Abdomen:  Normal bowel sounds.  No bruits.  Soft, non-tender and non-distended without masses, hepatosplenomegaly or hernias noted.  No guarding or rebound tenderness.  Negative Carnett sign.   Rectal:  Deferred.  Pulses:  Normal pulses  noted. Extremities:  No clubbing or edema.  No cyanosis. Neurologic:  Alert and oriented x3;  grossly normal neurologically. Skin:  Intact without significant lesions or rashes.  No jaundice. Lymph Nodes:  No significant cervical adenopathy. Psych:  Alert and cooperative. Normal mood and affect.  Imaging Studies: No results found.  Assessment and Plan:   Danielle Herrera is a 89 y.o. y/o female who comes in today with a history of dysphagia.  The patient has had 2 episodes of dysphagia with a report of it causing her to feel like she cannot breathe.  The patient also reports that she has a shunt in  her head and has had a acoustic neuroma in the past.  The patient will be set up for a barium swallow to evaluate for any strictures or narrowings.  She has been told that due to her advanced age I rather do that and if something is found on the x-ray then she may need an upper endoscopy at that time.  The patient has been explained the plan and agrees with it.   Lucilla Lame, MD. Marval Regal    Note: This dictation was prepared with Dragon dictation along with smaller phrase technology. Any transcriptional errors that result from this process are unintentional.

## 2022-04-01 ENCOUNTER — Ambulatory Visit
Admission: RE | Admit: 2022-04-01 | Discharge: 2022-04-01 | Disposition: A | Discharge status: Home / Self Care | Payer: Medicare Other | Source: Ambulatory Visit | Attending: Gastroenterology | Admitting: Gastroenterology

## 2022-04-01 ENCOUNTER — Other Ambulatory Visit: Payer: Self-pay | Admitting: Gastroenterology

## 2022-04-01 DIAGNOSIS — R1310 Dysphagia, unspecified: Secondary | ICD-10-CM | POA: Insufficient documentation

## 2022-04-01 DIAGNOSIS — K224 Dyskinesia of esophagus: Secondary | ICD-10-CM | POA: Diagnosis not present

## 2022-04-01 DIAGNOSIS — K449 Diaphragmatic hernia without obstruction or gangrene: Secondary | ICD-10-CM | POA: Diagnosis not present

## 2022-04-02 ENCOUNTER — Telehealth: Payer: Self-pay | Admitting: Gastroenterology

## 2022-04-02 NOTE — Telephone Encounter (Signed)
Patient is wanting to see Dr. Allen Norris to go over her results for her barium swallow test. Where should I schedule her?

## 2022-04-03 NOTE — Telephone Encounter (Signed)
Appt scheduled for 04/06/22 at [facility] office, ok per Dr Allen Norris

## 2022-04-06 ENCOUNTER — Ambulatory Visit (INDEPENDENT_AMBULATORY_CARE_PROVIDER_SITE_OTHER): Payer: Medicare Other | Admitting: Gastroenterology

## 2022-04-06 ENCOUNTER — Encounter: Payer: Self-pay | Admitting: Gastroenterology

## 2022-04-06 VITALS — BP 106/71 | HR 128 | Temp 97.7°F | Wt 123.0 lb

## 2022-04-06 DIAGNOSIS — R1319 Other dysphagia: Secondary | ICD-10-CM

## 2022-04-06 NOTE — Progress Notes (Signed)
Primary Care Physician: Glean Hess, MD  Primary Gastroenterologist:  Dr. Lucilla Lame  Chief Complaint  Patient presents with   Follow-up    Discuss Results ESOPHAGUS/BARIUM SWALLOW/TABLET STUDY    HPI: Danielle Herrera is a 89 y.o. female here for follow-up after having a barium swallow.  The patient was sent for the barium swallow due to dysphagia.  The barium swallow showed:  IMPRESSION: 1.  Silent aspiration observed.   2.  Moderate hiatal hernia   3. Mildly patulous esophagus with moderate dysmotility characterized by tertiary contractions, delayed progression of barium bolus with Retropulsion  Patient continues to have symptoms of food getting stuck in the middle of her esophagus but denies any symptoms of aspiration.  Past Medical History:  Diagnosis Date   Acoustic neuroma ([place]) 01/20/2010   Formatting of this note might be different from the original. Hx of   Allergy    Arthritis of knee, left 01/20/2010   Bone spur 2019   right hip    Cataract 12/06/2019   surgery    Esophageal reflux 08/13/2014   Fibrocystic breast disease    Goiter, nontoxic, multinodular 05/23/2012   H/O jaundice    Hakim's syndrome ([place]) 11/05/2012   Hepatitis    as a child from drinking bad water   Hip pain, chronic, right    HLD (hyperlipidemia) 08/13/2014   Hyperparathyroidism, primary ([place]) 08/02/2013   Hypertension    Hyperthyroidism    Multiple thyroid nodules    Normal pressure hydrocephalus ([place]) 11/30/2013   Osteopenia 01/20/2010   Osteoporosis    Pneumonia 1960's   Thyroid disease    Ventricular shunt in place 08/13/2014   Vitamin D deficiency 08/13/2014    Current Outpatient Medications  Medication Sig Dispense Refill   amLODipine (NORVASC) 10 MG tablet TAKE 1 TABLET BY MOUTH EVERY DAY 90 tablet 1   carboxymethylcellul-glycerin (OPTIVE) 0.5-0.9 % ophthalmic solution Place 1 drop into both eyes 2 (two) times daily.      cholecalciferol (VITAMIN D3) 25  MCG (1000 UNIT) tablet Take 1,000 Units by mouth daily.     diphenhydrAMINE (BENADRYL) 25 MG tablet Take 25 mg by mouth at bedtime.     furosemide (LASIX) 20 MG tablet TAKE 1 TABLET BY MOUTH EVERY DAY 90 tablet 1   lisinopril (ZESTRIL) 10 MG tablet TAKE 1 TABLET BY MOUTH EVERY DAY 90 tablet 1   methimazole (TAPAZOLE) 5 MG tablet Take 5 mg by mouth daily. Dr. Kathlen Mody- [facility].     Multiple Vitamin (MULTIVITAMIN WITH MINERALS) TABS tablet Take 1 tablet by mouth daily. Centrum Silver for Women 50+     Multiple Vitamins-Minerals (PRESERVISION AREDS 2 PO) Take 1 each by mouth 2 (two) times daily.     naproxen sodium (ALEVE) 220 MG tablet Take 4 tablets by mouth daily.     sennosides-docusate sodium (SENOKOT-S) 8.6-50 MG tablet Take 2 tablets by mouth 2 (two) times daily.      No current facility-administered medications for this visit.    Allergies as of 04/06/2022 - Review Complete 04/06/2022  Allergen Reaction Noted   Tylenol [acetaminophen] Other (See Comments) 08/11/2016    ROS:  General: Negative for anorexia, weight loss, fever, chills, fatigue, weakness. ENT: Negative for hoarseness, difficulty swallowing , nasal congestion. CV: Negative for chest pain, angina, palpitations, dyspnea on exertion, peripheral edema.  Respiratory: Negative for dyspnea at rest, dyspnea on exertion, cough, sputum, wheezing.  GI: See history of present illness. GU:  Negative for dysuria,  hematuria, urinary incontinence, urinary frequency, nocturnal urination.  Endo: Negative for unusual weight change.    Physical Examination:   BP 106/71 (BP Location: Left Arm, Patient Position: Sitting, Cuff Size: Normal)   Pulse (!) 128   Temp 97.7 F (36.5 C) (Oral)   Wt 123 lb (55.8 kg)   BMI 23.24 kg/m   General: Well-nourished, well-developed in no acute distress.  Eyes: No icterus. Conjunctivae pink. Neuro: Alert and oriented x 3.  Grossly intact. Skin: Warm and dry, no jaundice.   Psych: Alert and  cooperative, normal mood and affect.  Labs:    Imaging Studies: DG ESOPHAGUS W DOUBLE CM (HD)  Result Date: 04/01/2022 CLINICAL DATA:  89 year old with trouble swallowing, feels "food and pills are getting stuck" in the mid chest. EXAM: ESOPHAGUS/BARIUM SWALLOW/TABLET STUDY TECHNIQUE: Combined double and single contrast examination was performed using effervescent crystals, high-density barium, and thin liquid barium. This exam was performed by Brynda Greathouse PA-C, and was supervised and interpreted by Damita Dunnings, MD. FLUOROSCOPY: Radiation Exposure Index (as provided by the fluoroscopic device): 9.0 mGy Kerma COMPARISON:  None Available. FINDINGS: Swallowing: Vestibular penetration with silent aspiration. No provocation to cough. Pharynx: Unremarkable.  No masses, lesions, or ulcers seen. Esophagus: Mildly patulous in appearance. No masses, lesions, or ulcers seen. Esophageal motility: Moderate dysmotility evidenced by presence of tertiary contractions and delayed progression of barium bolus with retrograde propulsion. Hiatal Hernia: Moderate hiatal hernia Gastroesophageal reflux: None visualized. Ingested 66m barium tablet: Not given due to evidence of silent aspiration Other: Incomplete exam due to silent aspiration. IMPRESSION: 1.  Silent aspiration observed. 2.  Moderate hiatal hernia 3. Mildly patulous esophagus with moderate dysmotility characterized by tertiary contractions, delayed progression of barium bolus with retropulsion Electronically Signed   By: JCorrie MckusickD.O.   On: 04/01/2022 11:03    Assessment and Plan:   Danielle Herrera a 89y.o. y/o female who comes in today with a history of dysphagia with a time swallow showing silent aspiration with a hiatal hernia and moderate dysmotility.  The patient has been told the results of the barium swallow and the risks of aspiration.  The patient will be set up with speech pathology for modified barium swallow and possible modalities to  decrease the risk of aspiration.  The patient has been explained the plan agrees with it.     DLucilla Lame MD. FMarval Regal   Note: This dictation was prepared with Dragon dictation along with smaller phrase technology. Any transcriptional errors that result from this process are unintentional.

## 2022-04-06 NOTE — Addendum Note (Signed)
Addended by: Lurlean Nanny on: 04/06/2022 04:05 PM   Modules accepted: Orders

## 2022-04-08 ENCOUNTER — Telehealth: Payer: Self-pay | Admitting: Gastroenterology

## 2022-04-08 DIAGNOSIS — E210 Primary hyperparathyroidism: Secondary | ICD-10-CM | POA: Diagnosis not present

## 2022-04-08 DIAGNOSIS — E0590 Thyrotoxicosis, unspecified without thyrotoxic crisis or storm: Secondary | ICD-10-CM | POA: Diagnosis not present

## 2022-04-08 LAB — TSH: TSH: 0.74 (ref 0.41–5.90)

## 2022-04-08 NOTE — Telephone Encounter (Signed)
Called pt, no answer and VM is full  Pt will be getting a call from [facility] to get this scheduled, not me

## 2022-04-08 NOTE — Telephone Encounter (Signed)
Patient calling asking about seeing a speech pathologist that Dr. Allen Norris was sending her to. States that Dr. Dorothey Baseman assistant was supposed to be calling her.

## 2022-04-09 ENCOUNTER — Other Ambulatory Visit: Payer: Self-pay | Admitting: Gastroenterology

## 2022-04-09 ENCOUNTER — Telehealth: Payer: Self-pay | Admitting: Gastroenterology

## 2022-04-09 DIAGNOSIS — K219 Gastro-esophageal reflux disease without esophagitis: Secondary | ICD-10-CM

## 2022-04-09 DIAGNOSIS — R1310 Dysphagia, unspecified: Secondary | ICD-10-CM

## 2022-04-09 NOTE — Telephone Encounter (Signed)
Pt called in ref to appointment for speech therapist Callback [phone]

## 2022-04-09 NOTE — Addendum Note (Signed)
Addended by: Lindalou Hose Y on: 04/09/2022 10:00 AM   Modules accepted: Orders

## 2022-04-09 NOTE — Telephone Encounter (Signed)
Pt is aware that Marcie Bal will be calling to schedule appt

## 2022-04-15 DIAGNOSIS — M5442 Lumbago with sciatica, left side: Secondary | ICD-10-CM | POA: Diagnosis not present

## 2022-04-15 DIAGNOSIS — M9903 Segmental and somatic dysfunction of lumbar region: Secondary | ICD-10-CM | POA: Diagnosis not present

## 2022-04-15 DIAGNOSIS — M9905 Segmental and somatic dysfunction of pelvic region: Secondary | ICD-10-CM | POA: Diagnosis not present

## 2022-04-15 DIAGNOSIS — M546 Pain in thoracic spine: Secondary | ICD-10-CM | POA: Diagnosis not present

## 2022-04-15 DIAGNOSIS — M955 Acquired deformity of pelvis: Secondary | ICD-10-CM | POA: Diagnosis not present

## 2022-04-15 DIAGNOSIS — M9902 Segmental and somatic dysfunction of thoracic region: Secondary | ICD-10-CM | POA: Diagnosis not present

## 2022-04-29 DIAGNOSIS — M546 Pain in thoracic spine: Secondary | ICD-10-CM | POA: Diagnosis not present

## 2022-04-29 DIAGNOSIS — M955 Acquired deformity of pelvis: Secondary | ICD-10-CM | POA: Diagnosis not present

## 2022-04-29 DIAGNOSIS — M9903 Segmental and somatic dysfunction of lumbar region: Secondary | ICD-10-CM | POA: Diagnosis not present

## 2022-04-29 DIAGNOSIS — M5442 Lumbago with sciatica, left side: Secondary | ICD-10-CM | POA: Diagnosis not present

## 2022-04-29 DIAGNOSIS — M9902 Segmental and somatic dysfunction of thoracic region: Secondary | ICD-10-CM | POA: Diagnosis not present

## 2022-04-29 DIAGNOSIS — M9905 Segmental and somatic dysfunction of pelvic region: Secondary | ICD-10-CM | POA: Diagnosis not present

## 2022-04-30 ENCOUNTER — Ambulatory Visit
Admission: RE | Admit: 2022-04-30 | Discharge: 2022-04-30 | Disposition: A | Discharge status: Home / Self Care | Payer: Medicare Other | Source: Ambulatory Visit | Attending: Gastroenterology | Admitting: Gastroenterology

## 2022-04-30 DIAGNOSIS — R1310 Dysphagia, unspecified: Secondary | ICD-10-CM | POA: Diagnosis not present

## 2022-04-30 DIAGNOSIS — K219 Gastro-esophageal reflux disease without esophagitis: Secondary | ICD-10-CM | POA: Diagnosis not present

## 2022-04-30 DIAGNOSIS — R1312 Dysphagia, oropharyngeal phase: Secondary | ICD-10-CM | POA: Diagnosis not present

## 2022-04-30 NOTE — Progress Notes (Addendum)
Modified Barium Swallow Study  Patient Details  Name: Danielle Herrera MRN: [id number] Date of Birth: 1933-11-30  Today's Date: 04/30/2022  Modified Barium Swallow completed.  Full report located under Chart Review in the Imaging Section.  History of Present Illness Pt is an 89 yo female w/ h/o multiple medical issues including GERD, hip and back pain, Essential hypertension, Fibrocystic breast disease, Goiter,   Hepatitis, Hypercalcemia,  Hyperlipidemia, Hypertension,  Hyperthyroidism, Multiple thyroid nodules 04/22/2015,   Normal pressure hydrocephalus 11/30/2013, shunt, OP. Seizures.    Per GI Note, pt has had 3 episodes of Esophageal phase dysphagia since last Fall-December. The patient reported it was when eating "Steak - medium rare" and "a sandwich" and that after she gets food stuck in her esophagus which happens intermittently, that she will drink some soda which will help it go down, or she just went to bed and "the feeling was gone" the next morning.  There is no report of any unexplained weight loss.  The patient also reports that she has constipation that she has been taking stool softeners for every day.    Last CXR was in 2019: "No evidence of active disease.".     DG Esophagus in 04/2022: "vestibular penetration with silent aspiration x1. No  provocation to cough.  Pharynx: Unremarkable.  No masses, lesions, or ulcers seen.   Esophagus: Mildly patulous in appearance. No masses, lesions, or  ulcers seen.   Esophageal motility: Moderate dysmotility evidenced by presence of  tertiary contractions and delayed progression of barium bolus with  retrograde propulsion.   Hiatal Hernia - Moderate hiatal hernia.".   Pt stated she was NOT on a PPI.  Pt DENIED any dxs of pneumonia or pulmonary issues. She endorsed Dry Cough at home but stated she was not eating 2 hours prior to bedtime and that she slept "elevated on 2 pillows" at night-- she felt the "coughing was better since sleeping elevated  on the pillows".    Clinical Impression: Clinical Impression: Patient presents with grossly functional oropharyngeal swallowing in setting of diagnosed Reflux and Esophageal phase Dysmotility- MODERATE(per DG Esophgagus, GI office note). See results of DG Esophagus, 04/2022. NO aspiration occurred during this study when pt followed general aspiration and Reflux precautions, including Smaller, Single sips via Cup, a dry swallow intermittently, and TIME b/t bites/sips to allow for Esophageal clearing.   Oral stage is characterized by adequate lip closure, bolus preparation and containment, and anterior to posterior transit. Swallow initiation occurs at the level of the valleculae for the majority of consistencies; spilling to the pyriform sinuses w/ thin liquids.  Pharyngeal stage is noted for mildly reduced tongue base retraction but otherwise, fair-adequate hyolaryngeal excursion and pharyngeal constriction. Epiglottic deflection is partial-complete w/ slightly decreased tight/complete closure and a trace+ narrow column of contrast coating along underneath side of the epiglottis-laryngeal vestibule w/ the thin liquid consistency fairly consistently. Pharyngeal stripping wave is slighlty decreased in strength. Noted trace+ BOT residue and diffuse vallecule/pyriform sinus residue w/ all consistencies. A f/u, Dry swallow aided in reducing this residue. Suspect potential impact from both age and Esophageal phase Dysmotility.   OF NOTE: Amplitude/duration of cricopharyngeus opening is slightly reduced for adequate/complete clearance of boluses through the CP segment and the cervical esophagus. Noted thickening of tissue in the Hyppharynx and bolus stasis/retention in the vewiable cervical esophagus. "Moderate Dysmotility" and "Retrograde Propulstion" along w/ a "Moderate Hiatal Hernia" were seen during the DG Esophagus study - 04/2022(see full report). Suspect this presentation of Moderate Dysmotility  w/ increased  presure w/in the Esophagus could have impact on the pharyngeal phase of swallow and clearance.     Consistencies tested: thin liquids x2 tsps, 1 cup sip, 2 sequential sips, nectar x1 tsp, 1 cup sip, 2 sequential cup sips, honey x1 tsp, pudding x1 tsp, regular solid (1/2 graham cracker with pudding).    Discussed general REFLUX strategies and general aspiration precautions including moistening dry meats, alternating solids and liquids, swallowing twice intermittent for clearing.  F/u w/ GI, PCP to monitor GERD and need for PPI; also monitor pulmonary status for any negative sequelae of potential post-prandial aspiration(of REFLUX material).  Education completed general Reflux precautions, food prep/options, and general aspiration precautions. No further ST indicated.  Factors that may increase risk of adverse event in presence of aspiration ([place] 2021): Respiratory or GI disease  Swallow Evaluation Recommendations Recommendations: PO diet PO Diet Recommendation: Regular;Thin liquids (Level 0) (ewll-cut meats; moistened foods) Liquid Administration via: Cup;No straw (less air swallowed) Medication Administration: Whole meds with liquid (but Whole w/ a Puree if needed in future for Esophageal motility/clearance) Supervision: Patient able to self-feed Swallowing strategies  : Minimize environmental distractions;Slow rate;Small bites/sips;Multiple dry swallows after each bite/sip;Follow solids with liquids (TIME b/t bites/sips to allow for Esophageal clearing) Postural changes: Position pt fully upright for meals;Stay upright 30-60 min after meals (GERD precautions as per GI) Oral care recommendations: Oral care QID (4x/day);Pt independent with oral care Recommended consults: Consider GI consultation;Consider esophageal assessment        Orinda Kenner, MS, CCC-SLP Speech Language Pathologist Rehab Services; [place] [phone]  (ascom) Deetta Siegmann 04/30/2022,5:53 PM

## 2022-05-14 DIAGNOSIS — M25551 Pain in right hip: Secondary | ICD-10-CM | POA: Diagnosis not present

## 2022-05-14 DIAGNOSIS — M25552 Pain in left hip: Secondary | ICD-10-CM | POA: Diagnosis not present

## 2022-05-20 ENCOUNTER — Ambulatory Visit (INDEPENDENT_AMBULATORY_CARE_PROVIDER_SITE_OTHER): Payer: Medicare Other

## 2022-05-20 VITALS — Ht 66.0 in | Wt 123.0 lb

## 2022-05-20 DIAGNOSIS — Z0000 Encounter for general adult medical examination without abnormal findings: Secondary | ICD-10-CM | POA: Diagnosis not present

## 2022-05-20 DIAGNOSIS — M5442 Lumbago with sciatica, left side: Secondary | ICD-10-CM | POA: Diagnosis not present

## 2022-05-20 DIAGNOSIS — M9902 Segmental and somatic dysfunction of thoracic region: Secondary | ICD-10-CM | POA: Diagnosis not present

## 2022-05-20 DIAGNOSIS — M9903 Segmental and somatic dysfunction of lumbar region: Secondary | ICD-10-CM | POA: Diagnosis not present

## 2022-05-20 DIAGNOSIS — M9905 Segmental and somatic dysfunction of pelvic region: Secondary | ICD-10-CM | POA: Diagnosis not present

## 2022-05-20 DIAGNOSIS — M546 Pain in thoracic spine: Secondary | ICD-10-CM | POA: Diagnosis not present

## 2022-05-20 DIAGNOSIS — M955 Acquired deformity of pelvis: Secondary | ICD-10-CM | POA: Diagnosis not present

## 2022-05-20 NOTE — Patient Instructions (Signed)
[name] , Thank you for taking time to come for your Medicare Wellness Visit. I appreciate your ongoing commitment to your health goals. Please review the following plan we discussed and let me know if I can assist you in the future.   These are the goals we discussed:  Goals      DIET - EAT MORE FRUITS AND VEGETABLES     Increase water intake     Recommend drinking 3-4 glasses of water a day.        This is a list of the screening recommended for you and due dates:  Health Maintenance  Topic Date Due   COVID-19 Vaccine (7 - 2023-24 season) 01/20/2022   Mammogram  02/18/2023   Medicare Annual Wellness Visit  05/20/2023   DTaP/Tdap/Td vaccine (2 - Td or Tdap) 08/12/2024   Pneumonia Vaccine  Completed   Flu Shot  Completed   DEXA scan (bone density measurement)  Completed   Zoster (Shingles) Vaccine  Completed   HPV Vaccine  Aged Out    Advanced directives: yes  Conditions/risks identified: none  Next appointment: Follow up in one year for your annual wellness visit 05/26/23 @ 9:15 am by phone   Preventive Care 65 Years and Older, Female Preventive care refers to lifestyle choices and visits with your health care provider that can promote health and wellness. What does preventive care include? A yearly physical exam. This is also called an annual well check. Dental exams once or twice a year. Routine eye exams. Ask your health care provider how often you should have your eyes checked. Personal lifestyle choices, including: Daily care of your teeth and gums. Regular physical activity. Eating a healthy diet. Avoiding tobacco and drug use. Limiting alcohol use. Practicing safe sex. Taking low-dose aspirin every day. Taking vitamin and mineral supplements as recommended by your health care provider. What happens during an annual well check? The services and screenings done by your health care provider during your annual well check will depend on your age, overall health,  lifestyle risk factors, and family history of disease. Counseling  Your health care provider may ask you questions about your: Alcohol use. Tobacco use. Drug use. Emotional well-being. Home and relationship well-being. Sexual activity. Eating habits. History of falls. Memory and ability to understand (cognition). Work and work Statistician. Reproductive health. Screening  You may have the following tests or measurements: Height, weight, and BMI. Blood pressure. Lipid and cholesterol levels. These may be checked every 5 years, or more frequently if you are over 70 years old. Skin check. Lung cancer screening. You may have this screening every year starting at age 33 if you have a 30-pack-year history of smoking and currently smoke or have quit within the past 15 years. Fecal occult blood test (FOBT) of the stool. You may have this test every year starting at age 54. Flexible sigmoidoscopy or colonoscopy. You may have a sigmoidoscopy every 5 years or a colonoscopy every 10 years starting at age 95. Hepatitis C blood test. Hepatitis B blood test. Sexually transmitted disease (STD) testing. Diabetes screening. This is done by checking your blood sugar (glucose) after you have not eaten for a while (fasting). You may have this done every 1-3 years. Bone density scan. This is done to screen for osteoporosis. You may have this done starting at age 36. Mammogram. This may be done every 1-2 years. Talk to your health care provider about how often you should have regular mammograms. Talk with your  health care provider about your test results, treatment options, and if necessary, the need for more tests. Vaccines  Your health care provider may recommend certain vaccines, such as: Influenza vaccine. This is recommended every year. Tetanus, diphtheria, and acellular pertussis (Tdap, Td) vaccine. You may need a Td booster every 10 years. Zoster vaccine. You may need this after age  19. Pneumococcal 13-valent conjugate (PCV13) vaccine. One dose is recommended after age 2. Pneumococcal polysaccharide (PPSV23) vaccine. One dose is recommended after age 50. Talk to your health care provider about which screenings and vaccines you need and how often you need them. This information is not intended to replace advice given to you by your health care provider. Make sure you discuss any questions you have with your health care provider. Document Released: 03/15/2015 Document Revised: 11/06/2015 Document Reviewed: 12/18/2014 Elsevier Interactive Patient Education  2017 [place] Prevention in the Home Falls can cause injuries. They can happen to people of all ages. There are many things you can do to make your home safe and to help prevent falls. What can I do on the outside of my home? Regularly fix the edges of walkways and driveways and fix any cracks. Remove anything that might make you trip as you walk through a door, such as a raised step or threshold. Trim any bushes or trees on the path to your home. Use bright outdoor lighting. Clear any walking paths of anything that might make someone trip, such as rocks or tools. Regularly check to see if handrails are loose or broken. Make sure that both sides of any steps have handrails. Any raised decks and porches should have guardrails on the edges. Have any leaves, snow, or ice cleared regularly. Use sand or salt on walking paths during winter. Clean up any spills in your garage right away. This includes oil or grease spills. What can I do in the bathroom? Use night lights. Install grab bars by the toilet and in the tub and shower. Do not use towel bars as grab bars. Use non-skid mats or decals in the tub or shower. If you need to sit down in the shower, use a plastic, non-slip stool. Keep the floor dry. Clean up any water that spills on the floor as soon as it happens. Remove soap buildup in the tub or shower  regularly. Attach bath mats securely with double-sided non-slip rug tape. Do not have throw rugs and other things on the floor that can make you trip. What can I do in the bedroom? Use night lights. Make sure that you have a light by your bed that is easy to reach. Do not use any sheets or blankets that are too big for your bed. They should not hang down onto the floor. Have a firm chair that has side arms. You can use this for support while you get dressed. Do not have throw rugs and other things on the floor that can make you trip. What can I do in the kitchen? Clean up any spills right away. Avoid walking on wet floors. Keep items that you use a lot in easy-to-reach places. If you need to reach something above you, use a strong step stool that has a grab bar. Keep electrical cords out of the way. Do not use floor polish or wax that makes floors slippery. If you must use wax, use non-skid floor wax. Do not have throw rugs and other things on the floor that can make you trip. What  can I do with my stairs? Do not leave any items on the stairs. Make sure that there are handrails on both sides of the stairs and use them. Fix handrails that are broken or loose. Make sure that handrails are as long as the stairways. Check any carpeting to make sure that it is firmly attached to the stairs. Fix any carpet that is loose or worn. Avoid having throw rugs at the top or bottom of the stairs. If you do have throw rugs, attach them to the floor with carpet tape. Make sure that you have a light switch at the top of the stairs and the bottom of the stairs. If you do not have them, ask someone to add them for you. What else can I do to help prevent falls? Wear shoes that: Do not have high heels. Have rubber bottoms. Are comfortable and fit you well. Are closed at the toe. Do not wear sandals. If you use a stepladder: Make sure that it is fully opened. Do not climb a closed stepladder. Make sure that  both sides of the stepladder are locked into place. Ask someone to hold it for you, if possible. Clearly mark and make sure that you can see: Any grab bars or handrails. First and last steps. Where the edge of each step is. Use tools that help you move around (mobility aids) if they are needed. These include: Canes. Walkers. Scooters. Crutches. Turn on the lights when you go into a dark area. Replace any light bulbs as soon as they burn out. Set up your furniture so you have a clear path. Avoid moving your furniture around. If any of your floors are uneven, fix them. If there are any pets around you, be aware of where they are. Review your medicines with your doctor. Some medicines can make you feel dizzy. This can increase your chance of falling. Ask your doctor what other things that you can do to help prevent falls. This information is not intended to replace advice given to you by your health care provider. Make sure you discuss any questions you have with your health care provider. Document Released: 12/13/2008 Document Revised: 07/25/2015 Document Reviewed: 03/23/2014 Elsevier Interactive Patient Education  2017 [employer].

## 2022-05-20 NOTE — Progress Notes (Signed)
I connected with  Danielle Herrera on 05/20/22 by a audio enabled telemedicine application and verified that I am speaking with the correct person using two identifiers.  Patient Location: Home  Provider Location: Office/Clinic  I discussed the limitations of evaluation and management by telemedicine. The patient expressed understanding and agreed to proceed.  Subjective:   Danielle Herrera is a 89 y.o. female who presents for Medicare Annual (Subsequent) preventive examination.  Review of Systems     Cardiac Risk Factors include: advanced age (>79men, >78 women);hypertension     Objective:    Today's Vitals   05/20/22 0956  PainSc: 5    There is no height or weight on file to calculate BMI.     05/20/2022   10:02 AM 05/14/2021   10:20 AM 01/30/2021    9:02 AM 05/13/2020   10:12 AM 05/10/2019   10:07 AM 05/04/2018    1:41 PM 11/01/2017   10:23 AM  Advanced Directives  Does Patient Have a Medical Advance Directive? Yes Yes No;Yes Yes Yes Yes Yes  Type of Paramedic of [place];Living will [place];Living will  [place];Living will [place];Living will Living will;Healthcare Power of Attorney   Does patient want to make changes to medical advance directive? No - Patient declined  No - Patient declined      Copy of [place] in Chart? Yes - validated most recent copy scanned in chart (See row information) Yes - validated most recent copy scanned in chart (See row information) Yes - validated most recent copy scanned in chart (See row information) Yes - validated most recent copy scanned in chart (See row information) Yes - validated most recent copy scanned in chart (See row information) Yes - validated most recent copy scanned in chart (See row information)   Would patient like information on creating a medical advance directive?   No - Guardian declined        Current  Medications (verified) Outpatient Encounter Medications as of 05/20/2022  Medication Sig   amLODipine (NORVASC) 10 MG tablet TAKE 1 TABLET BY MOUTH EVERY DAY   carboxymethylcellul-glycerin (OPTIVE) 0.5-0.9 % ophthalmic solution Place 1 drop into both eyes 2 (two) times daily.    cholecalciferol (VITAMIN D3) 25 MCG (1000 UNIT) tablet Take 1,000 Units by mouth daily.   diphenhydrAMINE (BENADRYL) 25 MG tablet Take 25 mg by mouth at bedtime.   furosemide (LASIX) 20 MG tablet TAKE 1 TABLET BY MOUTH EVERY DAY   lisinopril (ZESTRIL) 10 MG tablet TAKE 1 TABLET BY MOUTH EVERY DAY   methimazole (TAPAZOLE) 5 MG tablet Take 5 mg by mouth daily. Dr. Kathlen Mody- [facility].   Multiple Vitamin (MULTIVITAMIN WITH MINERALS) TABS tablet Take 1 tablet by mouth daily. Centrum Silver for Women 50+   Multiple Vitamins-Minerals (PRESERVISION AREDS 2 PO) Take 1 each by mouth 2 (two) times daily.   naproxen sodium (ALEVE) 220 MG tablet Take 4 tablets by mouth daily.   sennosides-docusate sodium (SENOKOT-S) 8.6-50 MG tablet Take 2 tablets by mouth 2 (two) times daily.    No facility-administered encounter medications on file as of 05/20/2022.    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 Hx of   Allergy    Arthritis of knee, left 01/20/2010   Bone spur 2019   right  hip    Cataract 12/06/2019   surgery    Esophageal reflux 08/13/2014   Fibrocystic breast disease    Goiter, nontoxic, multinodular 05/23/2012   H/O jaundice    Hakim's syndrome ([place]) 11/05/2012   Hepatitis    as a child from drinking bad water   Hip pain, chronic, right    HLD (hyperlipidemia) 08/13/2014   Hyperparathyroidism, primary ([place]) 08/02/2013   Hypertension    Hyperthyroidism    Multiple thyroid nodules    Normal pressure hydrocephalus ([place]) 11/30/2013   Osteopenia 01/20/2010    Osteoporosis    Pneumonia 1960's   Thyroid disease    Ventricular shunt in place 08/13/2014   Vitamin D deficiency 08/13/2014   Past Surgical History:  Procedure Laterality Date   ABDOMINAL HYSTERECTOMY     ACOUSTIC NEUROMA RESECTION Right [place]  09/08/2012   burr hole with biopsy; cranial tongs caliper/stereotactic frame right (Dr. Ralene Cork)   BREAST EXCISIONAL BIOPSY Left    benign   BREAST EXCISIONAL BIOPSY Left    benign   BREAST SURGERY Left    lumpectomy   CATARACT EXTRACTION Bilateral    right eye several years ago; left eye fall 2021   COLON SURGERY     EXCISION/RELEASE BURSA HIP Right 10/29/2017   Procedure: EXCISION/RELEASE BURSA HIP- IT BAND;  Surgeon: Leim Fabry, MD;  Location: [facility] ORS;  Service: Orthopedics;  Laterality: Right;   EYE SURGERY Right    cataract   FRACTURE SURGERY Right    shoulder   PARATHYROIDECTOMY  2017   partial for hyperparathyroidism   SHOULDER ARTHROSCOPY W/ ACROMIAL REPAIR Right 20yrs ago   TUBAL LIGATION     VENTRICULOPERITONEAL SHUNT Right 09/08/2012   for NPH   Family History  Problem Relation Age of Onset   Hypertension Mother    Mental illness Mother    Dementia Mother    Heart disease Father    Hypertension Father    Diabetes Sister    Stroke Brother    Heart disease Brother    Breast cancer Paternal Aunt    Social History   Socioeconomic History   Marital status: Widowed    Spouse name: Not on file   Number of children: 0   Years of education: Not on file   Highest education level: 12th grade  Occupational History   Occupation: retired  Tobacco Use   Smoking status: Never   Smokeless tobacco: Never  Vaping Use   Vaping Use: Never used  Substance and Sexual Activity   Alcohol use: Yes    Alcohol/week: 3.0 standard drinks of alcohol    Types: 3 Glasses of wine per week    Comment: Occasionally   Drug use: Never   Sexual activity: Not Currently  Other Topics  Concern   Not on file  Social History Narrative   Pt lives alone   Social Determinants of Health   Financial Resource Strain: Low Risk  (05/20/2022)   Overall Financial Resource Strain (CARDIA)    Difficulty of Paying Living Expenses: Not hard at all  Food Insecurity: No Food Insecurity (05/20/2022)   Hunger Vital Sign    Worried About Running Out of Food in the Last Year: Never true    Ran Out of Food in the Last Year: Never true  Transportation Needs: No Transportation Needs (05/20/2022)   PRAPARE - Hydrologist (Medical): No    Lack of Transportation (  Non-Medical): No  Physical Activity: Sufficiently Active (05/20/2022)   Exercise Vital Sign    Days of Exercise per Week: 7 days    Minutes of Exercise per Session: 30 min  Stress: No Stress Concern Present (05/20/2022)   [place]    Feeling of Stress : Not at all  Social Connections: Socially Isolated (05/20/2022)   Social Connection and Isolation Panel [NHANES]    Frequency of Communication with Friends and Family: More than three times a week    Frequency of Social Gatherings with Friends and Family: Twice a week    Attends Religious Services: Never    Marine scientist or Organizations: No    Attends Archivist Meetings: Never    Marital Status: Widowed    Tobacco Counseling Counseling given: Not Answered   Clinical Intake:  Pre-visit preparation completed: Yes  Pain : 0-10 Pain Score: 5  Pain Type: Chronic pain Pain Location: Hip Pain Relieving Factors: cortisone shots  Pain Relieving Factors: cortisone shots  Nutritional Risks: None Diabetes: No  How often do you need to have someone help you when you read instructions, pamphlets, or other written materials from your doctor or pharmacy?: 1 - Never  Diabetic?no  Interpreter Needed?: No  Information entered by :: Kirke Shaggy, LPN   Activities of  Daily Living    05/20/2022   10:05 AM 05/16/2022    9:47 AM  In your present state of health, do you have any difficulty performing the following activities:  Hearing? 1 1  Vision? 0 0  Difficulty concentrating or making decisions? 0 0  Walking or climbing stairs? 0 0  Dressing or bathing? 0 0  Doing errands, shopping? 0 0  Preparing Food and eating ? N N  Using the Toilet? N N  In the past six months, have you accidently leaked urine? N N  Do you have problems with loss of bowel control? N N  Managing your Medications? N N  Managing your Finances? N N  Housekeeping or managing your Housekeeping? N N    Patient Care Team: Glean Hess, MD as PCP - General (Internal Medicine) Karl Pock, MD as Referring Physician (Endocrinology) Ree Edman, MD (Dermatology) Chauncey Mann, [place] as Referring Physician (Chiropractic Medicine) Monna Fam, MD as Consulting Physician (Ophthalmology)  Indicate any recent Medical Services you may have received from other than [facility] providers in the past year (date may be approximate).     Assessment:   This is a routine wellness examination for [place].  Hearing/Vision screen Hearing Screening - Comments:: Wears aids, deaf in right ear Vision Screening - Comments:: Readers- Dr. Ellin Mayhew  Dietary issues and exercise activities discussed: Current Exercise Habits: Home exercise routine, Type of exercise: walking, Time (Minutes): 30, Frequency (Times/Week): 7, Weekly Exercise (Minutes/Week): 210, Intensity: Mild   Goals Addressed             This Visit's Progress    DIET - EAT MORE FRUITS AND VEGETABLES         Depression Screen    05/20/2022   10:00 AM 02/02/2022    9:50 AM 10/31/2021    1:24 PM 07/31/2021    9:17 AM 05/14/2021   10:19 AM 01/30/2021    9:01 AM 01/06/2021    3:17 PM  PHQ 2/9 Scores  PHQ - 2 Score 0 0 0 0 0 0 0  PHQ- 9 Score 0 0 0 1  0 0  Fall Risk    05/20/2022   10:05 AM 05/16/2022    9:47 AM  02/02/2022    9:49 AM 10/31/2021    1:24 PM 07/31/2021    9:17 AM  Fall Risk   Falls in the past year? 1 1 0 0 0  Number falls in past yr: 0 0 0 0 0  Injury with Fall? 0 0 0 0 0  Risk for fall due to : History of fall(s)  No Fall Risks No Fall Risks No Fall Risks  Follow up Falls prevention discussed;Falls evaluation completed  Falls evaluation completed Falls evaluation completed Falls evaluation completed    FALL RISK PREVENTION PERTAINING TO THE HOME:  Any stairs in or around the home? Yes  If so, are there any without handrails? No  Home free of loose throw rugs in walkways, pet beds, electrical cords, etc? Yes  Adequate lighting in your home to reduce risk of falls? Yes   ASSISTIVE DEVICES UTILIZED TO PREVENT FALLS:  Life alert? No  Use of a cane, walker or w/c? Yes - walker Grab bars in the bathroom? Yes  Shower chair or bench in shower? Yes  Elevated toilet seat or a handicapped toilet? Yes    Cognitive Function:        05/20/2022   10:08 AM 02/02/2022   10:00 AM 05/10/2019   10:13 AM 05/04/2018    1:45 PM 07/20/2016    1:53 PM  6CIT Screen  What Year? 0 points 0 points 0 points 0 points 0 points  What month? 0 points 0 points 0 points 0 points 0 points  What time? 0 points 0 points 0 points 0 points 0 points  Count back from 20 0 points 0 points 0 points 0 points 0 points  Months in reverse 0 points 0 points 0 points 0 points 0 points  Repeat phrase 0 points 0 points 0 points 0 points 2 points  Total Score 0 points 0 points 0 points 0 points 2 points    Immunizations Immunization History  Administered Date(s) Administered   Fluad Quad(high Dose 65+) 11/08/2018   Influenza, High Dose Seasonal PF 11/22/2017, 11/07/2019, 11/25/2021   Influenza,inj,Quad PF,6+ Mos 12/23/2020   Influenza-Unspecified 11/26/2011, 11/30/2016, 11/21/2018, 12/23/2020   PFIZER Comirnaty(Gray Top)Covid-19 Tri-Sucrose Vaccine 03/15/2019, 04/06/2019, 11/27/2019, 06/06/2020   PFIZER(Purple  Top)SARS-COV-2 Vaccination 11/25/2021   PNEUMOCOCCAL CONJUGATE-20 02/02/2022   Pfizer Covid-19 Vaccine Bivalent Booster 42yrs & up 12/30/2020   Pneumococcal Conjugate-13 04/24/2014, 03/02/2016   Pneumococcal Polysaccharide-23 05/31/2010, 07/01/2012, 11/21/2015   Pneumococcal-Unspecified 10/31/2012   Tdap 08/13/2014   Unspecified SARS-COV-2 Vaccination 11/25/2021   Zoster Recombinat (Shingrix) 07/03/2016, 08/30/2016   Zoster, Live 03/02/2010    TDAP status: Up to date  Flu Vaccine status: Up to date  Pneumococcal vaccine status: Up to date  Covid-19 vaccine status: Completed vaccines  Qualifies for Shingles Vaccine? Yes   Zostavax completed Yes   Shingrix Completed?: Yes  Screening Tests Health Maintenance  Topic Date Due   COVID-19 Vaccine (7 - 2023-24 season) 01/20/2022   MAMMOGRAM  02/18/2023   Medicare Annual Wellness (AWV)  05/20/2023   DTaP/Tdap/Td (2 - Td or Tdap) 08/12/2024   Pneumonia Vaccine 73+ Years old  Completed   INFLUENZA VACCINE  Completed   DEXA SCAN  Completed   Zoster Vaccines- Shingrix  Completed   HPV VACCINES  Aged Out    Health Maintenance  Health Maintenance Due  Topic Date Due   COVID-19 Vaccine (7 - 2023-24 season) 01/20/2022  Colorectal cancer screening: No longer required.   Mammogram status: No longer required due to age.  Lung Cancer Screening: (Low Dose CT Chest recommended if Age 48-80 years, 30 pack-year currently smoking OR have quit w/in 15years.) does not qualify.    Additional Screening:  Hepatitis C Screening: does not qualify; Completed no  Vision Screening: Recommended annual ophthalmology exams for early detection of glaucoma and other disorders of the eye. Is the patient up to date with their annual eye exam?  Yes  Who is the provider or what is the name of the office in which the patient attends annual eye exams? Dr.Woodard If pt is not established with a provider, would they like to be referred to a provider to  establish care? No .   Dental Screening: Recommended annual dental exams for proper oral hygiene  Community Resource Referral / Chronic Care Management: CRR required this visit?  No   CCM required this visit?  No      Plan:     I have personally reviewed and noted the following in the patient's chart:   Medical and social history Use of alcohol, tobacco or illicit drugs  Current medications and supplements including opioid prescriptions. Patient is not currently taking opioid prescriptions. Functional ability and status Nutritional status Physical activity Advanced directives List of other physicians Hospitalizations, surgeries, and ER visits in previous 12 months Vitals Screenings to include cognitive, depression, and falls Referrals and appointments  In addition, I have reviewed and discussed with patient certain preventive protocols, quality metrics, and best practice recommendations. A written personalized care plan for preventive services as well as general preventive health recommendations were provided to patient.     Dionisio David, LPN   [id number]   Nurse Notes: none

## 2022-06-08 ENCOUNTER — Encounter: Payer: Self-pay | Admitting: Gastroenterology

## 2022-06-08 ENCOUNTER — Ambulatory Visit (INDEPENDENT_AMBULATORY_CARE_PROVIDER_SITE_OTHER): Payer: Medicare Other | Admitting: Gastroenterology

## 2022-06-08 VITALS — BP 133/80 | HR 90 | Temp 97.7°F | Wt 123.0 lb

## 2022-06-08 DIAGNOSIS — R1319 Other dysphagia: Secondary | ICD-10-CM

## 2022-06-08 NOTE — Progress Notes (Signed)
Primary Care Physician: Reubin Milan, MD  Primary Gastroenterologist:  Dr. Midge Minium  Chief Complaint  Patient presents with   Follow-up    Discuss results    HPI: Danielle Herrera is a 89 y.o. female here for follow-up after having a barium swallow that showed silent aspiration and signs of possible dysmotility of the esophagus.  The patient underwent a modified barium swallow to better evaluate the patient's swallowing with the finding of:  "Moderate dysmotility evidenced by presence of tertiary contractions and delayed progression of barium bolus with retrograde propulsion.",   Part of the recommendations from speech pathology was -Recommended consults: Consider GI consultation; Consider esophageal assessment.  Whether that implied motility testing or luminal evaluation is unclear.  The patient reports that she has been doing well without any coughing episodes in the middle night since she propped her head up.  Past Medical History:  Diagnosis Date   Acoustic neuroma 01/20/2010   Formatting of this note might be different from the original. Hx of   Allergy    Arthritis of knee, left 01/20/2010   Bone spur 2019   right hip    Cataract 12/06/2019   surgery    Esophageal reflux 08/13/2014   Fibrocystic breast disease    Goiter, nontoxic, multinodular 05/23/2012   H/O jaundice    Hakim's syndrome 11/05/2012   Hepatitis    as a child from drinking bad water   Hip pain, chronic, right    HLD (hyperlipidemia) 08/13/2014   Hyperparathyroidism, primary 08/02/2013   Hypertension    Hyperthyroidism    Multiple thyroid nodules    Normal pressure hydrocephalus 11/30/2013   Osteopenia 01/20/2010   Osteoporosis    Pneumonia 1960's   Thyroid disease    Ventricular shunt in place 08/13/2014   Vitamin D deficiency 08/13/2014    Current Outpatient Medications  Medication Sig Dispense Refill   amLODipine (NORVASC) 10 MG tablet TAKE 1 TABLET BY MOUTH EVERY DAY 90  tablet 1   carboxymethylcellul-glycerin (OPTIVE) 0.5-0.9 % ophthalmic solution Place 1 drop into both eyes 2 (two) times daily.      cholecalciferol (VITAMIN D3) 25 MCG (1000 UNIT) tablet Take 1,000 Units by mouth daily.     diphenhydrAMINE (BENADRYL) 25 MG tablet Take 25 mg by mouth at bedtime.     furosemide (LASIX) 20 MG tablet TAKE 1 TABLET BY MOUTH EVERY DAY 90 tablet 1   lisinopril (ZESTRIL) 10 MG tablet TAKE 1 TABLET BY MOUTH EVERY DAY 90 tablet 1   methimazole (TAPAZOLE) 5 MG tablet Take 5 mg by mouth daily. Dr. Alben Spittle- [facility].     Multiple Vitamin (MULTIVITAMIN WITH MINERALS) TABS tablet Take 1 tablet by mouth daily. Centrum Silver for Women 50+     Multiple Vitamins-Minerals (PRESERVISION AREDS 2 PO) Take 1 each by mouth 2 (two) times daily.     naproxen sodium (ALEVE) 220 MG tablet Take 4 tablets by mouth daily.     sennosides-docusate sodium (SENOKOT-S) 8.6-50 MG tablet Take 2 tablets by mouth 2 (two) times daily.      No current facility-administered medications for this visit.    Allergies as of 06/08/2022 - Review Complete 06/08/2022  Allergen Reaction Noted   Tylenol [acetaminophen] Other (See Comments) 08/11/2016    ROS:  General: Negative for anorexia, weight loss, fever, chills, fatigue, weakness. ENT: Negative for hoarseness, difficulty swallowing , nasal congestion. CV: Negative for chest pain, angina, palpitations, dyspnea on exertion, peripheral edema.  Respiratory: Negative for  dyspnea at rest, dyspnea on exertion, cough, sputum, wheezing.  GI: See history of present illness. GU:  Negative for dysuria, hematuria, urinary incontinence, urinary frequency, nocturnal urination.  Endo: Negative for unusual weight change.    Physical Examination:   BP 133/80   Pulse 90   Temp 97.7 F (36.5 C) (Oral)   Wt 123 lb (55.8 kg)   BMI 19.85 kg/m   General: Well-nourished, well-developed in no acute distress.  Eyes: No icterus. Conjunctivae pink. Neuro: Alert and  oriented x 3.  Grossly intact. Skin: Warm and dry, no jaundice.   Psych: Alert and cooperative, normal mood and affect.  Labs:    Imaging Studies: No results found.  Assessment and Plan:   Danielle Herrera is a 89 y.o. y/o female who had a modified barium swallow with continued findings of a esophageal motility disorder.  The patient states she has been doing well with propping her head up at night.  The patient has been drinking plenty of water while she eats.  The patient has been told that since she is doing well I would not recommend doing an upper endoscopy on her at this time.  The patient has been explained the plan agrees with it.     Midge Minium, MD. Clementeen Graham    Note: This dictation was prepared with Dragon dictation along with smaller phrase technology. Any transcriptional errors that result from this process are unintentional.

## 2022-06-09 ENCOUNTER — Encounter: Payer: Self-pay | Admitting: Internal Medicine

## 2022-06-09 DIAGNOSIS — R1310 Dysphagia, unspecified: Secondary | ICD-10-CM | POA: Insufficient documentation

## 2022-06-24 DIAGNOSIS — M5442 Lumbago with sciatica, left side: Secondary | ICD-10-CM | POA: Diagnosis not present

## 2022-06-24 DIAGNOSIS — M9905 Segmental and somatic dysfunction of pelvic region: Secondary | ICD-10-CM | POA: Diagnosis not present

## 2022-06-24 DIAGNOSIS — M9903 Segmental and somatic dysfunction of lumbar region: Secondary | ICD-10-CM | POA: Diagnosis not present

## 2022-06-24 DIAGNOSIS — M546 Pain in thoracic spine: Secondary | ICD-10-CM | POA: Diagnosis not present

## 2022-06-24 DIAGNOSIS — M955 Acquired deformity of pelvis: Secondary | ICD-10-CM | POA: Diagnosis not present

## 2022-06-24 DIAGNOSIS — M9902 Segmental and somatic dysfunction of thoracic region: Secondary | ICD-10-CM | POA: Diagnosis not present

## 2022-07-07 DIAGNOSIS — M9903 Segmental and somatic dysfunction of lumbar region: Secondary | ICD-10-CM | POA: Diagnosis not present

## 2022-07-07 DIAGNOSIS — M9902 Segmental and somatic dysfunction of thoracic region: Secondary | ICD-10-CM | POA: Diagnosis not present

## 2022-07-07 DIAGNOSIS — M546 Pain in thoracic spine: Secondary | ICD-10-CM | POA: Diagnosis not present

## 2022-07-07 DIAGNOSIS — M5442 Lumbago with sciatica, left side: Secondary | ICD-10-CM | POA: Diagnosis not present

## 2022-07-07 DIAGNOSIS — M9905 Segmental and somatic dysfunction of pelvic region: Secondary | ICD-10-CM | POA: Diagnosis not present

## 2022-07-07 DIAGNOSIS — M955 Acquired deformity of pelvis: Secondary | ICD-10-CM | POA: Diagnosis not present

## 2022-07-09 DIAGNOSIS — M5442 Lumbago with sciatica, left side: Secondary | ICD-10-CM | POA: Diagnosis not present

## 2022-07-09 DIAGNOSIS — M955 Acquired deformity of pelvis: Secondary | ICD-10-CM | POA: Diagnosis not present

## 2022-07-09 DIAGNOSIS — M9905 Segmental and somatic dysfunction of pelvic region: Secondary | ICD-10-CM | POA: Diagnosis not present

## 2022-07-09 DIAGNOSIS — M546 Pain in thoracic spine: Secondary | ICD-10-CM | POA: Diagnosis not present

## 2022-07-09 DIAGNOSIS — M9903 Segmental and somatic dysfunction of lumbar region: Secondary | ICD-10-CM | POA: Diagnosis not present

## 2022-07-09 DIAGNOSIS — M9902 Segmental and somatic dysfunction of thoracic region: Secondary | ICD-10-CM | POA: Diagnosis not present

## 2022-07-15 DIAGNOSIS — M7061 Trochanteric bursitis, right hip: Secondary | ICD-10-CM | POA: Diagnosis not present

## 2022-07-15 DIAGNOSIS — M76892 Other specified enthesopathies of left lower limb, excluding foot: Secondary | ICD-10-CM | POA: Diagnosis not present

## 2022-07-15 DIAGNOSIS — M76891 Other specified enthesopathies of right lower limb, excluding foot: Secondary | ICD-10-CM | POA: Diagnosis not present

## 2022-07-15 DIAGNOSIS — M7062 Trochanteric bursitis, left hip: Secondary | ICD-10-CM | POA: Diagnosis not present

## 2022-07-22 DIAGNOSIS — M9905 Segmental and somatic dysfunction of pelvic region: Secondary | ICD-10-CM | POA: Diagnosis not present

## 2022-07-22 DIAGNOSIS — M9903 Segmental and somatic dysfunction of lumbar region: Secondary | ICD-10-CM | POA: Diagnosis not present

## 2022-07-22 DIAGNOSIS — M5442 Lumbago with sciatica, left side: Secondary | ICD-10-CM | POA: Diagnosis not present

## 2022-07-22 DIAGNOSIS — M955 Acquired deformity of pelvis: Secondary | ICD-10-CM | POA: Diagnosis not present

## 2022-07-22 DIAGNOSIS — M546 Pain in thoracic spine: Secondary | ICD-10-CM | POA: Diagnosis not present

## 2022-07-22 DIAGNOSIS — M9902 Segmental and somatic dysfunction of thoracic region: Secondary | ICD-10-CM | POA: Diagnosis not present

## 2022-08-04 ENCOUNTER — Ambulatory Visit (INDEPENDENT_AMBULATORY_CARE_PROVIDER_SITE_OTHER): Payer: Medicare Other | Admitting: Internal Medicine

## 2022-08-04 ENCOUNTER — Encounter: Payer: Self-pay | Admitting: Internal Medicine

## 2022-08-04 VITALS — BP 126/70 | HR 102 | Ht 66.0 in | Wt 123.0 lb

## 2022-08-04 DIAGNOSIS — M76892 Other specified enthesopathies of left lower limb, excluding foot: Secondary | ICD-10-CM

## 2022-08-04 DIAGNOSIS — I10 Essential (primary) hypertension: Secondary | ICD-10-CM | POA: Diagnosis not present

## 2022-08-04 DIAGNOSIS — N1831 Chronic kidney disease, stage 3a: Secondary | ICD-10-CM | POA: Insufficient documentation

## 2022-08-04 DIAGNOSIS — M76891 Other specified enthesopathies of right lower limb, excluding foot: Secondary | ICD-10-CM | POA: Diagnosis not present

## 2022-08-04 DIAGNOSIS — G912 (Idiopathic) normal pressure hydrocephalus: Secondary | ICD-10-CM

## 2022-08-04 DIAGNOSIS — E892 Postprocedural hypoparathyroidism: Secondary | ICD-10-CM | POA: Insufficient documentation

## 2022-08-04 HISTORY — DX: Postprocedural hypoparathyroidism: E89.2

## 2022-08-04 NOTE — Progress Notes (Signed)
Date:  08/04/2022   Name:  Danielle Herrera   DOB:  1933-10-18   MRN:  [id number]   Chief Complaint: Hypertension  Hypertension This is a chronic problem. The problem is controlled. Pertinent negatives include no chest pain, headaches, palpitations or shortness of breath.  Back Pain This is a chronic problem. The problem occurs constantly. The problem is unchanged. The pain is present in the lumbar spine. Pertinent negatives include no abdominal pain, chest pain, headaches or weakness. She has tried NSAIDs for the symptoms. The treatment provided mild relief.  Hip Pain  There was no injury mechanism. The pain is present in the left hip and right hip. The pain has been Constant since onset. Associated symptoms include an inability to bear weight. She has tried NSAIDs (tramadol from Ortho but he won't keep up the Rx) for the symptoms.    Lab Results  Component Value Date   NA 144 02/02/2022   K 4.3 02/02/2022   CO2 22 02/02/2022   GLUCOSE 93 02/02/2022   BUN 16 02/02/2022   CREATININE 0.97 02/02/2022   CALCIUM 10.1 02/02/2022   EGFR 57 (L) 02/02/2022   GFRNONAA 52 (L) 01/23/2020   Lab Results  Component Value Date   CHOL 270 (H) 02/02/2022   HDL 80 02/02/2022   LDLCALC 173 (H) 02/02/2022   TRIG 101 02/02/2022   CHOLHDL 3.4 02/02/2022   Lab Results  Component Value Date   TSH 0.64 04/03/2020   No results found for: "HGBA1C" Lab Results  Component Value Date   WBC 6.7 02/02/2022   HGB 14.0 02/02/2022   HCT 41.9 02/02/2022   MCV 88 02/02/2022   PLT 318 02/02/2022   Lab Results  Component Value Date   ALT 17 02/02/2022   AST 26 02/02/2022   ALKPHOS 91 02/02/2022   BILITOT 0.3 02/02/2022   Lab Results  Component Value Date   VD25OH 45.1 02/02/2022     Review of Systems  Constitutional:  Negative for fatigue and unexpected weight change.  HENT:  Negative for nosebleeds.   Eyes:  Negative for visual disturbance.  Respiratory:  Negative for cough, chest  tightness, shortness of breath and wheezing.   Cardiovascular:  Negative for chest pain, palpitations and leg swelling.  Gastrointestinal:  Negative for abdominal pain, constipation and diarrhea.  Musculoskeletal:  Positive for back pain.  Neurological:  Negative for dizziness, weakness, light-headedness and headaches.    Patient Active Problem List   Diagnosis Date Noted   CKD stage 3a, GFR 45-59 ml/min (HCC) 08/04/2022   Enthesopathy of hip region on both sides 08/04/2022   Dysphagia 06/09/2022   Slow transit constipation 01/23/2020   Encounter for long-term (current) use of NSAIDs 05/26/2019   Benign essential tremor 03/10/2018   Insomnia 07/12/2017   Trochanteric bursitis of right hip 01/12/2017   Pseudophakia of right eye 12/12/2015   History of parathyroidectomy 07/10/2015   Hyperthyroidism, subclinical 04/22/2015   Hyperlipidemia, mixed 08/13/2014   Vitamin D deficiency 08/13/2014   Ventricular shunt in place 08/13/2014   Esophageal reflux 08/13/2014   Normal pressure hydrocephalus (HCC) 11/05/2012   Abnormal gait 08/26/2012   Goiter, nontoxic, multinodular 05/23/2012   History of acoustic neuroma 01/20/2010   Colon polyp 01/20/2010   Dependent edema 01/20/2010   Essential (primary) hypertension 01/20/2010   Arthritis of knee, left 01/20/2010   Osteopenia 01/20/2010    Allergies  Allergen Reactions   Tylenol [Acetaminophen] Other (See Comments)    Elevated LFTs  Past Surgical History:  Procedure Laterality Date   ABDOMINAL HYSTERECTOMY     ACOUSTIC NEUROMA RESECTION Right 1985   APPENDECTOMY     BRAIN SURGERY  09/08/2012   burr hole with biopsy; cranial tongs caliper/stereotactic frame right (Dr. Ladoris Gene)   BREAST EXCISIONAL BIOPSY Left    benign   BREAST EXCISIONAL BIOPSY Left    benign   BREAST SURGERY Left    lumpectomy   CATARACT EXTRACTION Bilateral    right eye several years ago; left eye fall 2021   COLON SURGERY      EXCISION/RELEASE BURSA HIP Right 10/29/2017   Procedure: EXCISION/RELEASE BURSA HIP- IT BAND;  Surgeon: Signa Kell, MD;  Location: [facility] ORS;  Service: Orthopedics;  Laterality: Right;   EYE SURGERY Right    cataract   FRACTURE SURGERY Right    shoulder   PARATHYROIDECTOMY  2017   partial for hyperparathyroidism   SHOULDER ARTHROSCOPY W/ ACROMIAL REPAIR Right 39yrs ago   TUBAL LIGATION     VENTRICULOPERITONEAL SHUNT Right 09/08/2012   for NPH    Social History   Tobacco Use   Smoking status: Never   Smokeless tobacco: Never  Vaping Use   Vaping Use: Never used  Substance Use Topics   Alcohol use: Yes    Alcohol/week: 3.0 standard drinks of alcohol    Types: 3 Glasses of wine per week    Comment: Occasionally   Drug use: Never     Medication list has been reviewed and updated.  Current Meds  Medication Sig   amLODipine (NORVASC) 10 MG tablet TAKE 1 TABLET BY MOUTH EVERY DAY   carboxymethylcellul-glycerin (OPTIVE) 0.5-0.9 % ophthalmic solution Place 1 drop into both eyes 2 (two) times daily.    cholecalciferol (VITAMIN D3) 25 MCG (1000 UNIT) tablet Take 1,000 Units by mouth daily.   diphenhydrAMINE (BENADRYL) 25 MG tablet Take 25 mg by mouth at bedtime.   furosemide (LASIX) 20 MG tablet TAKE 1 TABLET BY MOUTH EVERY DAY   lisinopril (ZESTRIL) 10 MG tablet TAKE 1 TABLET BY MOUTH EVERY DAY   methimazole (TAPAZOLE) 5 MG tablet Take 5 mg by mouth daily. Dr. Alben Spittle- [facility].   Multiple Vitamin (MULTIVITAMIN WITH MINERALS) TABS tablet Take 1 tablet by mouth daily. Centrum Silver for Women 50+   Multiple Vitamins-Minerals (PRESERVISION AREDS 2 PO) Take 1 each by mouth 2 (two) times daily.   naproxen sodium (ALEVE) 220 MG tablet Take 4 tablets by mouth daily.   sennosides-docusate sodium (SENOKOT-S) 8.6-50 MG tablet Take 2 tablets by mouth 2 (two) times daily.    traMADol (ULTRAM) 50 MG tablet Take 50 mg by mouth every 6 (six) hours as needed for moderate pain.   [DISCONTINUED]  hydrochlorothiazide (MICROZIDE) 12.5 MG capsule Take 12.5 mg by mouth daily.       08/04/2022   11:01 AM 02/02/2022    9:50 AM 10/31/2021    1:24 PM 07/31/2021    9:17 AM  GAD 7 : Generalized Anxiety Score  Nervous, Anxious, on Edge 0 0 0 0  Control/stop worrying 0 0 0 0  Worry too much - different things 0 0 0 0  Trouble relaxing 0 0 0 0  Restless 0 0 0 0  Easily annoyed or irritable 0 0 0 0  Afraid - awful might happen 0 0 0 0  Total GAD 7 Score 0 0 0 0  Anxiety Difficulty Not difficult at all Not difficult at all Not difficult at all Not difficult  at all       08/04/2022   11:01 AM 05/20/2022   10:00 AM 02/02/2022    9:50 AM  Depression screen PHQ 2/9  Decreased Interest 0 0 0  Down, Depressed, Hopeless 1 0 0  PHQ - 2 Score 1 0 0  Altered sleeping 0 0 0  Tired, decreased energy 0 0 0  Change in appetite 0 0 0  Feeling bad or failure about yourself  0 0 0  Trouble concentrating 0 0 0  Moving slowly or fidgety/restless 0 0 0  Suicidal thoughts 0 0 0  PHQ-9 Score 1 0 0  Difficult doing work/chores Not difficult at all Not difficult at all Not difficult at all    BP Readings from Last 3 Encounters:  08/04/22 126/70  06/08/22 133/80  04/06/22 106/71    Physical Exam Vitals and nursing note reviewed.  Constitutional:      General: She is not in acute distress.    Appearance: She is well-developed.  HENT:     Head: Normocephalic and atraumatic.  Cardiovascular:     Rate and Rhythm: Normal rate and regular rhythm.  Pulmonary:     Effort: Pulmonary effort is normal. No respiratory distress.     Breath sounds: No wheezing or rhonchi.  Musculoskeletal:     Cervical back: Normal range of motion.     Right hip: Bony tenderness present. Decreased range of motion.     Left hip: Bony tenderness present. Decreased range of motion.  Skin:    General: Skin is warm and dry.     Findings: No rash.  Neurological:     Mental Status: She is alert and oriented to person, place, and  time.     Motor: Weakness present.     Gait: Gait abnormal (using a crutch of stability).  Psychiatric:        Mood and Affect: Mood normal.        Behavior: Behavior normal.     Wt Readings from Last 3 Encounters:  08/04/22 123 lb (55.8 kg)  06/08/22 123 lb (55.8 kg)  05/20/22 123 lb (55.8 kg)    BP 126/70   Pulse (!) 102   Ht 5\' 6"  (1.676 m)   Wt 123 lb (55.8 kg)   SpO2 98%   BMI 19.85 kg/m   Assessment and Plan:  Problem List Items Addressed This Visit     Normal pressure hydrocephalus (HCC) (Chronic)    Doing well s/p shunt in 2014      Essential (primary) hypertension - Primary (Chronic)    Stable exam with well controlled BP.  Currently taking lisinopril, amlodipine and lasix. Tolerating medications without concerns or side effects. Will continue to recommend low sodium diet and current regimen.       Enthesopathy of hip region on both sides    S/p injections by Dr. Allena Katz MRI ordered due to lack of benefit from steroid injections She was given Tramadol to take as needed and derived benefit but Ortho declined to continue the Rx Will refer to pain management - appointment after MRI      Relevant Orders   Ambulatory referral to Pain Clinic   CKD stage 3a, GFR 45-59 ml/min (HCC)    GFR has been stable for several years Will continue to monitor Advise reduced use of nsaids if possible       No follow-ups on file.   Partially dictated using Dragon software, any errors are not intentional.  Nyoka Cowden.  Judithann Graves, MD [facility] [place], [place]

## 2022-08-04 NOTE — Assessment & Plan Note (Signed)
Doing well s/p shunt in 2014

## 2022-08-04 NOTE — Assessment & Plan Note (Addendum)
Stable exam with well controlled BP.  Currently taking lisinopril, amlodipine and lasix. Tolerating medications without concerns or side effects. Will continue to recommend low sodium diet and current regimen.

## 2022-08-04 NOTE — Assessment & Plan Note (Signed)
S/p injections by Dr. Allena Katz MRI ordered due to lack of benefit from steroid injections She was given Tramadol to take as needed and derived benefit but Ortho declined to continue the Rx Will refer to pain management - appointment after MRI

## 2022-08-04 NOTE — Assessment & Plan Note (Signed)
GFR has been stable for several years Will continue to monitor Advise reduced use of nsaids if possible

## 2022-08-05 DIAGNOSIS — M9902 Segmental and somatic dysfunction of thoracic region: Secondary | ICD-10-CM | POA: Diagnosis not present

## 2022-08-05 DIAGNOSIS — M5442 Lumbago with sciatica, left side: Secondary | ICD-10-CM | POA: Diagnosis not present

## 2022-08-05 DIAGNOSIS — M9903 Segmental and somatic dysfunction of lumbar region: Secondary | ICD-10-CM | POA: Diagnosis not present

## 2022-08-05 DIAGNOSIS — M546 Pain in thoracic spine: Secondary | ICD-10-CM | POA: Diagnosis not present

## 2022-08-05 DIAGNOSIS — M9905 Segmental and somatic dysfunction of pelvic region: Secondary | ICD-10-CM | POA: Diagnosis not present

## 2022-08-05 DIAGNOSIS — M955 Acquired deformity of pelvis: Secondary | ICD-10-CM | POA: Diagnosis not present

## 2022-08-14 ENCOUNTER — Other Ambulatory Visit: Payer: Self-pay | Admitting: Internal Medicine

## 2022-08-26 DIAGNOSIS — M76892 Other specified enthesopathies of left lower limb, excluding foot: Secondary | ICD-10-CM | POA: Diagnosis not present

## 2022-08-26 DIAGNOSIS — M1611 Unilateral primary osteoarthritis, right hip: Secondary | ICD-10-CM | POA: Diagnosis not present

## 2022-08-26 DIAGNOSIS — M76891 Other specified enthesopathies of right lower limb, excluding foot: Secondary | ICD-10-CM | POA: Diagnosis not present

## 2022-08-26 DIAGNOSIS — Z982 Presence of cerebrospinal fluid drainage device: Secondary | ICD-10-CM | POA: Diagnosis not present

## 2022-08-26 DIAGNOSIS — M66852 Spontaneous rupture of other tendons, left thigh: Secondary | ICD-10-CM | POA: Diagnosis not present

## 2022-08-27 DIAGNOSIS — M9903 Segmental and somatic dysfunction of lumbar region: Secondary | ICD-10-CM | POA: Diagnosis not present

## 2022-08-27 DIAGNOSIS — M955 Acquired deformity of pelvis: Secondary | ICD-10-CM | POA: Diagnosis not present

## 2022-08-27 DIAGNOSIS — M546 Pain in thoracic spine: Secondary | ICD-10-CM | POA: Diagnosis not present

## 2022-08-27 DIAGNOSIS — M5442 Lumbago with sciatica, left side: Secondary | ICD-10-CM | POA: Diagnosis not present

## 2022-08-27 DIAGNOSIS — M9902 Segmental and somatic dysfunction of thoracic region: Secondary | ICD-10-CM | POA: Diagnosis not present

## 2022-08-27 DIAGNOSIS — M9905 Segmental and somatic dysfunction of pelvic region: Secondary | ICD-10-CM | POA: Diagnosis not present

## 2022-09-08 DIAGNOSIS — H353132 Nonexudative age-related macular degeneration, bilateral, intermediate dry stage: Secondary | ICD-10-CM | POA: Diagnosis not present

## 2022-09-08 DIAGNOSIS — H0288A Meibomian gland dysfunction right eye, upper and lower eyelids: Secondary | ICD-10-CM | POA: Diagnosis not present

## 2022-09-08 DIAGNOSIS — H0288B Meibomian gland dysfunction left eye, upper and lower eyelids: Secondary | ICD-10-CM | POA: Diagnosis not present

## 2022-09-08 DIAGNOSIS — Z961 Presence of intraocular lens: Secondary | ICD-10-CM | POA: Diagnosis not present

## 2022-09-15 ENCOUNTER — Encounter: Payer: Self-pay | Admitting: Student in an Organized Health Care Education/Training Program

## 2022-09-15 ENCOUNTER — Ambulatory Visit
Payer: Medicare Other | Attending: Student in an Organized Health Care Education/Training Program | Admitting: Student in an Organized Health Care Education/Training Program

## 2022-09-15 VITALS — BP 154/87 | HR 98 | Temp 97.1°F | Resp 16 | Ht 61.0 in | Wt 119.0 lb

## 2022-09-15 DIAGNOSIS — M47818 Spondylosis without myelopathy or radiculopathy, sacral and sacrococcygeal region: Secondary | ICD-10-CM | POA: Insufficient documentation

## 2022-09-15 DIAGNOSIS — G894 Chronic pain syndrome: Secondary | ICD-10-CM

## 2022-09-15 DIAGNOSIS — M533 Sacrococcygeal disorders, not elsewhere classified: Secondary | ICD-10-CM | POA: Diagnosis present

## 2022-09-15 DIAGNOSIS — G5703 Lesion of sciatic nerve, bilateral lower limbs: Secondary | ICD-10-CM | POA: Diagnosis present

## 2022-09-15 DIAGNOSIS — M160 Bilateral primary osteoarthritis of hip: Secondary | ICD-10-CM

## 2022-09-15 DIAGNOSIS — M461 Sacroiliitis, not elsewhere classified: Secondary | ICD-10-CM

## 2022-09-15 NOTE — Progress Notes (Signed)
Safety precautions to be maintained throughout the outpatient stay will include: orient to surroundings, keep bed in low position, maintain call bell within reach at all times, provide assistance with transfer out of bed and ambulation.  

## 2022-09-15 NOTE — Progress Notes (Signed)
Patient: Danielle Herrera  Service Category: E/M  Provider: Edward Jolly, MD  DOB: Sep 17, 1933  DOS: 09/15/2022  Referring Provider: Reubin Milan, MD  MRN: [id number]  Setting: Ambulatory outpatient  PCP: Reubin Milan, MD  Type: New Patient  Specialty: Interventional Pain Management    Location: Office  Delivery: Face-to-face     Primary Reason(s) for Visit: Encounter for initial evaluation of one or more chronic problems (new to examiner) potentially causing chronic pain, and posing a threat to normal musculoskeletal function. (Level of risk: High) CC: Hip Pain (bilateral) and Back Pain (low)  HPI  [name] is a 89 y.o. year old, female patient, who comes for the first time to our practice referred by Reubin Milan, MD for our initial evaluation of her chronic pain. She has History of acoustic neuroma; Colon polyp; Dependent edema; Abnormal gait; Hyperlipidemia, mixed; Essential (primary) hypertension; Arthritis of knee, left; Goiter, nontoxic, multinodular; Normal pressure hydrocephalus (HCC); Osteopenia; Vitamin D deficiency; Ventricular shunt in place; Esophageal reflux; History of parathyroidectomy; Pseudophakia of right eye; Hyperthyroidism, subclinical; Trochanteric bursitis of right hip; Insomnia; Benign essential tremor; Encounter for long-term (current) use of NSAIDs; Slow transit constipation; Dysphagia; CKD stage 3a, GFR 45-59 ml/min (HCC); Enthesopathy of hip region on both sides; SI (sacroiliac) joint dysfunction; Bilateral primary osteoarthritis of hip; Piriformis syndrome of both sides; and Chronic pain syndrome on their problem list. Today she comes in for evaluation of her Hip Pain (bilateral) and Back Pain (low)  Pain Assessment: Location: Right, Left Hip Radiating:   Onset: More than a month ago Duration: Chronic pain Quality: Sharp Severity: 6 /10 (subjective, self-reported pain score)  Effect on ADL: Limits activities Timing: Intermittent Modifying factors:  sitting BP: (!) 154/87  HR: 98  Onset and Duration: Gradual and Present longer than 3 months Cause of pain: Unknown Severity: NAS-11 at its worse: 8/10, NAS-11 at its best: 4/10, and NAS-11 on the average: 6/10 Timing: Morning Aggravating Factors: Bending, Lifiting, and Squatting Alleviating Factors: Cold packs, Hot packs, Lying down, Medications, Sitting, Sleeping, and Chiropractic manipulations Associated Problems: Pain that does not allow patient to sleep Quality of Pain: Aching, Shooting, and Uncomfortable Previous Examinations or Tests: MRI scan Previous Treatments: Biofeedback and Chiropractic manipulations  [name] is being evaluated for possible interventional pain management therapies for the treatment of her chronic pain.   Patient is a pleasant 89 year old female who presents with a chief complaint of low back pain as well as bilateral hip pain and buttock pain.  She has a history of right trochanteric bursectomy as well as right iliotibial band release with Dr. Allena Katz on October 29, 2017.  She has had trochanteric bursa injections with some benefit.  She has completed bilateral hip MRI.  She has an upcoming appointment with Dr. Allena Katz to discuss results.  She has done physical therapy, stretching exercises, medication trials with NSAIDs, acetaminophen, tramadol.  She is currently on tramadol and takes 50 mg twice a day.  She is wondering if this can be managed here at the pain clinic.  [name] has been informed that this initial visit was an evaluation only.  On the follow up appointment I will go over the results, including ordered tests and available interventional therapies. At that time she will have the opportunity to decide whether to proceed with offered therapies or not. In the event that [name] prefers avoiding interventional options, this will conclude our involvement in the case.  Medication management recommendations may be provided upon  request.  Historic  Controlled Substance Pharmacotherapy Review  PMP and historical list of controlled substances: Tramadol 50 mg twice daily. Historical Monitoring: The patient  reports no history of drug use. List of prior UDS Testing: No results found for: "MDMA", "COCAINSCRNUR", "PCPSCRNUR", "PCPQUANT", "CANNABQUANT", "THCU", "ETH", "CBDTHCR", "D8THCCBX", "D9THCCBX" Historical Background Evaluation: [place] PMP: PDMP reviewed during this encounter. Review of the past 102-months conducted.             Learned Department of public safety, offender search: Engineer, mining Information) Non-contributory Risk Assessment Profile: Aberrant behavior: None observed or detected today Risk factors for fatal opioid overdose: None identified today Fatal overdose hazard ratio (HR): Calculation deferred Non-fatal overdose hazard ratio (HR): Calculation deferred Risk of opioid abuse or dependence: 0.7-3.0% with doses ? 36 MME/day and 6.1-26% with doses ? 120 MME/day. Substance use disorder (SUD) risk level: See below Personal History of Substance Abuse (SUD-Substance use disorder):  Alcohol: Negative  Illegal Drugs: Negative  Rx Drugs: Negative  ORT Risk Level calculation: Low Risk  Opioid Risk Tool - 09/15/22 1005       Family History of Substance Abuse   Alcohol Negative    Illegal Drugs Negative    Rx Drugs Negative      Personal History of Substance Abuse   Alcohol Negative    Illegal Drugs Negative    Rx Drugs Negative      Age   Age between 16-45 years  No      History of Preadolescent Sexual Abuse   History of Preadolescent Sexual Abuse Negative or Female      Psychological Disease   Psychological Disease Negative    Depression Negative      Total Score   Opioid Risk Tool Scoring 0    Opioid Risk Interpretation Low Risk            ORT Scoring interpretation table:  Score <3 = Low Risk for SUD  Score between 4-7 = Moderate Risk for SUD  Score >8 = High Risk for Opioid Abuse   PHQ-2 Depression Scale:  Total  score: 0  PHQ-2 Scoring interpretation table: (Score and probability of major depressive disorder)  Score 0 = No depression  Score 1 = 15.4% Probability  Score 2 = 21.1% Probability  Score 3 = 38.4% Probability  Score 4 = 45.5% Probability  Score 5 = 56.4% Probability  Score 6 = 78.6% Probability   PHQ-9 Depression Scale:  Total score: 0  PHQ-9 Scoring interpretation table:  Score 0-4 = No depression  Score 5-9 = Mild depression  Score 10-14 = Moderate depression  Score 15-19 = Moderately severe depression  Score 20-27 = Severe depression (2.4 times higher risk of SUD and 2.89 times higher risk of overuse)   Pharmacologic Plan: As per protocol, I have not taken over any controlled substance management, pending the results of ordered tests and/or consults.            Initial impression: Pending review of available data and ordered tests.  Meds   Current Outpatient Medications:    amLODipine (NORVASC) 10 MG tablet, TAKE 1 TABLET BY MOUTH EVERY DAY, Disp: 90 tablet, Rfl: 1   cholecalciferol (VITAMIN D3) 25 MCG (1000 UNIT) tablet, Take 1,000 Units by mouth daily., Disp: , Rfl:    furosemide (LASIX) 20 MG tablet, TAKE 1 TABLET BY MOUTH EVERY DAY, Disp: 90 tablet, Rfl: 1   lisinopril (ZESTRIL) 10 MG tablet, TAKE 1 TABLET BY MOUTH EVERY DAY, Disp: 90 tablet, Rfl:  1   methimazole (TAPAZOLE) 5 MG tablet, Take 5 mg by mouth daily. Dr. Alben Spittle- [facility]., Disp: , Rfl:    Multiple Vitamin (MULTIVITAMIN WITH MINERALS) TABS tablet, Take 1 tablet by mouth daily. Centrum Silver for Women 50+, Disp: , Rfl:    Multiple Vitamins-Minerals (PRESERVISION AREDS 2 PO), Take 1 each by mouth 2 (two) times daily., Disp: , Rfl:    naproxen sodium (ALEVE) 220 MG tablet, Take 4 tablets by mouth daily., Disp: , Rfl:    sennosides-docusate sodium (SENOKOT-S) 8.6-50 MG tablet, Take 2 tablets by mouth 2 (two) times daily. , Disp: , Rfl:    traMADol (ULTRAM) 50 MG tablet, Take 50 mg by mouth every 6 (six) hours as  needed for moderate pain., Disp: , Rfl:   Imaging Review  MRI HIP LEFT WITHOUT CONTRAST MRI HIP RIGHT WITHOUT CONTRAST  Indication: Hip pain, chronic, tendon/ligament abnormality suspected, x-ray done, Z61.096 Other specified enthesopathies of right lower limb, excluding foot, E45.409 Other specified enthesopathies of left lower limb, excluding foot 89 y.o. female patient s/p right trochanteric bursectomy and IT band release on 10/29/2017.  She has had some worsening of her persistent right sided lateral hip pain.  Prior right trochanteric bursa corticosteroid injection gave her excellent relief for approximately 4 months.  She appears to be having bilateral trochanteric pain syndrome that did not respond to most recent injections, although previous injections did help.   Comparison(s): Hip and pelvis radiographs 02/16/2019. Right hip MRI 10/19/2017.  Technique: Noncontrast multiplanar, multiecho imaging of the right hip and pelvis was performed, including T1-weighted and fluid sensitive sequences. Noncontrast multiplanar, multiecho imaging of the left hip and pelvis was performed, including T1-weighted and fluid sensitive sequences.     FINDINGS:  RIGHT: Marrow: No fracture or femoral head AVN.  Hip joint: Partial-thickness cartilage loss is present across the femoral head as well as the peripheral superior acetabulum. Small femoral head marginal osteophytes are noted. There is degeneration and tearing of the anterior superior labrum. There is a tear of the posterior labrum with a small para labral cyst (8:13; 6:10). No malalignment. No effusion.  Tendons: *  High-grade tearing of the right hamstring tendon origin. No reactive marrow change in the adjacent ischium. The findings have progressed from prior. *   Mild edema like signal within the right gluteus minimus muscle without tendon tear. Right gluteus medius tendon is intact. *  There is tendinosis and likely partial  tearing of the iliopsoas tendon near the femoral attachment. *  There is associated scar posteriorly in keeping with patient's history of prior open bursectomy. There is trace trochanteric bursal fluid. *  Small amount of fluid is noted in the right rectus femoris bursa. Small amount of greater trochanteric bursal fluid.  Soft tissues: *  Mild edema like signal in the ischiofemoral space. *  There is atrophy with fatty infiltration of the gluteus minimus muscle, progressed from prior.  Other: No additional findings.  LEFT: Marrow: No fracture or femoral head AVN.  Visualized SI joints and pubic symphysis: Mild arthrosis at the bilateral SI joints and pubic symphysis.  Hip joint: Partial-thickness cartilage loss along the superior lateral femoral head likely superior lateral acetabulum.. Although not optimally assessed, there is a tear of the anterior superior labrum.  No malalignment. No effusion.  Tendons: Partial-thickness tearing along the anterior aspect of the left gluteus medias tendon at its femoral attachment . Signal abnormality along the gluteus maximus myotendinous junction (series 8 image 19) suggestive of tendinosis  versus tearing. Gluteus minimus tendon is intact. Rectus femoris and iliopsoas tendons are intact. Partial thickness tearing is also present at the left hamstring tendon origin.  Soft tissues: Increased edema and muscle atrophy suggestive of ischiofemoral impingement.  Other: Lower lumbar spondylosis and facet arthropathy. Small volume pelvic free fluid is less likely physiologic.   IMPRESSION:   Right hip: 1.  Osteoarthritis of the right hip. 2.  High-grade partial-thickness hamstring tendon origin tearing. 3.  Mild edema within the gluteus minimus muscle belly suggests low-grade strain. 4.  Small volume fluid within the right rectus femoris bursa. 5.  Status post right open peritrochanteric bursectomy and IT band release. 6.  Tendinosis and  likely partial tearing of the iliopsoas tendon near its femoral attachment. 7.  Mild edema in the ischiofemoral space as can be seen with ischiofemoral impingement in the correct clinical scenario.  Left hip: 1.  Partial-thickness tear of the gluteus medius tendon insertion. 2.  Gluteus maximus myotendinous junction signal abnormality suggestive of tendinosis versus low-grade tearing. 3.  Partial thickness tearing at the left hamstring tendon origin. 4.  Low-grade left hip chondral abnormalities with an associated anterior superior labral tear.   Electronically Reviewed by:  Lindaann Pascal, MD, [facility] Electronically Reviewed on:  08/27/2022 8:41 AM  I have reviewed the images and concur with the above findings.  Electronically Signed by:  Barnetta Hammersmith, MD, [facility] Electronically Signed on:  08/27/2022 2:01 PM Procedure Note  Spritzer, Leonette Most, MD - 08/27/2022 Formatting of this note might be different from the original. MRI HIP LEFT WITHOUT CONTRAST MRI HIP RIGHT WITHOUT CONTRAST  Indication: Hip pain, chronic, tendon/ligament abnormality suspected, x-ray done, M76.891 Other specified enthesopathies of right lower limb, excluding foot, X52.841 Other specified enthesopathies of left lower limb, excluding foot 89 y.o. female patient s/p right trochanteric bursectomy and IT band release on 10/29/2017.  She has had some worsening of her persistent right sided lateral hip pain.  Prior right trochanteric bursa corticosteroid injection gave her excellent relief for approximately 4 months.  She appears to be having bilateral trochanteric pain syndrome that did not respond to most recent injections, although previous injections did help.   Comparison(s): Hip and pelvis radiographs 02/16/2019. Right hip MRI 10/19/2017.  Technique: Noncontrast multiplanar, multiecho imaging of the right hip and pelvis was performed, including T1-weighted and fluid sensitive  sequences. Noncontrast multiplanar, multiecho imaging of the left hip and pelvis was performed, including T1-weighted and fluid sensitive sequences.     FINDINGS:  RIGHT: Marrow: No fracture or femoral head AVN.  Hip joint: Partial-thickness cartilage loss is present across the femoral head as well as the peripheral superior acetabulum. Small femoral head marginal osteophytes are noted. There is degeneration and tearing of the anterior superior labrum. There is a tear of the posterior labrum with a small para labral cyst (8:13; 6:10). No malalignment. No effusion.  Tendons: *  High-grade tearing of the right hamstring tendon origin. No reactive marrow change in the adjacent ischium. The findings have progressed from prior. *   Mild edema like signal within the right gluteus minimus muscle without tendon tear. Right gluteus medius tendon is intact. *  There is tendinosis and likely partial tearing of the iliopsoas tendon near the femoral attachment. *  There is associated scar posteriorly in keeping with patient's history of prior open bursectomy. There is trace trochanteric bursal fluid. *  Small amount of fluid is noted in the right rectus femoris bursa. Small amount of greater trochanteric bursal  fluid.  Soft tissues: *  Mild edema like signal in the ischiofemoral space. *  There is atrophy with fatty infiltration of the gluteus minimus muscle, progressed from prior.  Other: No additional findings.  LEFT: Marrow: No fracture or femoral head AVN.  Visualized SI joints and pubic symphysis: Mild arthrosis at the bilateral SI joints and pubic symphysis.  Hip joint: Partial-thickness cartilage loss along the superior lateral femoral head likely superior lateral acetabulum.. Although not optimally assessed, there is a tear of the anterior superior labrum.  No malalignment. No effusion.  Tendons: Partial-thickness tearing along the anterior aspect of the left gluteus medias  tendon at its femoral attachment . Signal abnormality along the gluteus maximus myotendinous junction (series 8 image 19) suggestive of tendinosis versus tearing. Gluteus minimus tendon is intact. Rectus femoris and iliopsoas tendons are intact. Partial thickness tearing is also present at the left hamstring tendon origin.  Soft tissues: Increased edema and muscle atrophy suggestive of ischiofemoral impingement.  Other: Lower lumbar spondylosis and facet arthropathy. Small volume pelvic free fluid is less likely physiologic.   IMPRESSION:   Right hip: 1.  Osteoarthritis of the right hip. 2.  High-grade partial-thickness hamstring tendon origin tearing. 3.  Mild edema within the gluteus minimus muscle belly suggests low-grade strain. 4.  Small volume fluid within the right rectus femoris bursa. 5.  Status post right open peritrochanteric bursectomy and IT band release. 6.  Tendinosis and likely partial tearing of the iliopsoas tendon near its femoral attachment. 7.  Mild edema in the ischiofemoral space as can be seen with ischiofemoral impingement in the correct clinical scenario.  Left hip: 1.  Partial-thickness tear of the gluteus medius tendon insertion. 2.  Gluteus maximus myotendinous junction signal abnormality suggestive of tendinosis versus low-grade tearing. 3.  Partial thickness tearing at the left hamstring tendon origin. 4.  Low-grade left hip chondral abnormalities with an associated anterior superior labral tear.    Complexity Note: Imaging results reviewed.                         ROS  Cardiovascular: High blood pressure Pulmonary or Respiratory: No reported pulmonary signs or symptoms such as wheezing and difficulty taking a deep full breath (Asthma), difficulty blowing air out (Emphysema), coughing up mucus (Bronchitis), persistent dry cough, or temporary stoppage of breathing during sleep Neurological: No reported neurological signs or symptoms such as  seizures, abnormal skin sensations, urinary and/or fecal incontinence, being born with an abnormal open spine and/or a tethered spinal cord Psychological-Psychiatric: No reported psychological or psychiatric signs or symptoms such as difficulty sleeping, anxiety, depression, delusions or hallucinations (schizophrenial), mood swings (bipolar disorders) or suicidal ideations or attempts Gastrointestinal: Inflamed liver (Hepatitis)as a child Genitourinary: No reported renal or genitourinary signs or symptoms such as difficulty voiding or producing urine, peeing blood, non-functioning kidney, kidney stones, difficulty emptying the bladder, difficulty controlling the flow of urine, or chronic kidney disease Hematological: No reported hematological signs or symptoms such as prolonged bleeding, low or poor functioning platelets, bruising or bleeding easily, hereditary bleeding problems, low energy levels due to low hemoglobin or being anemic Endocrine: No reported endocrine signs or symptoms such as high or low blood sugar, rapid heart rate due to high thyroid levels, obesity or weight gain due to slow thyroid or thyroid disease Rheumatologic: No reported rheumatological signs and symptoms such as fatigue, joint pain, tenderness, swelling, redness, heat, stiffness, decreased range of motion, with or without associated rash Musculoskeletal:  Negative for myasthenia gravis, muscular dystrophy, multiple sclerosis or malignant hyperthermia Work History: Retired  Allergies  Ms. Bettes is allergic to tylenol [acetaminophen].  Laboratory Chemistry Profile   Renal Lab Results  Component Value Date   BUN 16 02/02/2022   CREATININE 0.97 02/02/2022   BCR 16 02/02/2022   GFRAA 60 01/23/2020   GFRNONAA 52 (L) 01/23/2020   SPECGRAV 1.025 02/02/2022   PHUR 5.0 02/02/2022   PROTEINUR Negative 02/02/2022     Electrolytes Lab Results  Component Value Date   NA 144 02/02/2022   K 4.3 02/02/2022   CL 105  02/02/2022   CALCIUM 10.1 02/02/2022     Hepatic Lab Results  Component Value Date   AST 26 02/02/2022   ALT 17 02/02/2022   ALBUMIN 4.4 02/02/2022   ALKPHOS 91 02/02/2022   LIPASE 39 05/28/2017     ID Lab Results  Component Value Date   SARSCOV2NAA POSITIVE (A) 03/20/2021     Bone Lab Results  Component Value Date   VD25OH 45.1 02/02/2022     Endocrine Lab Results  Component Value Date   GLUCOSE 93 02/02/2022   GLUCOSEU NEGATIVE 11/01/2017   TSH 0.64 04/03/2020     Neuropathy No results found for: "VITAMINB12", "FOLATE", "HGBA1C", "HIV"   CNS No results found for: "COLORCSF", "APPEARCSF", "RBCCOUNTCSF", "WBCCSF", "POLYSCSF", "LYMPHSCSF", "EOSCSF", "PROTEINCSF", "GLUCCSF", "JCVIRUS", "CSFOLI", "IGGCSF", "LABACHR", "ACETBL"   Inflammation (CRP: Acute  ESR: Chronic) No results found for: "CRP", "ESRSEDRATE", "LATICACIDVEN"   Rheumatology No results found for: "RF", "ANA", "LABURIC", "URICUR", "LYMEIGGIGMAB", "LYMEABIGMQN", "HLAB27"   Coagulation Lab Results  Component Value Date   PLT 318 02/02/2022     Cardiovascular Lab Results  Component Value Date   TROPONINI <0.03 11/01/2017   HGB 14.0 02/02/2022   HCT 41.9 02/02/2022     Screening Lab Results  Component Value Date   SARSCOV2NAA POSITIVE (A) 03/20/2021     Cancer Lab Results  Component Value Date   CA125 16.9 08/19/2011     Allergens No results found for: "ALMOND", "APPLE", "ASPARAGUS", "AVOCADO", "BANANA", "BARLEY", "BASIL", "BAYLEAF", "GREENBEAN", "LIMABEAN", "WHITEBEAN", "BEEFIGE", "REDBEET", "BLUEBERRY", "BROCCOLI", "CABBAGE", "MELON", "CARROT", "CASEIN", "CASHEWNUT", "CAULIFLOWER", "CELERY"     Note: Lab results reviewed.  PFSH  Drug: [name]  reports no history of drug use. Alcohol:  reports current alcohol use of about 3.0 standard drinks of alcohol per week. Tobacco:  reports that she has never smoked. She has never used smokeless tobacco. 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 Family: family history includes Breast cancer in her paternal aunt; Dementia in her mother; Diabetes in her sister; Heart disease in her brother and father; Hypertension in her father and mother; Mental illness in her mother; Stroke in her brother.  Past Surgical History:  Procedure Laterality Date   ABDOMINAL HYSTERECTOMY     ACOUSTIC NEUROMA RESECTION Right 1985   APPENDECTOMY     BRAIN SURGERY  09/08/2012   burr hole with biopsy; cranial tongs caliper/stereotactic frame right (Dr. Ladoris Gene)   BREAST EXCISIONAL BIOPSY Left  benign   BREAST EXCISIONAL BIOPSY Left    benign   BREAST SURGERY Left    lumpectomy   CATARACT EXTRACTION Bilateral    right eye several years ago; left eye fall 2021   COLON SURGERY     EXCISION/RELEASE BURSA HIP Right 10/29/2017   Procedure: EXCISION/RELEASE BURSA HIP- IT BAND;  Surgeon: Signa Kell, MD;  Location: [facility] ORS;  Service: Orthopedics;  Laterality: Right;   EYE SURGERY Right    cataract   FRACTURE SURGERY Right    shoulder   PARATHYROIDECTOMY  2017   partial for hyperparathyroidism   SHOULDER ARTHROSCOPY W/ ACROMIAL REPAIR Right 28yrs ago   TUBAL LIGATION     VENTRICULOPERITONEAL SHUNT Right 09/08/2012   for NPH   Active  Ambulatory Problems    Diagnosis Date Noted   History of acoustic neuroma 01/20/2010   Colon polyp 01/20/2010   Dependent edema 01/20/2010   Abnormal gait 08/26/2012   Hyperlipidemia, mixed 08/13/2014   Essential (primary) hypertension 01/20/2010   Arthritis of knee, left 01/20/2010   Goiter, nontoxic, multinodular 05/23/2012   Normal pressure hydrocephalus (HCC) 11/05/2012   Osteopenia 01/20/2010   Vitamin D deficiency 08/13/2014   Ventricular shunt in place 08/13/2014   Esophageal reflux 08/13/2014   History of parathyroidectomy 07/10/2015   Pseudophakia of right eye 12/12/2015   Hyperthyroidism, subclinical 04/22/2015   Trochanteric bursitis of right hip 01/12/2017   Insomnia 07/12/2017   Benign essential tremor 03/10/2018   Encounter for long-term (current) use of NSAIDs 05/26/2019   Slow transit constipation 01/23/2020   Dysphagia 06/09/2022   CKD stage 3a, GFR 45-59 ml/min (HCC) 08/04/2022   Enthesopathy of hip region on both sides 08/04/2022   SI (sacroiliac) joint dysfunction 09/15/2022   Bilateral primary osteoarthritis of hip 09/15/2022   Piriformis syndrome of both sides 09/15/2022   Chronic pain syndrome 09/15/2022   Resolved Ambulatory Problems    Diagnosis Date Noted   Closed fracture of humerus, upper end 01/20/2010   Calcium blood increased 08/13/2014   Hyperparathyroidism, primary (HCC) 08/02/2013   Primary hyperparathyroidism (HCC) 08/02/2013   Acoustic neuroma (HCC) 01/20/2010   Postprocedural hypoparathyroidism (HCC) 08/04/2022   Past Medical History:  Diagnosis Date   Allergy    Bone spur 2019   Cataract 12/06/2019   Fibrocystic breast disease    H/O jaundice    Hakim's syndrome (HCC) 11/05/2012   Hepatitis    Hip pain, chronic, right    HLD (hyperlipidemia) 08/13/2014   Hypertension    Hyperthyroidism    Multiple thyroid nodules    Osteoporosis    Pneumonia 1960's   Thyroid disease    Constitutional Exam  General appearance: Well  nourished, well developed, and well hydrated. In no apparent acute distress Vitals:   09/15/22 0959  BP: (!) 154/87  Pulse: 98  Resp: 16  Temp: (!) 97.1 F (36.2 C)  SpO2: 98%  Weight: 119 lb (54 kg)  Height: 5\' 1"  (1.549 m)   BMI Assessment: Estimated body mass index is 22.48 kg/m as calculated from the following:   Height as of this encounter: 5\' 1"  (1.549 m).   Weight as of this encounter: 119 lb (54 kg).  BMI interpretation table: BMI level Category Range association with higher incidence of chronic pain  <18 kg/m2 Underweight   18.5-24.9 kg/m2 Ideal body weight   25-29.9 kg/m2 Overweight Increased incidence by 20%  30-34.9 kg/m2 Obese (Class I) Increased incidence by 68%  35-39.9 kg/m2 Severe obesity (Class II) Increased incidence by  136%  >40 kg/m2 Extreme obesity (Class III) Increased incidence by 254%   Patient's current BMI Ideal Body weight  Body mass index is 22.48 kg/m. Ideal body weight: 47.8 kg (105 lb 6.1 oz) Adjusted ideal body weight: 50.3 kg (110 lb 13.2 oz)   BMI Readings from Last 4 Encounters:  09/15/22 22.48 kg/m  08/04/22 19.85 kg/m  06/08/22 19.85 kg/m  05/20/22 19.85 kg/m   Wt Readings from Last 4 Encounters:  09/15/22 119 lb (54 kg)  08/04/22 123 lb (55.8 kg)  06/08/22 123 lb (55.8 kg)  05/20/22 123 lb (55.8 kg)    Psych/Mental status: Alert, oriented x 3 (person, place, & time)       Eyes: PERLA Respiratory: No evidence of acute respiratory distress  Lumbar Spine Area Exam  Skin & Axial Inspection: No masses, redness, or swelling Alignment: Symmetrical Functional ROM: Unrestricted ROM       Stability: No instability detected Muscle Tone/Strength: Functionally intact. No obvious neuro-muscular anomalies detected. Sensory (Neurological): Unimpaired Palpation: No palpable anomalies       Provocative Tests:  Patrick's Maneuver: (+) for bilateral S-I arthralgia             FABER* test: (+) for bilateral S-I arthralgia S-I anterior  distraction/compression test: (+) for bilateral S-I arthralgia S-I lateral compression test: (+) for bilateral S-I arthralgia S-I Thigh-thrust test: (+) for bilateral S-I arthralgia S-I Gaenslen's test: (+) for bilateral S-I arthralgia *(Flexion, ABduction and External Rotation) Lower Extremity Exam    Side: Right lower extremity  Side: Left lower extremity  Stability: No instability observed          Stability: No instability observed          Skin & Extremity Inspection: Skin color, temperature, and hair growth are WNL. No peripheral edema or cyanosis. No masses, redness, swelling, asymmetry, or associated skin lesions. No contractures.  Skin & Extremity Inspection: Skin color, temperature, and hair growth are WNL. No peripheral edema or cyanosis. No masses, redness, swelling, asymmetry, or associated skin lesions. No contractures.  Functional ROM: Unrestricted ROM                  Functional ROM: Unrestricted ROM                  Muscle Tone/Strength: Functionally intact. No obvious neuro-muscular anomalies detected.  Muscle Tone/Strength: Functionally intact. No obvious neuro-muscular anomalies detected.  Sensory (Neurological): Unimpaired        Sensory (Neurological): Unimpaired        DTR: Patellar: deferred today Achilles: deferred today Plantar: deferred today  DTR: Patellar: deferred today Achilles: deferred today Plantar: deferred today  Palpation: No palpable anomalies  Palpation: No palpable anomalies    Assessment  Primary Diagnosis & Pertinent Problem List: The primary encounter diagnosis was SI joint arthritis. Diagnoses of SI (sacroiliac) joint dysfunction, Bilateral primary osteoarthritis of hip, Piriformis syndrome of both sides, and Chronic pain syndrome were also pertinent to this visit.  Visit Diagnosis (New problems to examiner): 1. SI joint arthritis   2. SI (sacroiliac) joint dysfunction   3. Bilateral primary osteoarthritis of hip   4. Piriformis syndrome of  both sides   5. Chronic pain syndrome    Plan of Care (Initial workup plan)  MRI of right hip shows high-grade partial-thickness hamstring tear along with gluteus minimus low-grade strain along with tendinosis and partial tearing of the iliopsoas tendon near femoral attachment.  For the left hip, she has a partial-thickness tear of  the gluteus medius tendon and a partial thickness tear of the left hamstring tendon.  She also has mild SI joint arthritis.  Positive physical exam findings consistent with SI joint dysfunction and piriformis syndrome.  We discussed a diagnostic bilateral SI joint and piriformis injection.  Risk and benefits of this were reviewed and patient would like to proceed.  Will also obtain a baseline urine toxicology screen for medication management purposes if she would like her tramadol continued here.  Lab Orders         Compliance Drug Analysis, Ur     Procedure Orders         SACROILIAC JOINT INJECTION         TRIGGER POINT INJECTION      Provider-requested follow-up: Return in about 8 days (around 09/23/2022) for B/L SI-J and Piriformis TPI , in clinic [facility].  Future Appointments  Date Time Provider Department Center  09/23/2022  1:20 PM Edward Jolly, MD [facility] None  02/04/2023  9:20 AM Reubin Milan, MD [facility]  05/26/2023  9:15 AM [facility]-ANNUAL WELLNESS VISIT [facility]    Duration of encounter: .  Total time on encounter, as per AMA guidelines included both the face-to-face and non-face-to-face time personally spent by the physician and/or other qualified health care professional(s) on the day of the encounter (includes time in activities that require the physician or other qualified health care professional and does not include time in activities normally performed by clinical staff). Physician's time may include the following activities when performed: Preparing to see the patient (e.g., pre-charting review of records, searching for previously  ordered imaging, lab work, and nerve conduction tests) Review of prior analgesic pharmacotherapies. Reviewing PMP Interpreting ordered tests (e.g., lab work, imaging, nerve conduction tests) Performing post-procedure evaluations, including interpretation of diagnostic procedures Obtaining and/or reviewing separately obtained history Performing a medically appropriate examination and/or evaluation Counseling and educating the patient/family/caregiver Ordering medications, tests, or procedures Referring and communicating with other health care professionals (when not separately reported) Documenting clinical information in the electronic or other health record Independently interpreting results (not separately reported) and communicating results to the patient/ family/caregiver Care coordination (not separately reported)  Note by: Edward Jolly, MD (TTS technology used. I apologize for any typographical errors that were not detected and corrected.) Date: 09/15/2022; Time: 2:12 PM

## 2022-09-16 ENCOUNTER — Other Ambulatory Visit: Payer: Self-pay | Admitting: Internal Medicine

## 2022-09-17 ENCOUNTER — Ambulatory Visit (INDEPENDENT_AMBULATORY_CARE_PROVIDER_SITE_OTHER): Payer: Medicare Other | Admitting: Family Medicine

## 2022-09-17 ENCOUNTER — Ambulatory Visit: Payer: Self-pay

## 2022-09-17 ENCOUNTER — Encounter: Payer: Self-pay | Admitting: Family Medicine

## 2022-09-17 VITALS — BP 128/78 | HR 88 | Ht 61.0 in | Wt 119.0 lb

## 2022-09-17 DIAGNOSIS — M76891 Other specified enthesopathies of right lower limb, excluding foot: Secondary | ICD-10-CM | POA: Diagnosis not present

## 2022-09-17 DIAGNOSIS — R609 Edema, unspecified: Secondary | ICD-10-CM

## 2022-09-17 DIAGNOSIS — M76892 Other specified enthesopathies of left lower limb, excluding foot: Secondary | ICD-10-CM | POA: Diagnosis not present

## 2022-09-17 NOTE — Progress Notes (Signed)
Date:  09/17/2022   Name:  Danielle Herrera   DOB:  08-20-1933   MRN:  [id number]   Chief Complaint: Ankle Pain (Stumped toe x 2 weeks ago- ankle hurts, no pain in leg)  Patient is a 89year old female who presents for a bilateral leg swelling issue/exam. The patient reports the following problems: Neighbors noted ankle swelling which has been a long-term concern.. Health maintenance has been reviewed up-to-date.  Patient is currently on a daily dosing of furosemide 20 mg which she has had called in for refills and she will continue at this current dosing.  Patient does not take NSAIDs which could contribute to generalized edema.  This all seems to be localized in the lower legs primarily in the ankles.      Lab Results  Component Value Date   NA 144 02/02/2022   K 4.3 02/02/2022   CO2 22 02/02/2022   GLUCOSE 93 02/02/2022   BUN 16 02/02/2022   CREATININE 0.97 02/02/2022   CALCIUM 10.1 02/02/2022   EGFR 57 (L) 02/02/2022   GFRNONAA 52 (L) 01/23/2020   Lab Results  Component Value Date   CHOL 270 (H) 02/02/2022   HDL 80 02/02/2022   LDLCALC 173 (H) 02/02/2022   TRIG 101 02/02/2022   CHOLHDL 3.4 02/02/2022   Lab Results  Component Value Date   TSH 0.64 04/03/2020   No results found for: "HGBA1C" Lab Results  Component Value Date   WBC 6.7 02/02/2022   HGB 14.0 02/02/2022   HCT 41.9 02/02/2022   MCV 88 02/02/2022   PLT 318 02/02/2022   Lab Results  Component Value Date   ALT 17 02/02/2022   AST 26 02/02/2022   ALKPHOS 91 02/02/2022   BILITOT 0.3 02/02/2022   Lab Results  Component Value Date   VD25OH 45.1 02/02/2022     Review of Systems  Respiratory:  Negative for cough, chest tightness, shortness of breath and wheezing.   Cardiovascular:  Negative for chest pain and palpitations.  Gastrointestinal:  Negative for abdominal distention.  Genitourinary:  Negative for difficulty urinating.  Musculoskeletal:  Negative for arthralgias, myalgias and neck  stiffness.    Patient Active Problem List   Diagnosis Date Noted   SI (sacroiliac) joint dysfunction 09/15/2022   Bilateral primary osteoarthritis of hip 09/15/2022   Piriformis syndrome of both sides 09/15/2022   Chronic pain syndrome 09/15/2022   CKD stage 3a, GFR 45-59 ml/min (HCC) 08/04/2022   Enthesopathy of hip region on both sides 08/04/2022   Dysphagia 06/09/2022   Slow transit constipation 01/23/2020   Encounter for long-term (current) use of NSAIDs 05/26/2019   Benign essential tremor 03/10/2018   Insomnia 07/12/2017   Trochanteric bursitis of right hip 01/12/2017   Pseudophakia of right eye 12/12/2015   History of parathyroidectomy 07/10/2015   Hyperthyroidism, subclinical 04/22/2015   Hyperlipidemia, mixed 08/13/2014   Vitamin D deficiency 08/13/2014   Ventricular shunt in place 08/13/2014   Esophageal reflux 08/13/2014   Normal pressure hydrocephalus (HCC) 11/05/2012   Abnormal gait 08/26/2012   Goiter, nontoxic, multinodular 05/23/2012   History of acoustic neuroma 01/20/2010   Colon polyp 01/20/2010   Dependent edema 01/20/2010   Essential (primary) hypertension 01/20/2010   Arthritis of knee, left 01/20/2010   Osteopenia 01/20/2010    Allergies  Allergen Reactions   Tylenol [Acetaminophen] Other (See Comments)    Elevated LFTs    Past Surgical History:  Procedure Laterality Date   ABDOMINAL HYSTERECTOMY  ACOUSTIC NEUROMA RESECTION Right 1985   APPENDECTOMY     BRAIN SURGERY  09/08/2012   burr hole with biopsy; cranial tongs caliper/stereotactic frame right (Dr. Ladoris Gene)   BREAST EXCISIONAL BIOPSY Left    benign   BREAST EXCISIONAL BIOPSY Left    benign   BREAST SURGERY Left    lumpectomy   CATARACT EXTRACTION Bilateral    right eye several years ago; left eye fall 2021   COLON SURGERY     EXCISION/RELEASE BURSA HIP Right 10/29/2017   Procedure: EXCISION/RELEASE BURSA HIP- IT BAND;  Surgeon: Signa Kell, MD;  Location: [facility];   Service: Orthopedics;  Laterality: Right;   EYE SURGERY Right    cataract   FRACTURE SURGERY Right    shoulder   PARATHYROIDECTOMY  2017   partial for hyperparathyroidism   SHOULDER ARTHROSCOPY W/ ACROMIAL REPAIR Right 71yrs ago   TUBAL LIGATION     VENTRICULOPERITONEAL SHUNT Right 09/08/2012   for NPH    Social History   Tobacco Use   Smoking status: Never   Smokeless tobacco: Never  Vaping Use   Vaping status: Never Used  Substance Use Topics   Alcohol use: Yes    Alcohol/week: 3.0 standard drinks of alcohol    Types: 3 Glasses of wine per week    Comment: Occasionally   Drug use: Never     Medication list has been reviewed and updated.  Current Meds  Medication Sig   amLODipine (NORVASC) 10 MG tablet TAKE 1 TABLET BY MOUTH EVERY DAY   cholecalciferol (VITAMIN D3) 25 MCG (1000 UNIT) tablet Take 1,000 Units by mouth daily.   furosemide (LASIX) 20 MG tablet TAKE 1 TABLET BY MOUTH EVERY DAY   lisinopril (ZESTRIL) 10 MG tablet TAKE 1 TABLET BY MOUTH EVERY DAY   methimazole (TAPAZOLE) 5 MG tablet Take 5 mg by mouth daily. Dr. Alben Spittle- [facility].   Multiple Vitamin (MULTIVITAMIN WITH MINERALS) TABS tablet Take 1 tablet by mouth daily. Centrum Silver for Women 50+   Multiple Vitamins-Minerals (PRESERVISION AREDS 2 PO) Take 1 each by mouth 2 (two) times daily.   naproxen sodium (ALEVE) 220 MG tablet Take 4 tablets by mouth daily.   sennosides-docusate sodium (SENOKOT-S) 8.6-50 MG tablet Take 2 tablets by mouth 2 (two) times daily.    traMADol (ULTRAM) 50 MG tablet Take 50 mg by mouth every 6 (six) hours as needed for moderate pain.       09/17/2022    4:24 PM 08/04/2022   11:01 AM 02/02/2022    9:50 AM 10/31/2021    1:24 PM  GAD 7 : Generalized Anxiety Score  Nervous, Anxious, on Edge 0 0 0 0  Control/stop worrying 0 0 0 0  Worry too much - different things 0 0 0 0  Trouble relaxing 0 0 0 0  Restless 0 0 0 0  Easily annoyed or irritable 0 0 0 0  Afraid - awful might  happen 0 0 0 0  Total GAD 7 Score 0 0 0 0  Anxiety Difficulty Not difficult at all Not difficult at all Not difficult at all Not difficult at all       09/17/2022    4:24 PM 09/15/2022   10:05 AM 08/04/2022   11:01 AM  Depression screen PHQ 2/9  Decreased Interest 0 0 0  Down, Depressed, Hopeless 0 0 1  PHQ - 2 Score 0 0 1  Altered sleeping 0  0  Tired, decreased energy 0  0  Change in appetite 0  0  Feeling bad or failure about yourself  0  0  Trouble concentrating 0  0  Moving slowly or fidgety/restless 0  0  Suicidal thoughts 0  0  PHQ-9 Score 0  1  Difficult doing work/chores Not difficult at all  Not difficult at all    BP Readings from Last 3 Encounters:  09/17/22 128/78  09/15/22 (!) 154/87  08/04/22 126/70    Physical Exam Vitals and nursing note reviewed.  HENT:     Right Ear: Tympanic membrane normal.     Left Ear: Tympanic membrane normal.     Mouth/Throat:     Mouth: Mucous membranes are dry.  Cardiovascular:     Rate and Rhythm: Normal rate and regular rhythm.     Heart sounds: Normal heart sounds. No murmur heard.    No friction rub. No gallop.  Pulmonary:     Breath sounds: No wheezing, rhonchi or rales.  Abdominal:     Palpations: There is no hepatomegaly or splenomegaly.     Tenderness: There is no abdominal tenderness.  Musculoskeletal:     Comments: Bilateral soft with mild tenderness to deep palpation but no palpable vein and no significant swelling of the calves to suggest DVT.  Neurological:     Mental Status: She is alert.     Wt Readings from Last 3 Encounters:  09/17/22 119 lb (54 kg)  09/15/22 119 lb (54 kg)  08/04/22 123 lb (55.8 kg)    BP 128/78   Pulse 88   Ht 5\' 1"  (1.549 m)   Wt 119 lb (54 kg)   SpO2 96%   BMI 22.48 kg/m   Assessment and Plan: 1. Dependent edema Chronic.  Degree of swelling is episodic depending on activity and ability to elevate legs.  Examination is not consistent with either cellulitis or DVT.   Varicosities noted superficially but not palpable on deep palpation.  This seems to be most consistent with dependent edema and we would prefer not to increase current dosing of furosemide from 20 mg.  In fact mucous membranes are somewhat dry and would benefit from some hydration from an overall status.  Elevating legs get night with some compression hose to the lower extremities during the day.  Encouraging walking to exercise calf muscles to assist with venous return.  And avoid long periods of sitting.  We briefly touched on having vein and vascular formally evaluated but she would like to try elevation and her personal compression means at home at this time.  I have also encouraged that the patient not take over-the-counter NSAIDs as that this may also contribute to the overall swelling.    Elizabeth Sauer, MD

## 2022-09-17 NOTE — Telephone Encounter (Addendum)
  Chief Complaint: bilateral lower leg swelling  Symptoms: no piting  Frequency: 2 days  Pertinent Negatives: Patient denies SOB, chest pain, redness or warmth or pain at swelling site Disposition: [] ED /[] Urgent Care (no appt availability in office) / [x] Appointment(In office/virtual)/ []  [facility]/ [] Home Care/ [] Refused Recommended Disposition /[] [facility]/ []  Follow-up with PCP Additional Notes: no appts with PCP- scheduled same day appt with Dr. Yetta Barre Reason for Disposition  [1] MODERATE leg swelling (e.g., swelling extends up to knees) AND [2] new-onset or worsening  Answer Assessment - Initial Assessment Questions 1. ONSET: "When did the swelling start?" (e.g., minutes, hours, days)     2 days  2. LOCATION: "What part of the leg is swollen?"  "Are both legs swollen or just one leg?"     Knees down  3. SEVERITY: "How bad is the swelling?" (e.g., localized; mild, moderate, severe)   - Localized: Small area of swelling localized to one leg.   - MILD pedal edema: Swelling limited to foot and ankle, pitting edema < 1/4 inch (6 mm) deep, rest and elevation eliminate most or all swelling.   - MODERATE edema: Swelling of lower leg to knee, pitting edema > 1/4 inch (6 mm) deep, rest and elevation only partially reduce swelling.   - SEVERE edema: Swelling extends above knee, facial or hand swelling present.      moderate 4. REDNESS: "Does the swelling look red or infected?"     Ankle blue veins- notmal  5. PAIN: "Is the swelling painful to touch?" If Yes, ask: "How painful is it?"   (Scale 1-10; mild, moderate or severe)     no 6. FEVER: "Do you have a fever?" If Yes, ask: "What is it, how was it measured, and when did it start?"      No fever 7. CAUSE: "What do you think is causing the leg swelling?"     ? Hip  8. MEDICAL HISTORY: "Do you have a history of blood clots (e.g., DVT), cancer, heart failure, kidney disease, or liver failure?"     no 9. RECURRENT  SYMPTOM: "Have you had leg swelling before?" If Yes, ask: "When was the last time?" "What happened that time?"     Yes years  10. OTHER SYMPTOMS: "Do you have any other symptoms?" (e.g., chest pain, difficulty breathing)       no  Protocols used: Leg Swelling and Edema-A-AH

## 2022-09-18 LAB — COMPLIANCE DRUG ANALYSIS, UR

## 2022-09-22 DIAGNOSIS — M9903 Segmental and somatic dysfunction of lumbar region: Secondary | ICD-10-CM | POA: Diagnosis not present

## 2022-09-22 DIAGNOSIS — M955 Acquired deformity of pelvis: Secondary | ICD-10-CM | POA: Diagnosis not present

## 2022-09-22 DIAGNOSIS — M9902 Segmental and somatic dysfunction of thoracic region: Secondary | ICD-10-CM | POA: Diagnosis not present

## 2022-09-22 DIAGNOSIS — M5442 Lumbago with sciatica, left side: Secondary | ICD-10-CM | POA: Diagnosis not present

## 2022-09-22 DIAGNOSIS — M546 Pain in thoracic spine: Secondary | ICD-10-CM | POA: Diagnosis not present

## 2022-09-22 DIAGNOSIS — M9905 Segmental and somatic dysfunction of pelvic region: Secondary | ICD-10-CM | POA: Diagnosis not present

## 2022-09-23 ENCOUNTER — Encounter: Payer: Self-pay | Admitting: Student in an Organized Health Care Education/Training Program

## 2022-09-23 ENCOUNTER — Ambulatory Visit
Admission: RE | Admit: 2022-09-23 | Discharge: 2022-09-23 | Disposition: A | Discharge status: Home / Self Care | Payer: Medicare Other | Source: Ambulatory Visit | Attending: Student in an Organized Health Care Education/Training Program | Admitting: Student in an Organized Health Care Education/Training Program

## 2022-09-23 ENCOUNTER — Ambulatory Visit
Payer: Medicare Other | Attending: Student in an Organized Health Care Education/Training Program | Admitting: Student in an Organized Health Care Education/Training Program

## 2022-09-23 VITALS — BP 126/86 | HR 87 | Temp 97.2°F | Resp 19 | Ht 61.0 in | Wt 121.0 lb

## 2022-09-23 DIAGNOSIS — Z79899 Other long term (current) drug therapy: Secondary | ICD-10-CM | POA: Diagnosis not present

## 2022-09-23 DIAGNOSIS — G894 Chronic pain syndrome: Secondary | ICD-10-CM | POA: Insufficient documentation

## 2022-09-23 DIAGNOSIS — M160 Bilateral primary osteoarthritis of hip: Secondary | ICD-10-CM | POA: Diagnosis not present

## 2022-09-23 DIAGNOSIS — M533 Sacrococcygeal disorders, not elsewhere classified: Secondary | ICD-10-CM

## 2022-09-23 DIAGNOSIS — G5703 Lesion of sciatic nerve, bilateral lower limbs: Secondary | ICD-10-CM | POA: Insufficient documentation

## 2022-09-23 DIAGNOSIS — M47818 Spondylosis without myelopathy or radiculopathy, sacral and sacrococcygeal region: Secondary | ICD-10-CM

## 2022-09-23 DIAGNOSIS — Z9889 Other specified postprocedural states: Secondary | ICD-10-CM | POA: Diagnosis not present

## 2022-09-23 DIAGNOSIS — M461 Sacroiliitis, not elsewhere classified: Secondary | ICD-10-CM | POA: Insufficient documentation

## 2022-09-23 MED ORDER — METHYLPREDNISOLONE ACETATE 80 MG/ML IJ SUSP
80.0000 mg | Freq: Once | INTRAMUSCULAR | Status: AC
  Administered 2022-09-23: 80 mg via INTRA_ARTICULAR
  Filled 2022-09-23: qty 1

## 2022-09-23 MED ORDER — DEXAMETHASONE SODIUM PHOSPHATE 10 MG/ML IJ SOLN
10.0000 mg | Freq: Once | INTRAMUSCULAR | Status: AC
  Administered 2022-09-23: 10 mg
  Filled 2022-09-23: qty 1

## 2022-09-23 MED ORDER — ROPIVACAINE HCL 2 MG/ML IJ SOLN
9.0000 mL | Freq: Once | INTRAMUSCULAR | Status: AC
  Administered 2022-09-23: 9 mL via INTRA_ARTICULAR
  Filled 2022-09-23: qty 20

## 2022-09-23 MED ORDER — IOHEXOL 180 MG/ML  SOLN
10.0000 mL | Freq: Once | INTRAMUSCULAR | Status: AC
  Administered 2022-09-23: 10 mL via INTRA_ARTICULAR
  Filled 2022-09-23: qty 20

## 2022-09-23 MED ORDER — LIDOCAINE HCL 2 % IJ SOLN
20.0000 mL | Freq: Once | INTRAMUSCULAR | Status: AC
  Administered 2022-09-23: 200 mg
  Filled 2022-09-23: qty 20

## 2022-09-23 MED ORDER — ROPIVACAINE HCL 2 MG/ML IJ SOLN
9.0000 mL | Freq: Once | INTRAMUSCULAR | Status: AC
  Administered 2022-09-23: 9 mL via PERINEURAL

## 2022-09-23 NOTE — Progress Notes (Signed)
PROVIDER NOTE: Interpretation of information contained herein should be left to medically-trained personnel. Specific patient instructions are provided elsewhere under "Patient Instructions" section of medical record. This document was created in part using STT-dictation technology, any transcriptional errors that may result from this process are unintentional.  Patient: Danielle Herrera Divita Type: Established DOB: 03-Feb-1934 MRN: [id number] PCP: Reubin Milan, MD  Service: Procedure DOS: 09/23/2022 Setting: Ambulatory Location: Ambulatory outpatient facility Delivery: Face-to-face Provider: Edward Jolly, MD Specialty: Interventional Pain Management Specialty designation: 09 Location: Outpatient facility Ref. Prov.: Reubin Milan, MD       Interventional Therapy   Procedure: Sacroiliac Joint Steroid Injection #1   and bilateral piriformis TPI Laterality: Bilateral     Level: PIIS (Posterior inferior iliac Spine)  Imaging: Fluoroscopic guidance Anesthesia: Local anesthesia (1-2% Lidocaine) DOS: 09/23/2022  Performed by: Edward Jolly, MD  Purpose: Diagnostic/Therapeutic Indications: Sacroiliac joint pain in the lower back and hip area severe enough to impact quality of life or function. Rationale (medical necessity): procedure needed and proper for the diagnosis and/or treatment of Danielle Herrera medical symptoms and needs. 1. SI joint arthritis   2. SI (sacroiliac) joint dysfunction   3. Bilateral primary osteoarthritis of hip   4. Chronic pain syndrome    NAS-11 Pain score:   Pre-procedure: 4 /10   Post-procedure: 0-No pain/10     Target: Interarticular sacroiliac joint. Location: Medial to the postero-medial edge of iliac spine. Region: Lumbosacral-sacrococcygeal. Approach: Inferior postero-medial percutaneous approach. Type of procedure: Percutaneous joint injection.  Position / Prep / Materials:  Position: Prone  Prep solution: DuraPrep (Iodine Povacrylex [0.7%  available iodine] and Isopropyl Alcohol, 74% w/w) Prep Area: Entire posterior lumbosacral area  Materials:  Tray: Block Needle(s):  Type: Spinal  Gauge (G): 22  Length: 3.5-in Qty: 2  H&P (Pre-op Assessment):  Danielle Herrera is a 89 y.o. (year old), female patient, seen today for interventional treatment. She  has a past surgical history that includes Appendectomy; Acoustic neuroma resection (Right, 1985); Colon surgery; Shoulder arthroscopy w/ acromial repair (Right, 54yrs ago); Tubal ligation; Parathyroidectomy (2017); Ventriculoperitoneal shunt (Right, 09/08/2012); Brain surgery (09/08/2012); Abdominal hysterectomy; Eye surgery (Right); Fracture surgery (Right); Excision/release bursa hip (Right, 10/29/2017); Cataract extraction (Bilateral); Breast excisional biopsy (Left); Breast excisional biopsy (Left); and Breast surgery (Left). Danielle Herrera has a current medication list which includes the following prescription(s): amlodipine, cholecalciferol, furosemide, lisinopril, methimazole, multivitamin with minerals, multiple vitamins-minerals, naproxen sodium, sennosides-docusate sodium, and tramadol. Her primarily concern today is the Back Pain (bilateral)  Initial Vital Signs:  Pulse/HCG Rate: 87ECG Heart Rate: 92 Temp: (!) 97.2 F (36.2 C) Resp: 16 BP: 130/74 SpO2: 99 %  BMI: Estimated body mass index is 22.86 kg/m as calculated from the following:   Height as of this encounter: 5\' 1"  (1.549 m).   Weight as of this encounter: 121 lb (54.9 kg).  Risk Assessment: Allergies: Reviewed. She is allergic to tylenol [acetaminophen].  Allergy Precautions: None required Coagulopathies: Reviewed. None identified.  Blood-thinner therapy: None at this time Active Infection(s): Reviewed. None identified. Danielle Herrera is afebrile  Site Confirmation: Ms. Hark was asked to confirm the procedure and laterality before marking the site Procedure checklist: Completed Consent: Before the procedure and under  the influence of no sedative(s), amnesic(s), or anxiolytics, the patient was informed of the treatment options, risks and possible complications. To fulfill our ethical and legal obligations, as recommended by the American Medical Association's Code of Ethics, I have informed the patient of my clinical impression; the nature and  purpose of the treatment or procedure; the risks, benefits, and possible complications of the intervention; the alternatives, including doing nothing; the risk(s) and benefit(s) of the alternative treatment(s) or procedure(s); and the risk(s) and benefit(s) of doing nothing. The patient was provided information about the general risks and possible complications associated with the procedure. These may include, but are not limited to: failure to achieve desired goals, infection, bleeding, organ or nerve damage, allergic reactions, paralysis, and death. In addition, the patient was informed of those risks and complications associated to the procedure, such as failure to decrease pain; infection; bleeding; organ or nerve damage with subsequent damage to sensory, motor, and/or autonomic systems, resulting in permanent pain, numbness, and/or weakness of one or several areas of the body; allergic reactions; (i.e.: anaphylactic reaction); and/or death. Furthermore, the patient was informed of those risks and complications associated with the medications. These include, but are not limited to: allergic reactions (i.e.: anaphylactic or anaphylactoid reaction(s)); adrenal axis suppression; blood sugar elevation that in diabetics may result in ketoacidosis or comma; water retention that in patients with history of congestive heart failure may result in shortness of breath, pulmonary edema, and decompensation with resultant heart failure; weight gain; swelling or edema; medication-induced neural toxicity; particulate matter embolism and blood vessel occlusion with resultant organ, and/or nervous  system infarction; and/or aseptic necrosis of one or more joints. Finally, the patient was informed that Medicine is not an exact science; therefore, there is also the possibility of unforeseen or unpredictable risks and/or possible complications that may result in a catastrophic outcome. The patient indicated having understood very clearly. We have given the patient no guarantees and we have made no promises. Enough time was given to the patient to ask questions, all of which were answered to the patient's satisfaction. Ms. Edling has indicated that she wanted to continue with the procedure. Attestation: I, the ordering provider, attest that I have discussed with the patient the benefits, risks, side-effects, alternatives, likelihood of achieving goals, and potential problems during recovery for the procedure that I have provided informed consent. Date  Time: 09/23/2022  1:08 PM  Pre-Procedure Preparation:  Monitoring: As per clinic protocol. Respiration, ETCO2, SpO2, BP, heart rate and rhythm monitor placed and checked for adequate function Safety Precautions: Patient was assessed for positional comfort and pressure points before starting the procedure. Time-out: I initiated and conducted the "Time-out" before starting the procedure, as per protocol. The patient was asked to participate by confirming the accuracy of the "Time Out" information. Verification of the correct person, site, and procedure were performed and confirmed by me, the nursing staff, and the patient. "Time-out" conducted as per Joint Commission's Universal Protocol (UP.01.01.01). Time: 1347 Start Time: 1347 hrs.  Description/Narrative of Procedure:          Start Time: 1347 hrs.  Rationale (medical necessity): procedure needed and proper for the diagnosis and/or treatment of the patient's medical symptoms and needs. Procedural Technique Safety Precautions: Aspiration looking for blood return was conducted prior to all injections.  At no point did we inject any substances, as a needle was being advanced. No attempts were made at seeking any paresthesias. Safe injection practices and needle disposal techniques used. Medications properly checked for expiration dates. SDV (single dose vial) medications used. Description of the Procedure: Protocol guidelines were followed. The patient was assisted into a comfortable position. The target area was identified and the area prepped in the usual manner. Skin & deeper tissues infiltrated with local anesthetic. Appropriate amount of  time allowed to pass for local anesthetics to take effect. The procedure needles were then advanced to the target area. Proper needle placement secured. Negative aspiration confirmed. Solution injected in intermittent fashion, asking for systemic symptoms every 0.5cc of injectate. The needles were then removed and the area cleansed, making sure to leave some of the prepping solution back to take advantage of its long term bactericidal properties.  Technical description of procedure:  Fluoroscopy using a posterior anterior 45 degree angle from the midline aiming at the anterolateral aspect of the patient was used to find a direct path into the sacroiliac joint, the superior medial to posterior superior iliac spine.  The skin was marked where the desired target and the skin infiltrated with local anesthetics.  The procedure needle was then advanced until the joint was entered.  Once inside of the joint, we then proceeded to inject the desired solution.  10 cc solution made of 9 cc of 0.2% ropivacaine, 1 cc of methylprednisolone, 80 mg/cc.  5 cc injected into the right SI joint after contrast outline, 5 cc injected into the left SI joint after contrast outline  Afterwards a right and left piriformis trigger point injection was done 1 cm inferior, 1 cm deep, 1 cm lateral to the inferior fissure of the SI joint.  Contrast was injected to confirm piriformis muscle striation.   8 cc solution made of 7 cc of 0.2% ropivacaine, 1 cc of Decadron 10 mg/cc.  4 cc injected into the left piriformis, 4 cc injected into the right piriformis.  While injecting, patient did not complain of any pain radiating down her leg.   Vitals:   09/23/22 1343 09/23/22 1348 09/23/22 1353 09/23/22 1356  BP: (!) 168/96 (!) 157/90 (!) 158/94 126/86  Pulse:      Resp: 15 (!) 23 (!) 21 19  Temp:      TempSrc:      SpO2: 96% 93% 93% 94%  Weight:      Height:         End Time: 1355 hrs.  Imaging Guidance (Non-Spinal):          Type of Imaging Technique: Fluoroscopy Guidance (Non-Spinal) Indication(s): Assistance in needle guidance and placement for procedures requiring needle placement in or near specific anatomical locations not easily accessible without such assistance. Exposure Time: Please see nurses notes. Contrast: Before injecting any contrast, we confirmed that the patient did not have an allergy to iodine, shellfish, or radiological contrast. Once satisfactory needle placement was completed at the desired level, radiological contrast was injected. Contrast injected under live fluoroscopy. No contrast complications. See chart for type and volume of contrast used. Fluoroscopic Guidance: I was personally present during the use of fluoroscopy. "Tunnel Vision Technique" used to obtain the best possible view of the target area. Parallax error corrected before commencing the procedure. "Direction-depth-direction" technique used to introduce the needle under continuous pulsed fluoroscopy. Once target was reached, antero-posterior, oblique, and lateral fluoroscopic projection used confirm needle placement in all planes. Images permanently stored in EMR. Interpretation: I personally interpreted the imaging intraoperatively. Adequate needle placement confirmed in multiple planes. Appropriate spread of contrast into desired area was observed. No evidence of afferent or efferent intravascular uptake.  Permanent images saved into the patient's record.  Post-operative Assessment:  Post-procedure Vital Signs:  Pulse/HCG Rate: 8783 Temp: (!) 97.2 F (36.2 C) Resp: 19 BP: 126/86 SpO2: 94 %  EBL: None  Complications: No immediate post-treatment complications observed by team, or reported by patient.  Note: The patient tolerated the entire procedure well. A repeat set of vitals were taken after the procedure and the patient was kept under observation following institutional policy, for this type of procedure. Post-procedural neurological assessment was performed, showing return to baseline, prior to discharge. The patient was provided with post-procedure discharge instructions, including a section on how to identify potential problems. Should any problems arise concerning this procedure, the patient was given instructions to immediately contact us, at any time, without hesitation. In any case, we plan to contact the patient by telephone for a follow-up status report regarding this interventional procedure.  Comments:  No additional relevant information.  Plan of Care (POC)  Orders:  Orders Placed This Encounter  Procedures   DG PAIN CL[facility] C-ARM 1-60 MIN NO REPORT    Intraoperative interpretation by procedural physician at [facility].    Standing Status:   Standing    Number of Occurrences:   1    Order Specific Question:   Reason for exam:    Answer:   Assistance in needle guidance and placement for procedures requiring needle placement in or near specific anatomical locations not easily accessible without such assistance.   Medications ordered for procedure: Meds ordered this encounter  Medications   lidocaine (XYLOCAINE) 2 % (with pres) injection 400 mg   iohexol (OMNIPAQUE) 180 MG/ML injection 10 mL    Must be Myelogram-compatible. If not available, you may substitute with a water-soluble, non-ionic, hypoallergenic, myelogram-compatible radiological contrast medium.    dexamethasone (DECADRON) injection 10 mg   methylPREDNISolone acetate (DEPO-MEDROL) injection 80 mg   ropivacaine (PF) 2 mg/mL (0.2%) (NAROPIN) injection 9 mL   ropivacaine (PF) 2 mg/mL (0.2%) (NAROPIN) injection 9 mL   Medications administered: We administered lidocaine, iohexol, dexamethasone, methylPREDNISolone acetate, ropivacaine (PF) 2 mg/mL (0.2%), and ropivacaine (PF) 2 mg/mL (0.2%).  See the medical record for exact dosing, route, and time of administration.  Follow-up plan:   Return in about 12 days (around 10/05/2022) for Post Procedure Evaluation + MM, F2F.       Bilateral SI joint and piriformis 09/23/2022    Recent Visits Date Type Provider Dept  09/15/22 Office Visit Edward Jolly, MD [facility]  Showing recent visits within past 90 days and meeting all other requirements Today's Visits Date Type Provider Dept  09/23/22 Procedure visit Edward Jolly, MD [facility]  Showing today's visits and meeting all other requirements Future Appointments Date Type Provider Dept  10/05/22 Appointment Edward Jolly, MD [facility]  Showing future appointments within next 90 days and meeting all other requirements  Disposition: Discharge home  Discharge (Date  Time): 09/23/2022; 1407 hrs.   Primary Care Physician: Reubin Milan, MD Location: [facility] Note by: Edward Jolly, MD (TTS technology used. I apologize for any typographical errors that were not detected and corrected.) Date: 09/23/2022; Time: 3:13 PM  Disclaimer:  Medicine is not an Visual merchandiser. The only guarantee in medicine is that nothing is guaranteed. It is important to note that the decision to proceed with this intervention was based on the information collected from the patient. The Data and conclusions were drawn from the patient's questionnaire, the interview, and the physical examination. Because the information was provided in large part by the  patient, it cannot be guaranteed that it has not been purposely or unconsciously manipulated. Every effort has been made to obtain as much relevant data as possible for this evaluation. It is important to note  that the conclusions that lead to this procedure are derived in large part from the available data. Always take into account that the treatment will also be dependent on availability of resources and existing treatment guidelines, considered by other Pain Management Practitioners as being common knowledge and practice, at the time of the intervention. For Medico-Legal purposes, it is also important to point out that variation in procedural techniques and pharmacological choices are the acceptable norm. The indications, contraindications, technique, and results of the above procedure should only be interpreted and judged by a Board-Certified Interventional Pain Specialist with extensive familiarity and expertise in the same exact procedure and technique.

## 2022-09-23 NOTE — Patient Instructions (Signed)

## 2022-09-24 ENCOUNTER — Telehealth: Payer: Self-pay | Admitting: Student in an Organized Health Care Education/Training Program

## 2022-09-24 ENCOUNTER — Telehealth: Payer: Self-pay | Admitting: *Deleted

## 2022-09-24 ENCOUNTER — Telehealth: Payer: Self-pay

## 2022-09-24 NOTE — Telephone Encounter (Signed)
Attempted to call for post procedure follow-up. Message left. 

## 2022-09-24 NOTE — Telephone Encounter (Signed)
PT states that she is doing well, had an great night of rest. PT states she had an little pain when she got up this morning, but she is well.

## 2022-09-28 ENCOUNTER — Other Ambulatory Visit: Payer: Self-pay | Admitting: Internal Medicine

## 2022-10-05 ENCOUNTER — Ambulatory Visit
Payer: Medicare Other | Attending: Student in an Organized Health Care Education/Training Program | Admitting: Student in an Organized Health Care Education/Training Program

## 2022-10-05 ENCOUNTER — Encounter: Payer: Self-pay | Admitting: Student in an Organized Health Care Education/Training Program

## 2022-10-05 VITALS — BP 156/86 | HR 81 | Temp 97.0°F | Resp 16 | Ht 61.0 in | Wt 119.0 lb

## 2022-10-05 DIAGNOSIS — G5703 Lesion of sciatic nerve, bilateral lower limbs: Secondary | ICD-10-CM | POA: Diagnosis not present

## 2022-10-05 DIAGNOSIS — M533 Sacrococcygeal disorders, not elsewhere classified: Secondary | ICD-10-CM | POA: Diagnosis not present

## 2022-10-05 DIAGNOSIS — M47818 Spondylosis without myelopathy or radiculopathy, sacral and sacrococcygeal region: Secondary | ICD-10-CM | POA: Diagnosis not present

## 2022-10-05 DIAGNOSIS — G894 Chronic pain syndrome: Secondary | ICD-10-CM | POA: Diagnosis not present

## 2022-10-05 DIAGNOSIS — M160 Bilateral primary osteoarthritis of hip: Secondary | ICD-10-CM | POA: Diagnosis not present

## 2022-10-05 MED ORDER — TRAMADOL HCL 50 MG PO TABS: 50.0000 mg | ORAL_TABLET | Freq: Three times a day (TID) | ORAL | 2 refills | Status: DC | PRN

## 2022-10-05 NOTE — Progress Notes (Signed)
Safety precautions to be maintained throughout the outpatient stay will include: orient to surroundings, keep bed in low position, maintain call bell within reach at all times, provide assistance with transfer out of bed and ambulation.  

## 2022-10-05 NOTE — Progress Notes (Signed)
PROVIDER NOTE: Information contained herein reflects review and annotations entered in association with encounter. Interpretation of such information and data should be left to medically-trained personnel. Information provided to patient can be located elsewhere in the medical record under "Patient Instructions". Document created using STT-dictation technology, any transcriptional errors that may result from process are unintentional.    Patient: Danielle Herrera  Service Category: E/M  Provider: Edward Jolly, MD  DOB: Mar 18, 1933  DOS: 10/05/2022  Referring Provider: Reubin Milan, MD  MRN: [id number]  Specialty: Interventional Pain Management  PCP: Reubin Milan, MD  Type: Established Patient  Setting: Ambulatory outpatient    Location: Office  Delivery: Face-to-face     HPI  Ms. Rosena Zeno, a 89 y.o. year old female, is here today because of her SI joint arthritis [M47.818]. Ms. Bodnar primary complain today is Hip Pain (right)  Pertinent problems: Ms. Duh has SI (sacroiliac) joint dysfunction; Bilateral primary osteoarthritis of hip; Piriformis syndrome of both sides; and Chronic pain syndrome on their pertinent problem list. Pain Assessment: Severity of Chronic pain is reported as a 7 /10. Location: Hip Right/denies. Onset: More than a month ago. Quality: Sharp. Timing: Intermittent. Modifying factor(s): sitting. Vitals:  height is 5\' 1"  (1.549 m) and weight is 119 lb (54 kg). Her temperature is 97 F (36.1 C) (abnormal). Her blood pressure is 156/86 (abnormal) and her pulse is 81. Her respiration is 16 and oxygen saturation is 96%.  BMI: Estimated body mass index is 22.48 kg/m as calculated from the following:   Height as of this encounter: 5\' 1"  (1.549 m).   Weight as of this encounter: 119 lb (54 kg). Last encounter: 09/15/2022. Last procedure: 09/23/2022.  Reason for encounter: both, medication management and post-procedure evaluation and assessment.     Post-procedure evaluation   Procedure: Sacroiliac Joint Steroid Injection #1   and bilateral piriformis TPI Laterality: Bilateral     Level: PIIS (Posterior inferior iliac Spine)  Imaging: Fluoroscopic guidance Anesthesia: Local anesthesia (1-2% Lidocaine) DOS: 09/23/2022  Performed by: Edward Jolly, MD  Purpose: Diagnostic/Therapeutic Indications: Sacroiliac joint pain in the lower back and hip area severe enough to impact quality of life or function. Rationale (medical necessity): procedure needed and proper for the diagnosis and/or treatment of Ms. Pinnix medical symptoms and needs. 1. SI joint arthritis   2. SI (sacroiliac) joint dysfunction   3. Bilateral primary osteoarthritis of hip   4. Chronic pain syndrome    NAS-11 Pain score:   Pre-procedure: 4 /10   Post-procedure: 0-No pain/10      Effectiveness:  Initial hour after procedure: 100 %  Subsequent 4-6 hours post-procedure: 100 %  Analgesia past initial 6 hours: 100 % (lasting 1.5 weeks.  Only right hip pain pain has returned.)  Ongoing improvement:  Analgesic:  75% for left hip, buttock, and low back. Return of pain in right hip Function: Ms. Dehoff reports improvement in function for the low back and left hip ROM: Ms. Chuc reports improvement in ROM for the low back and left hip   Pharmacotherapy Assessment  Analgesic: Tramadol 50 mg every 8 hours as needed  Monitoring: [place] PMP: PDMP reviewed during this encounter.       Pharmacotherapy: No side-effects or adverse reactions reported. Compliance: No problems identified. Effectiveness: Clinically acceptable.  Valerie Salts, RN  10/05/2022  2:41 PM  Sign when Signing Visit Safety precautions to be maintained throughout the outpatient stay will include: orient to surroundings, keep bed in low position,  maintain call bell within reach at all times, provide assistance with transfer out of bed and ambulation.    No results found for: "CBDTHCR" No results found  for: "D8THCCBX" No results found for: "D9THCCBX"  UDS:  Summary  Date Value Ref Range Status  09/15/2022 Note  Final    Comment:    ==================================================================== Compliance Drug Analysis, Ur ==================================================================== Test                             Result       Flag       Units  Drug Present and Declared for Prescription Verification   Tramadol                       >5952        EXPECTED   ng/mg creat   O-Desmethyltramadol            >5952        EXPECTED   ng/mg creat   N-Desmethyltramadol            >5952        EXPECTED   ng/mg creat    Source of tramadol is a prescription medication. O-desmethyltramadol    and N-desmethyltramadol are expected metabolites of tramadol.    Naproxen                       PRESENT      EXPECTED ==================================================================== Test                      Result    Flag   Units      Ref Range   Creatinine              84               mg/dL      >=16 ==================================================================== Declared Medications:  The flagging and interpretation on this report are based on the  following declared medications.  Unexpected results may arise from  inaccuracies in the declared medications.   **Note: The testing scope of this panel includes these medications:   Naproxen (Aleve)  Tramadol (Ultram)   **Note: The testing scope of this panel does not include the  following reported medications:   Amlodipine (Norvasc)  Furosemide (Lasix)  Lisinopril (Zestril)  Methimazole (Tapazole)  Multivitamin  Sennosides (Senokot)  Vitamin D3 ==================================================================== For clinical consultation, please call [phone]. ====================================================================       ROS  Constitutional: Denies any fever or chills Gastrointestinal: No reported  hemesis, hematochezia, vomiting, or acute GI distress Musculoskeletal:  Right hip pain Neurological: No reported episodes of acute onset apraxia, aphasia, dysarthria, agnosia, amnesia, paralysis, loss of coordination, or loss of consciousness  Medication Review  Multiple Vitamins-Minerals, amLODipine, cholecalciferol, furosemide, lisinopril, methimazole, multivitamin with minerals, naproxen sodium, sennosides-docusate sodium, and traMADol  History Review  Allergy: Ms. Hellams is allergic to tylenol [acetaminophen]. Drug: Ms. Walch  reports no history of drug use. Alcohol:  reports current alcohol use of about 3.0 standard drinks of alcohol per week. Tobacco:  reports that she has never smoked. She has never used smokeless tobacco. Social: Ms. Bottger  reports that she has never smoked. She has never used smokeless tobacco. She reports current alcohol use of about 3.0 standard drinks of alcohol per week. She reports that she does not use drugs. 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 Surgical: Ms. Wenck  has a past surgical history that includes Appendectomy; Acoustic neuroma resection (Right, 1985); Colon surgery; Shoulder arthroscopy w/ acromial repair (Right, 48yrs ago); Tubal ligation;  Parathyroidectomy (2017); Ventriculoperitoneal shunt (Right, 09/08/2012); Brain surgery (09/08/2012); Abdominal hysterectomy; Eye surgery (Right); Fracture surgery (Right); Excision/release bursa hip (Right, 10/29/2017); Cataract extraction (Bilateral); Breast excisional biopsy (Left); Breast excisional biopsy (Left); and Breast surgery (Left). Family: family history includes Breast cancer in her paternal aunt; Dementia in her mother; Diabetes in her sister; Heart disease in her brother and father; Hypertension in her father and mother; Mental illness in her mother; Stroke in her brother.  Laboratory Chemistry Profile   Renal Lab Results  Component Value Date   BUN 16 02/02/2022   CREATININE 0.97 02/02/2022   BCR 16 02/02/2022   GFRAA 60 01/23/2020   GFRNONAA 52 (L) 01/23/2020    Hepatic Lab Results  Component Value Date   AST 26 02/02/2022   ALT 17 02/02/2022   ALBUMIN 4.4 02/02/2022   ALKPHOS 91 02/02/2022   LIPASE 39 05/28/2017    Electrolytes Lab Results  Component Value Date   NA 144 02/02/2022   K 4.3 02/02/2022   CL 105 02/02/2022   CALCIUM 10.1 02/02/2022    Bone Lab Results  Component Value Date   VD25OH 45.1 02/02/2022    Inflammation (CRP: Acute Phase) (ESR: Chronic Phase) No results found for: "CRP", "ESRSEDRATE", "LATICACIDVEN"       Note: Above Lab results reviewed.  Recent Imaging Review  DG PAIN CLINIC C-ARM 1-60 MIN NO REPORT Fluoro was used, but no Radiologist interpretation will be provided.  Please refer to "NOTES" tab for provider progress note. Note: Reviewed        Physical Exam  General appearance: Well nourished, well developed, and well hydrated. In no apparent acute distress Mental status: Alert, oriented x 3 (person, place, & time)       Respiratory: No evidence of acute respiratory distress Eyes: PERLA Vitals: BP (!) 156/86   Pulse 81   Temp (!) 97 F (36.1 C)   Resp 16   Ht 5\' 1"  (1.549 m)   Wt 119 lb (54 kg)   SpO2 96%   BMI  22.48 kg/m  BMI: Estimated body mass index is 22.48 kg/m as calculated from the following:   Height as of this encounter: 5\' 1"  (1.549 m).   Weight as of this encounter: 119 lb (54 kg). Ideal: Ideal body weight: 47.8 kg (105 lb 6.1 oz) Adjusted ideal body weight: 50.3 kg (110 lb 13.2 oz)  Patient presents today in wheelchair and is having increased right hip and right greater trochanteric pain  Assessment   Diagnosis Status  1. SI joint arthritis   2. SI (sacroiliac) joint dysfunction   3. Bilateral primary osteoarthritis of hip   4. Piriformis syndrome of both sides   5. Chronic pain syndrome    Controlled Controlled Controlled     Plan of Care    Ms. Larrisha White Colarusso has a current medication list which includes the following  long-term medication(s): amlodipine, furosemide, and lisinopril.  Pharmacotherapy (Medications Ordered): Meds ordered this encounter  Medications   traMADol (ULTRAM) 50 MG tablet    Sig: Take 1 tablet (50 mg total) by mouth every 8 (eight) hours as needed for severe pain.    Dispense:  90 tablet    Refill:  2    Fill one day early if pharmacy is closed on scheduled refill date. Do not fill until: To last until:   Repeat SI joint and piriformis injection as needed.  Orders:  No orders of the defined types were placed in this encounter.  Follow-up plan:   Return in about 3 months (around 01/05/2023) for Medication Management, in person.      Bilateral SI joint and piriformis 09/23/2022     Recent Visits Date Type Provider Dept  09/23/22 Procedure visit Edward Jolly, MD [facility]  09/15/22 Office Visit Edward Jolly, MD [facility]  Showing recent visits within past 90 days and meeting all other requirements Today's Visits Date Type Provider Dept  10/05/22 Office Visit Edward Jolly, MD [facility]  Showing today's visits and meeting all other requirements Future Appointments Date Type Provider Dept   12/31/22 Appointment Edward Jolly, MD [facility]  Showing future appointments within next 90 days and meeting all other requirements  I discussed the assessment and treatment plan with the patient. The patient was provided an opportunity to ask questions and all were answered. The patient agreed with the plan and demonstrated an understanding of the instructions.  Patient advised to call back or seek an in-person evaluation if the symptoms or condition worsens.  Duration of encounter: .  Total time on encounter, as per AMA guidelines included both the face-to-face and non-face-to-face time personally spent by the physician and/or other qualified health care professional(s) on the day of the encounter (includes time in activities that require the physician or other qualified health care professional and does not include time in activities normally performed by clinical staff). Physician's time may include the following activities when performed: Preparing to see the patient (e.g., pre-charting review of records, searching for previously ordered imaging, lab work, and nerve conduction tests) Review of prior analgesic pharmacotherapies. Reviewing PMP Interpreting ordered tests (e.g., lab work, imaging, nerve conduction tests) Performing post-procedure evaluations, including interpretation of diagnostic procedures Obtaining and/or reviewing separately obtained history Performing a medically appropriate examination and/or evaluation Counseling and educating the patient/family/caregiver Ordering medications, tests, or procedures Referring and communicating with other health care professionals (when not separately reported) Documenting clinical information in the electronic or other health record Independently interpreting results (not separately reported) and communicating results to the patient/ family/caregiver Care coordination (not separately reported)  Note by: Edward Jolly, MD Date: 10/05/2022; Time: 3:30 PM

## 2022-10-07 DIAGNOSIS — M9903 Segmental and somatic dysfunction of lumbar region: Secondary | ICD-10-CM | POA: Diagnosis not present

## 2022-10-07 DIAGNOSIS — M9902 Segmental and somatic dysfunction of thoracic region: Secondary | ICD-10-CM | POA: Diagnosis not present

## 2022-10-07 DIAGNOSIS — M546 Pain in thoracic spine: Secondary | ICD-10-CM | POA: Diagnosis not present

## 2022-10-07 DIAGNOSIS — M955 Acquired deformity of pelvis: Secondary | ICD-10-CM | POA: Diagnosis not present

## 2022-10-07 DIAGNOSIS — M9905 Segmental and somatic dysfunction of pelvic region: Secondary | ICD-10-CM | POA: Diagnosis not present

## 2022-10-07 DIAGNOSIS — M5442 Lumbago with sciatica, left side: Secondary | ICD-10-CM | POA: Diagnosis not present

## 2022-10-09 NOTE — Telephone Encounter (Signed)
Hip is still hurting what does she need to do

## 2022-10-14 ENCOUNTER — Telehealth: Payer: Self-pay | Admitting: Student in an Organized Health Care Education/Training Program

## 2022-10-14 NOTE — Telephone Encounter (Signed)
Spoke with patient and she said she ws to let Dr Cherylann Ratel know how her pain was doing.  She states that her left hip is better, but her right hip is very painful still.  Just FYI

## 2022-10-14 NOTE — Telephone Encounter (Signed)
PT called states that she had call last week left a msg for a nurse to give her a call. PT states that no one never gave her a call.Please give patient a call. TY

## 2022-10-15 ENCOUNTER — Telehealth: Payer: Self-pay | Admitting: Student in an Organized Health Care Education/Training Program

## 2022-10-15 NOTE — Telephone Encounter (Signed)
error 

## 2022-10-20 ENCOUNTER — Encounter: Payer: Self-pay | Admitting: Student in an Organized Health Care Education/Training Program

## 2022-10-20 ENCOUNTER — Ambulatory Visit
Payer: Medicare Other | Attending: Student in an Organized Health Care Education/Training Program | Admitting: Student in an Organized Health Care Education/Training Program

## 2022-10-20 VITALS — BP 145/95 | HR 77 | Temp 97.3°F | Resp 16 | Ht 61.0 in | Wt 113.0 lb

## 2022-10-20 DIAGNOSIS — M47818 Spondylosis without myelopathy or radiculopathy, sacral and sacrococcygeal region: Secondary | ICD-10-CM | POA: Insufficient documentation

## 2022-10-20 DIAGNOSIS — G5711 Meralgia paresthetica, right lower limb: Secondary | ICD-10-CM

## 2022-10-20 DIAGNOSIS — M461 Sacroiliitis, not elsewhere classified: Secondary | ICD-10-CM

## 2022-10-20 DIAGNOSIS — M79651 Pain in right thigh: Secondary | ICD-10-CM

## 2022-10-20 NOTE — Progress Notes (Signed)
Safety precautions to be maintained throughout the outpatient stay will include: orient to surroundings, keep bed in low position, maintain call bell within reach at all times, provide assistance with transfer out of bed and ambulation.  

## 2022-10-20 NOTE — Patient Instructions (Signed)
Procedure instructions  Do not eat or drink fluids (other than water) for 6 hours before your procedure  No water for 2 hours before your procedure  Take your blood pressure medicine with a sip of water  Arrive 30 minutes before your appointment  Carefully read the "Preparing for your procedure" detailed instructions  If you have questions call us at [phone]  _____________________________________________________________________    ______________________________________________________________________  Preparing for your procedure  Appointments: If you think you may not be able to keep your appointment, call 24-48 hours in advance to cancel. We need time to make it available to others.  During your procedure appointment there will be: No Prescription Refills. No disability issues to discussed. No medication changes or discussions.  Instructions: Food intake: Avoid eating anything solid for at least 8 hours prior to your procedure. Clear liquid intake: You may take clear liquids such as water up to 2 hours prior to your procedure. (No carbonated drinks. No soda.) Transportation: Unless otherwise stated by your physician, bring a driver. Morning Medicines: Except for blood thinners, take all of your other morning medications with a sip of water. Make sure to take your heart and blood pressure medicines. If your blood pressure's lower number is above 100, the case will be rescheduled. Blood thinners: Make sure to stop your blood thinners as instructed.  If you take a blood thinner, but were not instructed to stop it, call our office [phone] and ask to talk to a nurse. Not stopping a blood thinner prior to certain procedures could lead to serious complications. Diabetics on insulin: Notify the staff so that you can be scheduled 1st case in the morning. If your diabetes requires high dose insulin, take only  of your normal insulin dose the morning of the procedure and  notify the staff that you have done so. Preventing infections: Shower with an antibacterial soap the morning of your procedure.  Build-up your immune system: Take 1000 mg of Vitamin C with every meal (3 times a day) the day prior to your procedure. Antibiotics: Inform the nursing staff if you are taking any antibiotics or if you have any conditions that may require antibiotics prior to procedures. (Example: recent joint implants)   Pregnancy: If you are pregnant make sure to notify the nursing staff. Not doing so may result in injury to the fetus, including death.  Sickness: If you have a cold, fever, or any active infections, call and cancel or reschedule your procedure. Receiving steroids while having an infection may result in complications. Arrival: You must be in the facility at least 30 minutes prior to your scheduled procedure. Tardiness: Your scheduled time is also the cutoff time. If you do not arrive at least 15 minutes prior to your procedure, you will be rescheduled.  Children: Do not bring any children with you. Make arrangements to keep them home. Dress appropriately: There is always a possibility that your clothing may get soiled. Avoid long dresses. Valuables: Do not bring any jewelry or valuables.  Reasons to call and reschedule or cancel your procedure: (Following these recommendations will minimize the risk of a serious complication.) Surgeries: Avoid having procedures within 2 weeks of any surgery. (Avoid for 2 weeks before or after any surgery). Flu Shots: Avoid having procedures within 2 weeks of a flu shots or . (Avoid for 2 weeks before or after immunizations). Barium: Avoid having a procedure within 7-10 days after having had a radiological study involving the use of radiological contrast. (  Myelograms, Barium swallow or enema study). Heart attacks: Avoid any elective procedures or surgeries for the initial 6 months after a "Myocardial Infarction" (Heart Attack). Blood  thinners: It is imperative that you stop these medications before procedures. Let us know if you if you take any blood thinner.  Infection: Avoid procedures during or within two weeks of an infection (including chest colds or gastrointestinal problems). Symptoms associated with infections include: Localized redness, fever, chills, night sweats or profuse sweating, burning sensation when voiding, cough, congestion, stuffiness, runny nose, sore throat, diarrhea, nausea, vomiting, cold or Flu symptoms, recent or current infections. It is specially important if the infection is over the area that we intend to treat. Heart and lung problems: Symptoms that may suggest an active cardiopulmonary problem include: cough, chest pain, breathing difficulties or shortness of breath, dizziness, ankle swelling, uncontrolled high or unusually low blood pressure, and/or palpitations. If you are experiencing any of these symptoms, cancel your procedure and contact your primary care physician for an evaluation.  Remember:  Regular Business hours are:  Monday to Thursday 8:00 AM to 4:00 PM  Provider's Schedule: Francisco Naveira, MD:  Procedure days: Tuesday and Thursday 7:30 AM to 4:00 PM  Bilal Lateef, MD:  Procedure days: Monday and Wednesday 7:30 AM to 4:00 PM 

## 2022-10-20 NOTE — Progress Notes (Signed)
PROVIDER NOTE: Information contained herein reflects review and annotations entered in association with encounter. Interpretation of such information and data should be left to medically-trained personnel. Information provided to patient can be located elsewhere in the medical record under "Patient Instructions". Document created using STT-dictation technology, any transcriptional errors that may result from process are unintentional.    Patient: Danielle Herrera  Service Category: E/M  Provider: Edward Jolly, MD  DOB: [birth year]  DOS: 10/20/2022  Referring Provider: Reubin Milan, MD  MRN: [id number]  Specialty: Interventional Pain Management  PCP: Danielle Milan, MD  Type: Established Patient  Setting: Ambulatory outpatient    Location: Office  Delivery: Face-to-face     HPI  [name], a 89 y.o. 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 Ms. [name] primary complain today is Hip Pain (right)  Pertinent problems: Ms. [name] has SI (sacroiliac) joint dysfunction; Bilateral primary osteoarthritis of hip; Piriformis syndrome of both sides; and Chronic pain syndrome on their pertinent problem list. Pain Assessment: Severity of Chronic pain is reported as a 2  (right)/10. Location: Hip Right/radiates down right leg. Onset: More than a month ago. Quality: Aching. Timing: Intermittent. Modifying factor(s): sitting. Vitals:  height is 5\' 1"  (1.549 m) and weight is 113 lb (51.3 kg). Her temperature is 97.3 F (36.3 C) (abnormal). Her blood pressure is 145/95 (abnormal) and her pulse is 77. Her respiration is 16 and oxygen saturation is 100%.  BMI: Estimated body mass index is 21.35 kg/m as calculated from the following:   Height as of this encounter: 5\' 1"  (1.549 m).   Weight as of this encounter: 113 lb (51.3 kg). Last encounter: 10/05/2022. Last procedure: 09/23/2022.  Reason for encounter:  Patient presents today  with worsening right lateral hip and right lateral thigh pain.  She describes burning and tingling over the pain.  She has had a right IT band release and trochanteric bursectomy in the past.  She likely has a component of lateral femoral cutaneous nerve entrapment and associated neuralgia.  We discussed a right ultrasound-guided lateral femoral cutaneous nerve block.  She is also inquiring about working with a [place] which I have no concerns with. .     ROS  Constitutional: Denies any fever or chills Gastrointestinal: No reported hemesis, hematochezia, vomiting, or acute GI distress Musculoskeletal:  Right lateral thigh pain Neurological: No reported episodes of acute onset apraxia, aphasia, dysarthria, agnosia, amnesia, paralysis, loss of coordination, or loss of consciousness  Medication Review  Multiple Vitamins-Minerals, amLODipine, cholecalciferol, furosemide, lisinopril, methimazole, multivitamin with minerals, naproxen sodium, sennosides-docusate sodium, and traMADol  History Review  Allergy: Ms. [name] is allergic to tylenol [acetaminophen]. Drug: Ms. [name]  reports no history of drug use. Alcohol:  reports current alcohol use of about 3.0 standard drinks of alcohol per week. Tobacco:  reports that she has never smoked. She has never used smokeless tobacco. Social: Ms. [name]  reports that she has never smoked. She has never used smokeless tobacco. She reports current alcohol use of about 3.0 standard drinks of alcohol per week. She reports that she does not use drugs. 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 Surgical: Ms. [name]  has a past surgical history that includes Appendectomy; Acoustic neuroma resection (Right, 1985); Colon surgery; Shoulder arthroscopy w/ acromial repair (Right, 55yrs ago); Tubal ligation; Parathyroidectomy (2017); Ventriculoperitoneal shunt (Right, 09/08/2012); Brain surgery (09/08/2012); Abdominal hysterectomy; Eye surgery (Right); Fracture surgery (Right); Excision/release bursa hip (Right, 10/29/2017); Cataract extraction (Bilateral); Breast excisional biopsy (Left); Breast excisional biopsy (Left); and Breast surgery (Left). Family: family history includes Breast cancer in her paternal aunt; Dementia in her mother; Diabetes in her sister; Heart disease in her brother and father; Hypertension in her father and mother; Mental illness in her mother; Stroke in her brother.  Laboratory Chemistry Profile   Renal Lab Results  Component Value Date   BUN 16 02/02/2022   CREATININE 0.97 02/02/2022   BCR 16 02/02/2022   GFRAA 60 01/23/2020   GFRNONAA 52 (L) 01/23/2020    Hepatic Lab Results  Component Value Date   AST 26 02/02/2022   ALT 17 02/02/2022   ALBUMIN 4.4 02/02/2022   ALKPHOS 91 02/02/2022   LIPASE 39 05/28/2017    Electrolytes Lab Results  Component Value Date   NA 144 02/02/2022   K 4.3 02/02/2022   CL 105 02/02/2022   CALCIUM 10.1 02/02/2022    Bone Lab Results  Component Value Date   VD25OH 45.1 02/02/2022    Inflammation (CRP: Acute Phase) (ESR: Chronic Phase) No results found for: "CRP", "ESRSEDRATE", "LATICACIDVEN"       Note: Above Lab results reviewed.  Physical Exam  General appearance: Well nourished,  well developed, and well hydrated. In no apparent acute distress Mental status: Alert, oriented x 3 (person, place, & time)       Respiratory: No evidence of acute respiratory distress Eyes: PERLA Vitals: BP (!) 145/95   Pulse 77   Temp (!) 97.3 F (36.3 C)   Resp 16   Ht 5\' 1"  (1.549 m)   Wt 113 lb (51.3 kg)   SpO2 100%   BMI 21.35 kg/m  BMI: Estimated body mass index is 21.35 kg/m as calculated from the following:   Height as of this encounter: 5\' 1"  (1.549 m).   Weight as of this encounter: 113 lb (51.3 kg). Ideal: Ideal body weight: 47.8 kg (105 lb 6.1 oz) Adjusted ideal body weight: 49.2 kg (108 lb 6.8 oz)  Right lateral thigh pain, neurogenic  Assessment   Diagnosis Status  1. Lateral femoral cutaneous entrapment syndrome, right   2. Pain of right lateral upper thigh   3. SI joint arthritis    Persistent Persistent Controlled     Plan of Care    Orders:  Orders Placed This Encounter  Procedures   FEMORAL NERVE BLOCK    Standing Status:   Future    Standing Expiration Date:   01/20/2023    Scheduling Instructions:     Right LFCN block    Order Specific Question:   Where will this procedure be performed?    Answer:   [facility]   Follow-up plan:   Return for Right LFCN block ([place]), in clinic [facility].      Bilateral SI joint and piriformis 09/23/2022      Recent Visits Date Type Provider Dept  10/05/22 Office Visit Edward Jolly, MD [facility]  09/23/22 Procedure visit Edward Jolly, MD [facility]  09/15/22 Office Visit Edward Jolly,  MD [facility]  Showing recent visits within past 90 days and meeting all other requirements Today's Visits Date Type Provider Dept  10/20/22 Office Visit Edward Jolly, MD [facility]  Showing today's visits and meeting all other requirements Future Appointments Date Type Provider Dept  12/31/22 Appointment Edward Jolly, MD [facility]  Showing future  appointments within next 90 days and meeting all other requirements  I discussed the assessment and treatment plan with the patient. The patient was provided an opportunity to ask questions and all were answered. The patient agreed with the plan and demonstrated an understanding of the instructions.  Patient advised to call back or seek an in-person evaluation if the symptoms or condition worsens.  Duration of encounter: .  Total time on encounter, as per AMA guidelines included both the face-to-face and non-face-to-face time personally spent by the physician and/or other qualified health care professional(s) on the day of the encounter (includes time in activities that require the physician or other qualified health care professional and does not include time in activities normally performed by clinical staff). Physician's time may include the following activities when performed: Preparing to see the patient (e.g., pre-charting review of records, searching for previously ordered imaging, lab work, and nerve conduction tests) Review of prior analgesic pharmacotherapies. Reviewing PMP Interpreting ordered tests (e.g., lab work, imaging, nerve conduction tests) Performing post-procedure evaluations, including interpretation of diagnostic procedures Obtaining and/or reviewing separately obtained history Performing a medically appropriate examination and/or evaluation Counseling and educating the patient/family/caregiver Ordering medications, tests, or procedures Referring and communicating with other health care professionals (when not separately reported) Documenting clinical information in the electronic or other health record Independently interpreting results (not separately reported) and communicating results to the patient/ family/caregiver Care coordination (not separately reported)  Note by: Edward Jolly, MD Date: 10/20/2022; Time: 11:36 AM

## 2022-10-26 ENCOUNTER — Ambulatory Visit
Payer: Medicare Other | Attending: Student in an Organized Health Care Education/Training Program | Admitting: Student in an Organized Health Care Education/Training Program

## 2022-10-26 ENCOUNTER — Telehealth: Payer: Self-pay | Admitting: Student in an Organized Health Care Education/Training Program

## 2022-10-26 ENCOUNTER — Encounter: Payer: Self-pay | Admitting: Student in an Organized Health Care Education/Training Program

## 2022-10-26 DIAGNOSIS — G5711 Meralgia paresthetica, right lower limb: Secondary | ICD-10-CM

## 2022-10-26 DIAGNOSIS — M79651 Pain in right thigh: Secondary | ICD-10-CM | POA: Diagnosis not present

## 2022-10-26 NOTE — Telephone Encounter (Signed)
Patient would like to talk to Dr Cherylann Ratel about the upcoming procedure.  Scheduled a vv for this afternoon.

## 2022-10-26 NOTE — Telephone Encounter (Signed)
Attempted to call patient, message left. 

## 2022-10-26 NOTE — Telephone Encounter (Signed)
PT will be having procedure on 11/04/22 states that she had look up some information about the procedure and have some questions that she will like to ask the nurse. Please give patient a call. TY

## 2022-10-26 NOTE — Progress Notes (Signed)
Patient: Danielle Herrera  Service Category: E/M  Provider: Edward Jolly, MD  DOB: December 20, 1933  DOS: 10/26/2022  Location: Office  MRN: [id number]  Setting: Ambulatory outpatient  Referring Provider: Reubin Milan, MD  Type: Established Patient  Specialty: Interventional Pain Management  PCP: Reubin Milan, MD  Location: Remote location  Delivery: TeleHealth     Virtual Encounter - Pain Management PROVIDER NOTE: Information contained herein reflects review and annotations entered in association with encounter. Interpretation of such information and data should be left to medically-trained personnel. Information provided to patient can be located elsewhere in the medical record under "Patient Instructions". Document created using STT-dictation technology, any transcriptional errors that may result from process are unintentional.    Contact & Pharmacy Preferred: [phone] Home: [phone] (home) Mobile: [phone] (mobile) E-mail: [email or web address]  [facility] [phone] Dan Humphre[place], [place] - [address] [place] [address] Phone: [phone] Fax: [phone]   Pre-screening  Ms. Algeo offered "in-person" vs "virtual" encounter. She indicated preferring virtual for this encounter.   Reason COVID-19*  Social distancing based on CDC and AMA recommendations.   I contacted Nekayla White Lamantia on 10/26/2022 via telephone.      I clearly identified myself as Edward Jolly, MD. I verified that I was speaking with the correct person using two identifiers (Name: Toini Seta, and date of birth: [birth year]).  Consent I sought verbal advanced consent from [facility] for virtual visit interactions. I informed Ms. Partridge of possible security and privacy concerns, risks, and limitations associated with providing "not-in-person" medical evaluation and management services. I also informed Ms. Moretz of the availability of "in-person" appointments. Finally, I informed  her that there would be a charge for the virtual visit and that she could be  personally, fully or partially, financially responsible for it. Ms. Michaels expressed understanding and agreed to proceed.   Historic Elements   [name] is a 89 y.o. year old, female patient evaluated today after our last contact on 10/26/2022. Ms. 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 She also  has a past surgical history that includes Appendectomy; Acoustic neuroma resection (Right, 1985); Colon surgery; Shoulder arthroscopy w/ acromial repair (Right, 7yrs ago); Tubal ligation; Parathyroidectomy (2017); Ventriculoperitoneal shunt (Right, 09/08/2012); Brain surgery (09/08/2012); Abdominal hysterectomy; Eye surgery (Right); Fracture surgery (Right); Excision/release bursa hip (Right, 10/29/2017); Cataract extraction (Bilateral); Breast excisional biopsy (Left); Breast excisional biopsy (Left); and Breast surgery (Left). Ms. Trusso has a current medication list which includes the following prescription(s): amlodipine, cholecalciferol, furosemide, lisinopril, methimazole, multivitamin with minerals, multiple vitamins-minerals, naproxen sodium, sennosides-docusate sodium, and tramadol. She  reports that she has never  smoked. She has never used smokeless tobacco. She reports current alcohol use of about 3.0 standard drinks of alcohol per week. She reports that she does not use drugs. Ms. Lehmann is allergic to tylenol [acetaminophen].  BMI: Estimated body mass index is 21.35 kg/m as calculated from the following:   Height as of 10/20/22: 5\' 1"  (1.549 m).   Weight as of 10/20/22: 113  lb (51.3 kg). Last encounter: 10/20/2022. Last procedure: 09/23/2022.  HPI  Today, she is being contacted for  patient requested evaluation  Patient has questions about LFCN block   Laboratory Chemistry Profile   Renal Lab Results  Component Value Date   BUN 16 02/02/2022   CREATININE 0.97 02/02/2022   BCR 16 02/02/2022   GFRAA 60 01/23/2020   GFRNONAA 52 (L) 01/23/2020    Hepatic Lab Results  Component Value Date   AST 26 02/02/2022   ALT 17 02/02/2022   ALBUMIN 4.4 02/02/2022   ALKPHOS 91 02/02/2022   LIPASE 39 05/28/2017    Electrolytes Lab Results  Component Value Date   NA 144 02/02/2022   K 4.3 02/02/2022   CL 105 02/02/2022   CALCIUM 10.1 02/02/2022    Bone Lab Results  Component Value Date   VD25OH 45.1 02/02/2022    Inflammation (CRP: Acute Phase) (ESR: Chronic Phase) No results found for: "CRP", "ESRSEDRATE", "LATICACIDVEN"       Note: Above Lab results reviewed.  Assessment  The primary encounter diagnosis was Lateral femoral cutaneous entrapment syndrome, right. A diagnosis of Pain of right lateral upper thigh was also pertinent to this visit.  Plan of Care  Patient states that she was reading about this nerve block, lateral femoral cutaneous on [employer] and read about some side effects that concern her.  We had a discussion about the side effects.  Lateral femoral cutaneous nerve block under ultrasound guidance is very low risk.  She states that her leg is feeling somewhat better than it was last week when she saw me.  She states that she will hold off on the nerve block at this time.  We  can schedule it whenever the patient prefers, as needed order in place.  Otherwise continue with medication management as prescribed.  No change in dose.  Follow-up plan:   Return for Keep sch. appt in Oct.      Bilateral SI joint and piriformis 09/23/2022    Recent Visits Date Type Provider Dept  10/20/22 Office Visit Edward Jolly, MD [facility]  10/05/22 Office Visit Edward Jolly, MD [facility]  09/23/22 Procedure visit Edward Jolly, MD [facility]  09/15/22 Office Visit Edward Jolly, MD [facility]  Showing recent visits within past 90 days and meeting all other requirements Today's Visits Date Type Provider Dept  10/26/22 Office Visit Edward Jolly, MD [facility]  Showing today's visits and meeting all other requirements Future Appointments Date Type Provider Dept  11/04/22 Appointment Edward Jolly, MD [facility]  12/31/22 Appointment Edward Jolly, MD [facility]  Showing future appointments within next 90 days and meeting all other requirements  I discussed the assessment and treatment plan with the patient. The patient was provided an opportunity to ask questions and all were answered. The patient agreed with the plan and demonstrated an understanding of the instructions.  Patient advised to call back or seek an in-person evaluation if the symptoms or condition worsens.  Duration of encounter: .  Note by: Edward Jolly, MD Date: 10/26/2022; Time: 1:25 PM

## 2022-10-28 DIAGNOSIS — E041 Nontoxic single thyroid nodule: Secondary | ICD-10-CM | POA: Diagnosis not present

## 2022-10-28 DIAGNOSIS — H90A21 Sensorineural hearing loss, unilateral, right ear, with restricted hearing on the contralateral side: Secondary | ICD-10-CM | POA: Diagnosis not present

## 2022-10-28 DIAGNOSIS — H6123 Impacted cerumen, bilateral: Secondary | ICD-10-CM | POA: Diagnosis not present

## 2022-11-04 ENCOUNTER — Ambulatory Visit: Payer: Medicare Other | Admitting: Student in an Organized Health Care Education/Training Program

## 2022-11-16 ENCOUNTER — Ambulatory Visit: Payer: Medicare Other | Admitting: Student in an Organized Health Care Education/Training Program

## 2022-11-18 ENCOUNTER — Ambulatory Visit
Payer: Medicare Other | Attending: Student in an Organized Health Care Education/Training Program | Admitting: Student in an Organized Health Care Education/Training Program

## 2022-11-18 ENCOUNTER — Encounter: Payer: Self-pay | Admitting: Student in an Organized Health Care Education/Training Program

## 2022-11-18 VITALS — BP 147/68 | HR 80 | Temp 97.4°F | Resp 16 | Ht 61.0 in | Wt 113.0 lb

## 2022-11-18 DIAGNOSIS — M25551 Pain in right hip: Secondary | ICD-10-CM | POA: Insufficient documentation

## 2022-11-18 DIAGNOSIS — M79651 Pain in right thigh: Secondary | ICD-10-CM

## 2022-11-18 DIAGNOSIS — G5711 Meralgia paresthetica, right lower limb: Secondary | ICD-10-CM | POA: Diagnosis not present

## 2022-11-18 DIAGNOSIS — Z886 Allergy status to analgesic agent status: Secondary | ICD-10-CM | POA: Insufficient documentation

## 2022-11-18 MED ORDER — DEXAMETHASONE SODIUM PHOSPHATE 10 MG/ML IJ SOLN
10.0000 mg | Freq: Once | INTRAMUSCULAR | Status: AC
  Administered 2022-11-18: 10 mg
  Filled 2022-11-18: qty 1

## 2022-11-18 MED ORDER — LIDOCAINE HCL 2 % IJ SOLN
20.0000 mL | Freq: Once | INTRAMUSCULAR | Status: AC
  Administered 2022-11-18: 200 mg
  Filled 2022-11-18: qty 40

## 2022-11-18 MED ORDER — ROPIVACAINE HCL 2 MG/ML IJ SOLN
4.0000 mL | Freq: Once | INTRAMUSCULAR | Status: AC
  Administered 2022-11-18: 4 mL via INTRA_ARTICULAR
  Filled 2022-11-18: qty 20

## 2022-11-18 NOTE — Progress Notes (Signed)
Safety precautions to be maintained throughout the outpatient stay will include: orient to surroundings, keep bed in low position, maintain call bell within reach at all times, provide assistance with transfer out of bed and ambulation.  

## 2022-11-18 NOTE — Patient Instructions (Signed)

## 2022-11-18 NOTE — Progress Notes (Signed)
PROVIDER NOTE: Interpretation of information contained herein should be left to medically-trained personnel. Specific patient instructions are provided elsewhere under "Patient Instructions" section of medical record. This document was created in part using STT-dictation technology, any transcriptional errors that may result from this process are unintentional.  Patient: Danielle Herrera Don Type: Established DOB: Sep 28, 1933 MRN: [id number] PCP: Reubin Milan, MD  Service: Procedure DOS: 11/18/2022 Setting: Ambulatory Location: Ambulatory outpatient facility Delivery: Face-to-face Provider: Edward Jolly, MD Specialty: Interventional Pain Management Specialty designation: 09 Location: Outpatient facility Ref. Prov.: Reubin Milan, MD       Interventional Therapy   Primary Reason for Visit: Interventional Pain Management Treatment. CC: Hip Pain (right)    Procedure:          Anesthesia, Analgesia, Anxiolysis:  Type:  Lateral femoral cutaneous  Nerve Block          Primary Purpose: Diagnostic Region: Inferior to ASIS Target Area: Between the tensor fascia latte and sartorius muscle at the medial and inferior border Approach: Anterior approach Laterality: Right  Anesthesia: Local (1-2% Lidocaine)  Anxiolysis: None  Sedation: None  Guidance: Ultrasound           Position: Supine   1. Lateral femoral cutaneous entrapment syndrome, right   2. Pain of right lateral upper thigh    NAS-11 Pain score:   Pre-procedure: 2 /10   Post-procedure: 0-No pain/10     H&P (Pre-op Assessment):  Ms. Sayarath is a 89 y.o. (year old), female patient, seen today for interventional treatment. She  has a past surgical history that includes Appendectomy; Acoustic neuroma resection (Right, 1985); Colon surgery; Shoulder arthroscopy w/ acromial repair (Right, 19yrs ago); Tubal ligation; Parathyroidectomy (2017); Ventriculoperitoneal shunt (Right, 09/08/2012); Brain surgery (09/08/2012); Abdominal  hysterectomy; Eye surgery (Right); Fracture surgery (Right); Excision/release bursa hip (Right, 10/29/2017); Cataract extraction (Bilateral); Breast excisional biopsy (Left); Breast excisional biopsy (Left); and Breast surgery (Left). Ms. Backhus has a current medication list which includes the following prescription(s): amlodipine, cholecalciferol, furosemide, lisinopril, methimazole, multivitamin with minerals, multiple vitamins-minerals, naproxen sodium, sennosides-docusate sodium, and tramadol. Her primarily concern today is the Hip Pain (right)  Initial Vital Signs:  Pulse/HCG Rate: 79  Temp: (!) 97.4 F (36.3 C) Resp: 16 BP: (!) 141/86 SpO2: 100 %  BMI: Estimated body mass index is 21.35 kg/m as calculated from the following:   Height as of this encounter: 5\' 1"  (1.549 m).   Weight as of this encounter: 113 lb (51.3 kg).  Risk Assessment: Allergies: Reviewed. She is allergic to tylenol [acetaminophen].  Allergy Precautions: None required Coagulopathies: Reviewed. None identified.  Blood-thinner therapy: None at this time Active Infection(s): Reviewed. None identified. Ms. Bravata is afebrile  Site Confirmation: Ms. Cameron was asked to confirm the procedure and laterality before marking the site Procedure checklist: Completed Consent: Before the procedure and under the influence of no sedative(s), amnesic(s), or anxiolytics, the patient was informed of the treatment options, risks and possible complications. To fulfill our ethical and legal obligations, as recommended by the American Medical Association's Code of Ethics, I have informed the patient of my clinical impression; the nature and purpose of the treatment or procedure; the risks, benefits, and possible complications of the intervention; the alternatives, including doing nothing; the risk(s) and benefit(s) of the alternative treatment(s) or procedure(s); and the risk(s) and benefit(s) of doing nothing. The patient was provided  information about the general risks and possible complications associated with the procedure. These may include, but are not limited to: failure to achieve  desired goals, infection, bleeding, organ or nerve damage, allergic reactions, paralysis, and death. In addition, the patient was informed of those risks and complications associated to the procedure, such as failure to decrease pain; infection; bleeding; organ or nerve damage with subsequent damage to sensory, motor, and/or autonomic systems, resulting in permanent pain, numbness, and/or weakness of one or several areas of the body; allergic reactions; (i.e.: anaphylactic reaction); and/or death. Furthermore, the patient was informed of those risks and complications associated with the medications. These include, but are not limited to: allergic reactions (i.e.: anaphylactic or anaphylactoid reaction(s)); adrenal axis suppression; blood sugar elevation that in diabetics may result in ketoacidosis or comma; water retention that in patients with history of congestive heart failure may result in shortness of breath, pulmonary edema, and decompensation with resultant heart failure; weight gain; swelling or edema; medication-induced neural toxicity; particulate matter embolism and blood vessel occlusion with resultant organ, and/or nervous system infarction; and/or aseptic necrosis of one or more joints. Finally, the patient was informed that Medicine is not an exact science; therefore, there is also the possibility of unforeseen or unpredictable risks and/or possible complications that may result in a catastrophic outcome. The patient indicated having understood very clearly. We have given the patient no guarantees and we have made no promises. Enough time was given to the patient to ask questions, all of which were answered to the patient's satisfaction. Ms. Manikowski has indicated that she wanted to continue with the procedure. Attestation: I, the ordering  provider, attest that I have discussed with the patient the benefits, risks, side-effects, alternatives, likelihood of achieving goals, and potential problems during recovery for the procedure that I have provided informed consent. Date  Time: 11/18/2022 11:03 AM  Pre-Procedure Preparation:  Monitoring: As per clinic protocol. Respiration, ETCO2, SpO2, BP, heart rate and rhythm monitor placed and checked for adequate function Safety Precautions: Patient was assessed for positional comfort and pressure points before starting the procedure. Time-out: I initiated and conducted the "Time-out" before starting the procedure, as per protocol. The patient was asked to participate by confirming the accuracy of the "Time Out" information. Verification of the correct person, site, and procedure were performed and confirmed by me, the nursing staff, and the patient. "Time-out" conducted as per Joint Commission's Universal Protocol (UP.01.01.01). Time: 1152 Start Time: 1152 hrs.  Description of Procedure:          Area Prepped: Entire Anterolateral hip area. DuraPrep (Iodine Povacrylex [0.7% available iodine] and Isopropyl Alcohol, 74% w/w) Safety Precautions: Aspiration looking for blood return was conducted prior to all injections. At no point did we inject any substances, as a needle was being advanced. No attempts were made at seeking any paresthesias. Safe injection practices and needle disposal techniques used. Medications properly checked for expiration dates. SDV (single dose vial) medications used.  Description of the Procedure (block of the [facility]) : Protocol guidelines were followed. The patient was placed in supine position over the procedure table. The entire groin area and upper thigh was prepped and draped in the usual manner.  The lateral femoral cutaneous nerve was identified and between the tensor fascia lata and the sartorius and was located 1.5 cm medial and inferior to the anterior superior  iliac spine parallel to the ileal inguinal ligament.  Under ultrasound guidance using in-plane technique, 5 cc solution made of 4 cc of 0.2% lidocaine, 1 cc of Decadron 10 mg/cc was injected for the right lateral femoral cutaneous nerve.   Vitals:   11/18/22  1116 11/18/22 1204  BP: (!) 141/86 (!) 147/68  Pulse: 79 80  Resp: 16 16  Temp: (!) 97.4 F (36.3 C)   SpO2: 100% 100%  Weight: 113 lb (51.3 kg)   Height: 5\' 1"  (1.549 m)     Start Time: 1152 hrs. End Time: 1204 hrs.      Materials:  Needle(s) Type: Spinal Needle Gauge: 22G Length: 3.5-in     Antibiotic Prophylaxis:   Anti-infectives (From admission, onward)    None      Indication(s): None identified  Post-operative Assessment:  Post-procedure Vital Signs:  Pulse/HCG Rate: 80  Temp: (!) 97.4 F (36.3 C) Resp: 16 BP: (!) 147/68 SpO2: 100 %  EBL: None  Complications: No immediate post-treatment complications observed by team, or reported by patient.  Note: The patient tolerated the entire procedure well. A repeat set of vitals were taken after the procedure and the patient was kept under observation following institutional policy, for this type of procedure. Post-procedural neurological assessment was performed, showing return to baseline, prior to discharge. The patient was provided with post-procedure discharge instructions, including a section on how to identify potential problems. Should any problems arise concerning this procedure, the patient was given instructions to immediately contact us, at any time, without hesitation. In any case, we plan to contact the patient by telephone for a follow-up status report regarding this interventional procedure.  Comments:  No additional relevant information.  Plan of Care (POC)  Orders:  No orders of the defined types were placed in this encounter.    Medications ordered for procedure: Meds ordered this encounter  Medications   lidocaine (XYLOCAINE) 2 %  (with pres) injection 400 mg   dexamethasone (DECADRON) injection 10 mg   ropivacaine (PF) 2 mg/mL (0.2%) (NAROPIN) injection 4 mL   Medications administered: We administered lidocaine, dexamethasone, and ropivacaine (PF) 2 mg/mL (0.2%).  See the medical record for exact dosing, route, and time of administration.  Follow-up plan:   Return in about 2 weeks (around 12/02/2022), or F2F PPE.       Bilateral SI joint and piriformis 09/23/2022, R LFCN 9/18/     Recent Visits Date Type Provider Dept  10/26/22 Office Visit Edward Jolly, MD [facility]  10/20/22 Office Visit Edward Jolly, MD [facility]  10/05/22 Office Visit Edward Jolly, MD [facility]  09/23/22 Procedure visit Edward Jolly, MD [facility]  09/15/22 Office Visit Edward Jolly, MD [facility]  Showing recent visits within past 90 days and meeting all other requirements Today's Visits Date Type Provider Dept  11/18/22 Procedure visit Edward Jolly, MD [facility]  Showing today's visits and meeting all other requirements Future Appointments Date Type Provider Dept  12/03/22 Appointment Edward Jolly, MD [facility]  Showing future appointments within next 90 days and meeting all other requirements  Disposition: Discharge home  Discharge (Date  Time): 11/18/2022; 1207 hrs.   Primary Care Physician: Reubin Milan, MD Location: [facility] Note by: Edward Jolly, MD (TTS technology used. I apologize for any typographical errors that were not detected and corrected.) Date: 11/18/2022; Time: 2:34 PM  Disclaimer:  Medicine is not an Visual merchandiser. The only guarantee in medicine is that nothing is guaranteed. It is important to note that the decision to proceed with this intervention was based on the information collected from the patient. The Data and conclusions were drawn from the patient's questionnaire, the interview,  and the physical  examination. Because the information was provided in large part by the patient, it cannot be guaranteed that it has not been purposely or unconsciously manipulated. Every effort has been made to obtain as much relevant data as possible for this evaluation. It is important to note that the conclusions that lead to this procedure are derived in large part from the available data. Always take into account that the treatment will also be dependent on availability of resources and existing treatment guidelines, considered by other Pain Management Practitioners as being common knowledge and practice, at the time of the intervention. For Medico-Legal purposes, it is also important to point out that variation in procedural techniques and pharmacological choices are the acceptable norm. The indications, contraindications, technique, and results of the above procedure should only be interpreted and judged by a Board-Certified Interventional Pain Specialist with extensive familiarity and expertise in the same exact procedure and technique.

## 2022-11-19 ENCOUNTER — Telehealth: Payer: Self-pay | Admitting: *Deleted

## 2022-11-19 NOTE — Telephone Encounter (Signed)
No problems post procedure.

## 2022-11-23 DIAGNOSIS — D0439 Carcinoma in situ of skin of other parts of face: Secondary | ICD-10-CM | POA: Diagnosis not present

## 2022-11-23 DIAGNOSIS — C44519 Basal cell carcinoma of skin of other part of trunk: Secondary | ICD-10-CM | POA: Diagnosis not present

## 2022-11-23 DIAGNOSIS — Z86018 Personal history of other benign neoplasm: Secondary | ICD-10-CM | POA: Diagnosis not present

## 2022-11-23 DIAGNOSIS — Z872 Personal history of diseases of the skin and subcutaneous tissue: Secondary | ICD-10-CM | POA: Diagnosis not present

## 2022-11-23 DIAGNOSIS — D485 Neoplasm of uncertain behavior of skin: Secondary | ICD-10-CM | POA: Diagnosis not present

## 2022-11-23 DIAGNOSIS — Z85828 Personal history of other malignant neoplasm of skin: Secondary | ICD-10-CM | POA: Diagnosis not present

## 2022-11-23 DIAGNOSIS — L570 Actinic keratosis: Secondary | ICD-10-CM | POA: Diagnosis not present

## 2022-11-23 DIAGNOSIS — L578 Other skin changes due to chronic exposure to nonionizing radiation: Secondary | ICD-10-CM | POA: Diagnosis not present

## 2022-11-27 ENCOUNTER — Encounter: Payer: Self-pay | Admitting: Internal Medicine

## 2022-11-27 ENCOUNTER — Ambulatory Visit (INDEPENDENT_AMBULATORY_CARE_PROVIDER_SITE_OTHER): Payer: Medicare Other | Admitting: Internal Medicine

## 2022-11-27 VITALS — BP 132/74 | HR 78 | Ht 61.0 in | Wt 118.4 lb

## 2022-11-27 DIAGNOSIS — I10 Essential (primary) hypertension: Secondary | ICD-10-CM | POA: Diagnosis not present

## 2022-11-27 DIAGNOSIS — R233 Spontaneous ecchymoses: Secondary | ICD-10-CM

## 2022-11-27 DIAGNOSIS — G5711 Meralgia paresthetica, right lower limb: Secondary | ICD-10-CM | POA: Diagnosis not present

## 2022-11-27 NOTE — Assessment & Plan Note (Signed)
Normal exam with stable BP on lisinopril, amlodipine and lasix. No concerns or side effects to current medication. No change in regimen; continue low sodium diet.

## 2022-11-27 NOTE — Assessment & Plan Note (Signed)
Improved after treatment by Pain management

## 2022-11-27 NOTE — Progress Notes (Signed)
Date:  11/27/2022   Name:  Shawni Volkov   DOB:  October 20, 1933   MRN:  [id number]   Chief Complaint: Bleeding/Bruising (Left Lower leg large bruise. Patient just noticed it today but it is not painful. No injury that patient knows of. Says she bruises easily.)  Hypertension This is a chronic problem. The problem is controlled. Pertinent negatives include no chest pain, headaches, palpitations or shortness of breath.  Hip Pain  The pain is present in the right hip. The pain is mild. The pain has been Improving (since nerve block) since onset.    Lab Results  Component Value Date   NA 144 02/02/2022   K 4.3 02/02/2022   CO2 22 02/02/2022   GLUCOSE 93 02/02/2022   BUN 16 02/02/2022   CREATININE 0.97 02/02/2022   CALCIUM 10.1 02/02/2022   EGFR 57 (L) 02/02/2022   GFRNONAA 52 (L) 01/23/2020   Lab Results  Component Value Date   CHOL 270 (H) 02/02/2022   HDL 80 02/02/2022   LDLCALC 173 (H) 02/02/2022   TRIG 101 02/02/2022   CHOLHDL 3.4 02/02/2022   Lab Results  Component Value Date   TSH 0.64 04/03/2020   No results found for: "HGBA1C" Lab Results  Component Value Date   WBC 6.7 02/02/2022   HGB 14.0 02/02/2022   HCT 41.9 02/02/2022   MCV 88 02/02/2022   PLT 318 02/02/2022   Lab Results  Component Value Date   ALT 17 02/02/2022   AST 26 02/02/2022   ALKPHOS 91 02/02/2022   BILITOT 0.3 02/02/2022   Lab Results  Component Value Date   VD25OH 45.1 02/02/2022     Review of Systems  Constitutional:  Negative for fatigue and unexpected weight change.  HENT:  Negative for nosebleeds.   Respiratory:  Negative for cough, chest tightness, shortness of breath and wheezing.   Cardiovascular:  Positive for leg swelling. Negative for chest pain and palpitations.  Genitourinary:  Negative for hematuria.  Musculoskeletal:  Positive for arthralgias.  Neurological:  Negative for dizziness, weakness, light-headedness and headaches.  Hematological:  Bruises/bleeds  easily.  Psychiatric/Behavioral:  Negative for dysphoric mood and sleep disturbance. The patient is not nervous/anxious.     Patient Active Problem List   Diagnosis Date Noted   Lateral femoral cutaneous entrapment syndrome, right 11/27/2022   Senile ecchymosis 11/27/2022   SI (sacroiliac) joint dysfunction 09/15/2022   Bilateral primary osteoarthritis of hip 09/15/2022   Piriformis syndrome of both sides 09/15/2022   Chronic pain syndrome 09/15/2022   CKD stage 3a, GFR 45-59 ml/min (HCC) 08/04/2022   Enthesopathy of hip region on both sides 08/04/2022   Dysphagia 06/09/2022   Slow transit constipation 01/23/2020   Encounter for long-term (current) use of NSAIDs 05/26/2019   Benign essential tremor 03/10/2018   Insomnia 07/12/2017   Trochanteric bursitis of right hip 01/12/2017   Pseudophakia of right eye 12/12/2015   History of parathyroidectomy 07/10/2015   Hyperthyroidism, subclinical 04/22/2015   Hyperlipidemia, mixed 08/13/2014   Vitamin D deficiency 08/13/2014   Ventricular shunt in place 08/13/2014   Esophageal reflux 08/13/2014   Normal pressure hydrocephalus (HCC) 11/05/2012   Abnormal gait 08/26/2012   Goiter, nontoxic, multinodular 05/23/2012   History of acoustic neuroma 01/20/2010   Colon polyp 01/20/2010   Dependent edema 01/20/2010   Essential (primary) hypertension 01/20/2010   Arthritis of knee, left 01/20/2010   Osteopenia 01/20/2010    Allergies  Allergen Reactions   Tylenol [Acetaminophen] Other (See Comments)  Elevated LFTs    Past Surgical History:  Procedure Laterality Date   ABDOMINAL HYSTERECTOMY     ACOUSTIC NEUROMA RESECTION Right 1985   APPENDECTOMY     BRAIN SURGERY  09/08/2012   burr hole with biopsy; cranial tongs caliper/stereotactic frame right (Dr. Ladoris Gene)   BREAST EXCISIONAL BIOPSY Left    benign   BREAST EXCISIONAL BIOPSY Left    benign   BREAST SURGERY Left    lumpectomy   CATARACT EXTRACTION Bilateral     right eye several years ago; left eye fall 2021   COLON SURGERY     EXCISION/RELEASE BURSA HIP Right 10/29/2017   Procedure: EXCISION/RELEASE BURSA HIP- IT BAND;  Surgeon: Signa Kell, MD;  Location: [facility];  Service: Orthopedics;  Laterality: Right;   EYE SURGERY Right    cataract   FRACTURE SURGERY Right    shoulder   PARATHYROIDECTOMY  2017   partial for hyperparathyroidism   SHOULDER ARTHROSCOPY W/ ACROMIAL REPAIR Right 82yrs ago   TUBAL LIGATION     VENTRICULOPERITONEAL SHUNT Right 09/08/2012   for NPH    Social History   Tobacco Use   Smoking status: Never   Smokeless tobacco: Never  Vaping Use   Vaping status: Never Used  Substance Use Topics   Alcohol use: Yes    Alcohol/week: 3.0 standard drinks of alcohol    Types: 3 Glasses of wine per week    Comment: Occasionally   Drug use: Never     Medication list has been reviewed and updated.  Current Meds  Medication Sig   amLODipine (NORVASC) 10 MG tablet TAKE 1 TABLET BY MOUTH EVERY DAY   cholecalciferol (VITAMIN D3) 25 MCG (1000 UNIT) tablet Take 1,000 Units by mouth daily.   furosemide (LASIX) 20 MG tablet TAKE 1 TABLET BY MOUTH EVERY DAY   lisinopril (ZESTRIL) 10 MG tablet TAKE 1 TABLET BY MOUTH EVERY DAY   methimazole (TAPAZOLE) 5 MG tablet Take 5 mg by mouth daily. Dr. Alben Spittle- [facility].   Multiple Vitamin (MULTIVITAMIN WITH MINERALS) TABS tablet Take 1 tablet by mouth daily. Centrum Silver for Women 50+   Multiple Vitamins-Minerals (PRESERVISION AREDS 2 PO) Take 1 each by mouth 2 (two) times daily.   naproxen sodium (ALEVE) 220 MG tablet Take 4 tablets by mouth daily.   sennosides-docusate sodium (SENOKOT-S) 8.6-50 MG tablet Take 2 tablets by mouth 2 (two) times daily.    traMADol (ULTRAM) 50 MG tablet Take 1 tablet (50 mg total) by mouth every 8 (eight) hours as needed for severe pain.       11/27/2022    1:30 PM 09/17/2022    4:24 PM 08/04/2022   11:01 AM 02/02/2022    9:50 AM  GAD 7 : Generalized  Anxiety Score  Nervous, Anxious, on Edge 0 0 0 0  Control/stop worrying 0 0 0 0  Worry too much - different things 0 0 0 0  Trouble relaxing 0 0 0 0  Restless 0 0 0 0  Easily annoyed or irritable 0 0 0 0  Afraid - awful might happen 0 0 0 0  Total GAD 7 Score 0 0 0 0  Anxiety Difficulty Not difficult at all Not difficult at all Not difficult at all Not difficult at all       11/27/2022    1:29 PM 10/20/2022   11:06 AM 10/05/2022    2:06 PM  Depression screen PHQ 2/9  Decreased Interest 0 0 0  Down, Depressed,  Hopeless 0 0 0  PHQ - 2 Score 0 0 0  Altered sleeping 0    Tired, decreased energy 1    Change in appetite 0    Feeling bad or failure about yourself  0    Trouble concentrating 0    Moving slowly or fidgety/restless 0    Suicidal thoughts 0    PHQ-9 Score 1    Difficult doing work/chores Not difficult at all      BP Readings from Last 3 Encounters:  11/27/22 132/74  11/18/22 (!) 147/68  10/20/22 (!) 145/95    Physical Exam Vitals and nursing note reviewed.  Constitutional:      General: She is not in acute distress.    Appearance: She is well-developed.  HENT:     Head: Normocephalic and atraumatic.  Cardiovascular:     Rate and Rhythm: Normal rate and regular rhythm.  Pulmonary:     Effort: Pulmonary effort is normal. No respiratory distress.     Breath sounds: No wheezing or rhonchi.  Musculoskeletal:        General: Swelling (non pitting bilaterally) present.     Cervical back: Normal range of motion.  Skin:    General: Skin is warm and dry.     Findings: Bruising present. No rash.     Comments: On anterior left lower leg Also senile ecchymoses arms  Neurological:     Mental Status: She is alert and oriented to person, place, and time.  Psychiatric:        Mood and Affect: Mood normal.        Behavior: Behavior normal.     Wt Readings from Last 3 Encounters:  11/27/22 118 lb 6.4 oz (53.7 kg)  11/18/22 113 lb (51.3 kg)  10/20/22 113 lb (51.3  kg)    BP 132/74   Pulse 78   Ht 5\' 1"  (1.549 m)   Wt 118 lb 6.4 oz (53.7 kg)   SpO2 97%   BMI 22.37 kg/m   Assessment and Plan:  Problem List Items Addressed This Visit       Unprioritized   Essential (primary) hypertension - Primary (Chronic)    Normal exam with stable BP on lisinopril, amlodipine and lasix. No concerns or side effects to current medication. No change in regimen; continue low sodium diet.       Lateral femoral cutaneous entrapment syndrome, right    Improved after treatment by Pain management      Senile ecchymosis   Other Visit Diagnoses     Easy bruising           No follow-ups on file.    Reubin Milan, MD [facility] [place]

## 2022-12-03 ENCOUNTER — Ambulatory Visit
Payer: Medicare Other | Attending: Student in an Organized Health Care Education/Training Program | Admitting: Student in an Organized Health Care Education/Training Program

## 2022-12-03 ENCOUNTER — Encounter: Payer: Self-pay | Admitting: Student in an Organized Health Care Education/Training Program

## 2022-12-03 VITALS — BP 153/84 | HR 80 | Temp 97.3°F | Resp 18 | Ht 61.0 in | Wt 113.0 lb

## 2022-12-03 DIAGNOSIS — M461 Sacroiliitis, not elsewhere classified: Secondary | ICD-10-CM | POA: Insufficient documentation

## 2022-12-03 DIAGNOSIS — G894 Chronic pain syndrome: Secondary | ICD-10-CM | POA: Diagnosis not present

## 2022-12-03 DIAGNOSIS — M79651 Pain in right thigh: Secondary | ICD-10-CM | POA: Insufficient documentation

## 2022-12-03 DIAGNOSIS — G5711 Meralgia paresthetica, right lower limb: Secondary | ICD-10-CM | POA: Diagnosis not present

## 2022-12-03 MED ORDER — TRAMADOL HCL 50 MG PO TABS: 50.0000 mg | ORAL_TABLET | Freq: Three times a day (TID) | ORAL | 2 refills | Status: AC | PRN

## 2022-12-03 NOTE — Progress Notes (Signed)
PROVIDER NOTE: Information contained herein reflects review and annotations entered in association with encounter. Interpretation of such information and data should be left to medically-trained personnel. Information provided to patient can be located elsewhere in the medical record under "Patient Instructions". Document created using STT-dictation technology, any transcriptional errors that may result from process are unintentional.    Patient: Danielle Herrera  Service Category: E/M  Provider: Edward Jolly, MD  DOB: 1933-10-06  DOS: 12/03/2022  Referring Provider: Reubin Milan, MD  MRN: [id number]  Specialty: Interventional Pain Management  PCP: Reubin Milan, MD  Type: Established Patient  Setting: Ambulatory outpatient    Location: Office  Delivery: Face-to-face     HPI  Danielle Herrera, a 89 y.o. 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 Danielle Herrera primary complain today is Hip Pain (right)  Pertinent problems: Danielle Herrera has SI (sacroiliac) joint dysfunction; Bilateral primary osteoarthritis of hip; Piriformis syndrome of both sides; and Chronic pain syndrome on their pertinent problem list. Pain Assessment: Severity of Chronic pain is reported as a 2 /10. Location: Hip Right/denies. Onset: More than a month ago. Quality: Aching. Timing: Constant. Modifying factor(s): Tramadol, Advil. Vitals:  height is 5\' 1"  (1.549 m) and weight is 113 lb (51.3 kg). Her temporal temperature is 97.3 F (36.3 C) (abnormal). Her blood pressure is 153/84 (abnormal) and her pulse is 80. Her respiration is 18 and oxygen saturation is 99%.  BMI: Estimated body mass index is 21.35 kg/m as calculated from the following:   Height as of this encounter: 5\' 1"  (1.549 m).   Weight as of this encounter: 113 lb (51.3 kg). Last encounter: 10/26/2022. Last procedure: 11/18/2022.  Reason for encounter: both, medication management and  post-procedure evaluation and assessment.  Post-procedure evaluation    Procedure:          Anesthesia, Analgesia, Anxiolysis:  Type:  Lateral femoral cutaneous  Nerve Block          Primary Purpose: Diagnostic Region: Inferior to ASIS Target Area: Between the tensor fascia latte and sartorius muscle at the medial and inferior border Approach: Anterior approach Laterality: Right  Anesthesia: Local (1-2% Lidocaine)  Anxiolysis: None  Sedation: None  Guidance: Ultrasound           Position: Supine   1. Lateral femoral cutaneous entrapment syndrome, right   2. Pain of right lateral upper thigh    NAS-11 Pain score:   Pre-procedure: 2 /10   Post-procedure: 0-No pain/10      Effectiveness:  Initial hour after procedure: 0 %  Subsequent 4-6 hours post-procedure: 0 %  Analgesia past initial 6 hours: 75 %  Ongoing improvement:  Analgesic:  75% Function: Danielle Herrera reports improvement in function   Pharmacotherapy Assessment  Analgesic: Tramadol 50 mg TID PRN  Monitoring: [place] PMP: PDMP reviewed during this encounter.       Pharmacotherapy: No side-effects or adverse reactions reported. Compliance: No problems identified. Effectiveness: Clinically acceptable.  No notes on file  No results found for: "CBDTHCR" No results found for: "D8THCCBX" No results found for: "D9THCCBX"  UDS:  Summary  Date Value Ref Range Status  09/15/2022 Note  Final    Comment:    ==================================================================== Compliance Drug Analysis, Ur ==================================================================== Test                             Result  Flag       Units  Drug Present and Declared for Prescription Verification   Tramadol                       >5952        EXPECTED   ng/mg creat   O-Desmethyltramadol            >5952        EXPECTED   ng/mg creat   N-Desmethyltramadol            >5952        EXPECTED   ng/mg creat    Source of tramadol is a  prescription medication. O-desmethyltramadol    and N-desmethyltramadol are expected metabolites of tramadol.    Naproxen                       PRESENT      EXPECTED ==================================================================== Test                      Result    Flag   Units      Ref Range   Creatinine              84               mg/dL      >=16 ==================================================================== Declared Medications:  The flagging and interpretation on this report are based on the  following declared medications.  Unexpected results may arise from  inaccuracies in the declared medications.   **Note: The testing scope of this panel includes these medications:   Naproxen (Aleve)  Tramadol (Ultram)   **Note: The testing scope of this panel does not include the  following reported medications:   Amlodipine (Norvasc)  Furosemide (Lasix)  Lisinopril (Zestril)  Methimazole (Tapazole)  Multivitamin  Sennosides (Senokot)  Vitamin D3 ==================================================================== For clinical consultation, please call [phone]. ====================================================================       ROS  Constitutional: Denies any fever or chills Gastrointestinal: No reported hemesis, hematochezia, vomiting, or acute GI distress Musculoskeletal:  Right hip pain, improved from before Neurological: No reported episodes of acute onset apraxia, aphasia, dysarthria, agnosia, amnesia, paralysis, loss of coordination, or loss of consciousness  Medication Review  Multiple Vitamins-Minerals, amLODipine, cholecalciferol, furosemide, lisinopril, methimazole, multivitamin with minerals, naproxen sodium, sennosides-docusate sodium, and traMADol  History Review  Allergy: Danielle Herrera is allergic to tylenol [acetaminophen]. Drug: Danielle Herrera  reports no history of drug use. Alcohol:  reports current alcohol use of about 3.0 standard drinks  of alcohol per week. Tobacco:  reports that she has never smoked. She has never used smokeless tobacco. Social: Danielle Herrera  reports that she has never smoked. She has never used smokeless tobacco. She reports current alcohol use of about 3.0 standard drinks of alcohol per week. She reports that she does not use drugs. 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 Surgical: Danielle Herrera  has a past surgical history that includes Appendectomy; Acoustic neuroma resection (Right, 1985); Colon surgery; Shoulder  arthroscopy w/ acromial repair (Right, 29yrs ago); Tubal ligation; Parathyroidectomy (2017); Ventriculoperitoneal shunt (Right, 09/08/2012); Brain surgery (09/08/2012); Abdominal hysterectomy; Eye surgery (Right); Fracture surgery (Right); Excision/release bursa hip (Right, 10/29/2017); Cataract extraction (Bilateral); Breast excisional biopsy (Left); Breast excisional biopsy (Left); and Breast surgery (Left). Family: family history includes Breast cancer in her paternal aunt; Dementia in her mother; Diabetes in her sister; Heart disease in her brother and father; Hypertension in her father and mother; Mental illness in her mother; Stroke in her brother.  Laboratory Chemistry Profile   Renal Lab Results   Component Value Date   BUN 16 02/02/2022   CREATININE 0.97 02/02/2022   BCR 16 02/02/2022   GFRAA 60 01/23/2020   GFRNONAA 52 (L) 01/23/2020    Hepatic Lab Results  Component Value Date   AST 26 02/02/2022   ALT 17 02/02/2022   ALBUMIN 4.4 02/02/2022   ALKPHOS 91 02/02/2022   LIPASE 39 05/28/2017    Electrolytes Lab Results  Component Value Date   NA 144 02/02/2022   K 4.3 02/02/2022   CL 105 02/02/2022   CALCIUM 10.1 02/02/2022    Bone Lab Results  Component Value Date   VD25OH 45.1 02/02/2022    Inflammation (CRP: Acute Phase) (ESR: Chronic Phase) No results found for: "CRP", "ESRSEDRATE", "LATICACIDVEN"       Note: Above Lab results reviewed.    Physical Exam  General appearance: Well nourished, well developed, and well hydrated. In no apparent acute distress Mental status: Alert, oriented x 3 (person, place, & time)       Respiratory: No evidence of acute respiratory distress Eyes: PERLA Vitals: BP (!) 153/84   Pulse 80   Temp (!) 97.3 F (36.3 C) (Temporal)   Resp 18   Ht 5\' 1"  (1.549 m)   Wt 113 lb (51.3 kg)   SpO2 99%   BMI 21.35 kg/m  BMI: Estimated body mass index is 21.35 kg/m as calculated from the following:   Height as of this encounter: 5\' 1"  (1.549 m).   Weight as of this encounter: 113 lb (51.3 kg). Ideal: Ideal body weight: 47.8 kg (105 lb 6.1 oz) Adjusted ideal body weight: 49.2 kg (108 lb 6.8 oz)  Improvement in right hip pain  Assessment   Diagnosis Status  1. Lateral femoral cutaneous entrapment syndrome, right   2. Pain of right lateral upper thigh   3. SI joint arthritis (HCC)   4. Chronic pain syndrome    Improved Improved Controlled     Plan of Care   Excellent response to right lateral femoral cutaneous nerve block.  Patient states that she has significantly less radiating right leg pain.  Continue to monitor.  Repeat or consider pulsed radiofrequency ablation in future Pharmacotherapy (Medications Ordered): Meds  ordered this encounter  Medications   traMADol (ULTRAM) 50 MG tablet    Sig: Take 1 tablet (50 mg total) by mouth every 8 (eight) hours as needed for severe pain.    Dispense:  90 tablet    Refill:  2    Fill one day early if pharmacy is closed on scheduled refill date.   Orders:  No orders of the defined types were placed in this encounter.  Follow-up plan:   Return for patient will call to schedule F2F appt prn.      Bilateral SI joint and piriformis 09/23/2022, R LFCN 9/18/      Recent Visits Date Type Provider Dept  11/18/22 Procedure visit Edward Jolly, MD [facility]  10/26/22 Office Visit Edward Jolly, MD [facility]  10/20/22 Office Visit Edward Jolly, MD [facility]  10/05/22 Office Visit Edward Jolly, MD [facility]  09/23/22 Procedure visit Edward Jolly, MD [facility]  09/15/22 Office Visit Edward Jolly, MD [facility]  Showing recent visits within past 90 days and meeting all other requirements Today's Visits Date Type Provider Dept  12/03/22 Office Visit Edward Jolly, MD [facility]  Showing today's visits and meeting all other requirements Future Appointments No visits were found meeting these conditions. Showing future appointments within next 90 days and meeting all other requirements  I discussed the assessment and treatment plan with the patient. The patient was provided an opportunity to ask questions and all were answered. The patient agreed with the plan and demonstrated an understanding of the instructions.  Patient advised to call back or seek an in-person evaluation if the symptoms or condition worsens.  Duration of encounter: 30 minutes.  Total time on encounter, as per AMA guidelines included both the face-to-face and non-face-to-face time personally spent by the physician and/or other qualified health care professional(s) on the day of the encounter (includes time in  activities that require the physician or other qualified health care professional and does not include time in activities normally performed by clinical staff). Physician's time may include the following activities when performed: Preparing to see the patient (e.g., pre-charting review of records, searching for previously ordered imaging, lab work, and nerve conduction tests) Review of prior analgesic pharmacotherapies. Reviewing PMP Interpreting ordered tests (e.g., lab work, imaging, nerve conduction tests) Performing post-procedure evaluations, including interpretation of diagnostic procedures Obtaining and/or reviewing separately obtained history Performing a medically appropriate examination and/or evaluation Counseling and educating the patient/family/caregiver Ordering medications, tests, or procedures Referring and communicating with other health care professionals (when not separately reported) Documenting clinical information in the electronic or other health record Independently interpreting results (not separately reported) and communicating results to the patient/ family/caregiver Care coordination (not separately reported)  Note by: Edward Jolly, MD Date: 12/03/2022; Time: 12:01 PM

## 2022-12-04 DIAGNOSIS — Z23 Encounter for immunization: Secondary | ICD-10-CM | POA: Diagnosis not present

## 2022-12-19 DIAGNOSIS — Z23 Encounter for immunization: Secondary | ICD-10-CM | POA: Diagnosis not present

## 2022-12-31 ENCOUNTER — Encounter: Payer: Medicare Other | Admitting: Student in an Organized Health Care Education/Training Program

## 2023-01-05 ENCOUNTER — Encounter: Payer: Medicare Other | Admitting: Student in an Organized Health Care Education/Training Program

## 2023-01-11 ENCOUNTER — Other Ambulatory Visit: Payer: Self-pay | Admitting: Internal Medicine

## 2023-01-11 DIAGNOSIS — C4491 Basal cell carcinoma of skin, unspecified: Secondary | ICD-10-CM | POA: Diagnosis not present

## 2023-01-11 DIAGNOSIS — C44519 Basal cell carcinoma of skin of other part of trunk: Secondary | ICD-10-CM | POA: Diagnosis not present

## 2023-01-12 NOTE — Telephone Encounter (Signed)
Requested medication (s) are due for refill today: yes  Requested medication (s) are on the active medication list: yes  Last refill:  09/16/22  Future visit scheduled: yes  Notes to clinic:  Unable to refill per protocol due to failed labs, no updated results.      Requested Prescriptions  Pending Prescriptions Disp Refills   furosemide (LASIX) 20 MG tablet [Pharmacy Med Name: FUROSEMIDE 20 MG TABLET] 90 tablet 1    Sig: TAKE 1 TABLET BY MOUTH EVERY DAY     Cardiovascular:  Diuretics - Loop Failed - 01/11/2023 11:58 AM      Failed - K in normal range and within 180 days    Potassium  Date Value Ref Range Status  02/02/2022 4.3 3.5 - 5.2 mmol/L Final  07/23/2012 3.4 (L) 3.5 - 5.1 mmol/L Final         Failed - Ca in normal range and within 180 days    Calcium  Date Value Ref Range Status  02/02/2022 10.1 8.7 - 10.3 mg/dL Final   Calcium, Total  Date Value Ref Range Status  07/23/2012 9.8 8.5 - 10.1 mg/dL Final         Failed - Na in normal range and within 180 days    Sodium  Date Value Ref Range Status  02/02/2022 144 134 - 144 mmol/L Final  07/23/2012 142 136 - 145 mmol/L Final         Failed - Cr in normal range and within 180 days    Creatinine  Date Value Ref Range Status  07/23/2012 0.55 (L) 0.60 - 1.30 mg/dL Final   Creatinine, Ser  Date Value Ref Range Status  02/02/2022 0.97 0.57 - 1.00 mg/dL Final         Failed - Cl in normal range and within 180 days    Chloride  Date Value Ref Range Status  02/02/2022 105 96 - 106 mmol/L Final  07/23/2012 110 (H) 98 - 107 mmol/L Final         Failed - Mg Level in normal range and within 180 days    No results found for: "MG"       Failed - Last BP in normal range    BP Readings from Last 1 Encounters:  12/03/22 (!) 153/84         Passed - Valid encounter within last 6 months    Recent Outpatient Visits           1 month ago Essential (primary) hypertension   [facility], Nyoka Cowden, MD   3 months ago Dependent edema   [place] [facility], [facility]   5 months ago Essential (primary) hypertension   [place] [facility] at [facility], Nyoka Cowden, MD   11 months ago Essential (primary) hypertension   [place] [facility] at [facility], Nyoka Cowden, MD   1 year ago Hemoptysis   [facility] at [facility], Nyoka Cowden, MD       Future Appointments             In 3 weeks Judithann Graves Nyoka Cowden, MD [facility] at [facility], [facility]

## 2023-01-26 DIAGNOSIS — D0439 Carcinoma in situ of skin of other parts of face: Secondary | ICD-10-CM | POA: Diagnosis not present

## 2023-01-26 DIAGNOSIS — C44329 Squamous cell carcinoma of skin of other parts of face: Secondary | ICD-10-CM | POA: Diagnosis not present

## 2023-01-27 ENCOUNTER — Other Ambulatory Visit: Payer: Self-pay | Admitting: Internal Medicine

## 2023-01-27 DIAGNOSIS — Z1231 Encounter for screening mammogram for malignant neoplasm of breast: Secondary | ICD-10-CM

## 2023-02-04 ENCOUNTER — Encounter: Payer: Self-pay | Admitting: Internal Medicine

## 2023-02-04 ENCOUNTER — Ambulatory Visit (INDEPENDENT_AMBULATORY_CARE_PROVIDER_SITE_OTHER): Payer: Medicare Other | Admitting: Internal Medicine

## 2023-02-04 VITALS — BP 122/76 | HR 85 | Ht 61.0 in | Wt 115.0 lb

## 2023-02-04 DIAGNOSIS — E782 Mixed hyperlipidemia: Secondary | ICD-10-CM | POA: Diagnosis not present

## 2023-02-04 DIAGNOSIS — G894 Chronic pain syndrome: Secondary | ICD-10-CM | POA: Diagnosis not present

## 2023-02-04 DIAGNOSIS — N1831 Chronic kidney disease, stage 3a: Secondary | ICD-10-CM

## 2023-02-04 DIAGNOSIS — Z982 Presence of cerebrospinal fluid drainage device: Secondary | ICD-10-CM

## 2023-02-04 DIAGNOSIS — I10 Essential (primary) hypertension: Secondary | ICD-10-CM

## 2023-02-04 DIAGNOSIS — E559 Vitamin D deficiency, unspecified: Secondary | ICD-10-CM

## 2023-02-04 DIAGNOSIS — E0590 Thyrotoxicosis, unspecified without thyrotoxic crisis or storm: Secondary | ICD-10-CM | POA: Diagnosis not present

## 2023-02-04 MED ORDER — LISINOPRIL 10 MG PO TABS: 10.0000 mg | ORAL_TABLET | Freq: Every day | ORAL | 1 refills | Status: DC

## 2023-02-04 MED ORDER — FUROSEMIDE 20 MG PO TABS: 20.0000 mg | ORAL_TABLET | Freq: Every day | ORAL | 1 refills | Status: DC

## 2023-02-04 NOTE — Assessment & Plan Note (Addendum)
Treated by pain management Dr. Cherylann Ratel with injections and Tramadol.

## 2023-02-04 NOTE — Assessment & Plan Note (Signed)
Controlled BP with normal exam. Current regimen is lasix, amlodipine and lisinopril. Will continue same medications; encourage continued reduced sodium diet.

## 2023-02-04 NOTE — Assessment & Plan Note (Addendum)
Being followed by Endocrinology at [facility].   On tapazole Lab Results  Component Value Date   TSH 0.74 04/08/2022

## 2023-02-04 NOTE — Assessment & Plan Note (Signed)
Will continue to monitor intermittently - GFR has been stable for some time Advise limiting nsaids and maintaining hydration.

## 2023-02-04 NOTE — Assessment & Plan Note (Signed)
Lipids managed with diet alone. Lab Results  Component Value Date   LDLCALC 173 (H) 02/02/2022

## 2023-02-04 NOTE — Progress Notes (Signed)
Date:  02/04/2023   Name:  Danielle Herrera   DOB:  1933-07-28   MRN:  [id number]   Chief Complaint: Annual Exam Danielle Herrera is a 89 y.o. female who presents today for her Complete Annual Exam. She feels well. She reports exercising some. She reports she is sleeping fairly well. Breast complaints - none.  Mammogram: scheduled 02/22/23 DEXA: 04/2021 osteopenia Colonoscopy: aged out  There are no preventive care reminders to display for this patient.  Immunization History  Administered Date(s) Administered   Fluad Quad(high Dose 65+) 11/08/2018   Influenza, High Dose Seasonal PF 11/22/2017, 11/07/2019, 11/25/2021, 12/04/2022   Influenza,inj,Quad PF,6+ Mos 12/23/2020   Influenza-Unspecified 11/26/2011, 11/30/2016, 11/21/2018, 12/23/2020   PFIZER Comirnaty(Gray Top)Covid-19 Tri-Sucrose Vaccine 03/15/2019, 04/06/2019, 11/27/2019, 06/06/2020   PFIZER(Purple Top)SARS-COV-2 Vaccination 11/25/2021   PNEUMOCOCCAL CONJUGATE-20 02/02/2022   Pfizer Covid-19 Vaccine Bivalent Booster 21yrs & up 12/30/2020   Pfizer(Comirnaty)Fall Seasonal Vaccine 12 years and older 12/19/2022   Pneumococcal Conjugate-13 04/24/2014, 03/02/2016   Pneumococcal Polysaccharide-23 05/31/2010, 07/01/2012, 11/21/2015   Pneumococcal-Unspecified 10/31/2012   Rsv, Bivalent, Protein Subunit Rsvpref,pf Verdis Frederickson) 06/22/2022   Tdap 08/13/2014   Unspecified SARS-COV-2 Vaccination 11/25/2021   Zoster Recombinant(Shingrix) 07/03/2016, 08/30/2016   Zoster, Live 03/02/2010     Hypertension This is a chronic problem. The problem is controlled. Associated symptoms include peripheral edema. Pertinent negatives include no anxiety, chest pain, headaches, malaise/fatigue, palpitations or shortness of breath. There are no associated agents to hypertension. Past treatments include diuretics, calcium channel blockers and ACE inhibitors. The current treatment provides significant improvement. Hypertensive end-organ damage includes  kidney disease. There is no history of CAD/MI or CVA.    Review of Systems  Constitutional:  Negative for fatigue, fever, malaise/fatigue and unexpected weight change.  HENT:  Negative for trouble swallowing.   Respiratory:  Negative for shortness of breath.   Cardiovascular:  Positive for leg swelling. Negative for chest pain and palpitations.  Gastrointestinal:  Negative for abdominal pain, constipation, nausea and vomiting.  Genitourinary:  Negative for dysuria.  Musculoskeletal:  Positive for arthralgias and back pain.  Neurological:  Negative for headaches.  Psychiatric/Behavioral:  Negative for dysphoric mood and sleep disturbance. The patient is not nervous/anxious.      Lab Results  Component Value Date   NA 144 02/02/2022   K 4.3 02/02/2022   CO2 22 02/02/2022   GLUCOSE 93 02/02/2022   BUN 16 02/02/2022   CREATININE 0.97 02/02/2022   CALCIUM 10.1 02/02/2022   EGFR 57 (L) 02/02/2022   GFRNONAA 52 (L) 01/23/2020   Lab Results  Component Value Date   CHOL 270 (H) 02/02/2022   HDL 80 02/02/2022   LDLCALC 173 (H) 02/02/2022   TRIG 101 02/02/2022   CHOLHDL 3.4 02/02/2022   Lab Results  Component Value Date   TSH 0.74 04/08/2022   No results found for: "HGBA1C" Lab Results  Component Value Date   WBC 6.7 02/02/2022   HGB 14.0 02/02/2022   HCT 41.9 02/02/2022   MCV 88 02/02/2022   PLT 318 02/02/2022   Lab Results  Component Value Date   ALT 17 02/02/2022   AST 26 02/02/2022   ALKPHOS 91 02/02/2022   BILITOT 0.3 02/02/2022   Lab Results  Component Value Date   VD25OH 45.1 02/02/2022     Patient Active Problem List   Diagnosis Date Noted   Lateral femoral cutaneous entrapment syndrome, right 11/27/2022   Senile ecchymosis 11/27/2022   SI (sacroiliac) joint dysfunction 09/15/2022   Bilateral  primary osteoarthritis of hip 09/15/2022   Piriformis syndrome of both sides 09/15/2022   Chronic pain syndrome 09/15/2022   CKD stage 3a, GFR 45-59 ml/min (HCC)  08/04/2022   Enthesopathy of hip region on both sides 08/04/2022   Dysphagia 06/09/2022   Slow transit constipation 01/23/2020   Encounter for long-term (current) use of NSAIDs 05/26/2019   Benign essential tremor 03/10/2018   Insomnia 07/12/2017   Trochanteric bursitis of right hip 01/12/2017   Pseudophakia of right eye 12/12/2015   History of parathyroidectomy 07/10/2015   Hyperthyroidism, subclinical 04/22/2015   Hyperlipidemia, mixed 08/13/2014   Vitamin D deficiency 08/13/2014   Ventricular shunt in place 08/13/2014   Esophageal reflux 08/13/2014   Normal pressure hydrocephalus (HCC) 11/05/2012   Abnormal gait 08/26/2012   Goiter, nontoxic, multinodular 05/23/2012   History of acoustic neuroma 01/20/2010   Colon polyp 01/20/2010   Dependent edema 01/20/2010   Essential (primary) hypertension 01/20/2010   Arthritis of knee, left 01/20/2010   Osteopenia 01/20/2010    Allergies  Allergen Reactions   Tylenol [Acetaminophen] Other (See Comments)    Elevated LFTs    Past Surgical History:  Procedure Laterality Date   ABDOMINAL HYSTERECTOMY     ACOUSTIC NEUROMA RESECTION Right 1985   APPENDECTOMY     BRAIN SURGERY  09/08/2012   burr hole with biopsy; cranial tongs caliper/stereotactic frame right (Dr. Ladoris Gene)   BREAST EXCISIONAL BIOPSY Left    benign   BREAST EXCISIONAL BIOPSY Left    benign   BREAST SURGERY Left    lumpectomy   CATARACT EXTRACTION Bilateral    right eye several years ago; left eye fall 2021   COLON SURGERY     EXCISION/RELEASE BURSA HIP Right 10/29/2017   Procedure: EXCISION/RELEASE BURSA HIP- IT BAND;  Surgeon: Signa Kell, MD;  Location: [facility] ORS;  Service: Orthopedics;  Laterality: Right;   EYE SURGERY Right    cataract   FRACTURE SURGERY Right    shoulder   PARATHYROIDECTOMY  2017   partial for hyperparathyroidism   SHOULDER ARTHROSCOPY W/ ACROMIAL REPAIR Right 10yrs ago   TUBAL LIGATION     VENTRICULOPERITONEAL SHUNT Right  09/08/2012   for NPH    Social History   Tobacco Use   Smoking status: Never   Smokeless tobacco: Never  Vaping Use   Vaping status: Never Used  Substance Use Topics   Alcohol use: Yes    Alcohol/week: 3.0 standard drinks of alcohol    Types: 3 Glasses of wine per week    Comment: Occasionally   Drug use: Never     Medication list has been reviewed and updated.  Current Meds  Medication Sig   amLODipine (NORVASC) 10 MG tablet TAKE 1 TABLET BY MOUTH EVERY DAY   cholecalciferol (VITAMIN D3) 25 MCG (1000 UNIT) tablet Take 1,000 Units by mouth daily.   methimazole (TAPAZOLE) 5 MG tablet Take 5 mg by mouth daily. Dr. Alben Spittle- [facility].   Multiple Vitamin (MULTIVITAMIN WITH MINERALS) TABS tablet Take 1 tablet by mouth daily. Centrum Silver for Women 50+   Multiple Vitamins-Minerals (PRESERVISION AREDS 2 PO) Take 1 each by mouth 2 (two) times daily.   naproxen sodium (ALEVE) 220 MG tablet Take 4 tablets by mouth daily.   sennosides-docusate sodium (SENOKOT-S) 8.6-50 MG tablet Take 2 tablets by mouth 2 (two) times daily.    traMADol (ULTRAM) 50 MG tablet Take 1 tablet (50 mg total) by mouth every 8 (eight) hours as needed for severe pain.   [  DISCONTINUED] furosemide (LASIX) 20 MG tablet TAKE 1 TABLET BY MOUTH EVERY DAY   [DISCONTINUED] lisinopril (ZESTRIL) 10 MG tablet TAKE 1 TABLET BY MOUTH EVERY DAY       02/04/2023    9:14 AM 11/27/2022    1:30 PM 09/17/2022    4:24 PM 08/04/2022   11:01 AM  GAD 7 : Generalized Anxiety Score  Nervous, Anxious, on Edge 0 0 0 0  Control/stop worrying 0 0 0 0  Worry too much - different things 0 0 0 0  Trouble relaxing 0 0 0 0  Restless 0 0 0 0  Easily annoyed or irritable 0 0 0 0  Afraid - awful might happen 0 0 0 0  Total GAD 7 Score 0 0 0 0  Anxiety Difficulty Not difficult at all Not difficult at all Not difficult at all Not difficult at all       02/04/2023    9:14 AM 12/03/2022   11:30 AM 11/27/2022    1:29 PM  Depression screen PHQ  2/9  Decreased Interest 0 0 0  Down, Depressed, Hopeless 0 0 0  PHQ - 2 Score 0 0 0  Altered sleeping 0  0  Tired, decreased energy 0  1  Change in appetite 0  0  Feeling bad or failure about yourself  0  0  Trouble concentrating 0  0  Moving slowly or fidgety/restless 0  0  Suicidal thoughts 0  0  PHQ-9 Score 0  1  Difficult doing work/chores Not difficult at all  Not difficult at all    BP Readings from Last 3 Encounters:  02/04/23 122/76  12/03/22 (!) 153/84  11/27/22 132/74    Physical Exam Vitals and nursing note reviewed.  Constitutional:      General: She is not in acute distress.    Appearance: She is well-developed.  HENT:     Head: Normocephalic and atraumatic.      Right Ear: Tympanic membrane and ear canal normal.     Left Ear: Tympanic membrane and ear canal normal.     Nose:     Right Sinus: No maxillary sinus tenderness.     Left Sinus: No maxillary sinus tenderness.  Eyes:     General: No scleral icterus.       Right eye: No discharge.        Left eye: No discharge.     Conjunctiva/sclera: Conjunctivae normal.  Neck:     Thyroid: No thyromegaly.     Vascular: No carotid bruit.  Cardiovascular:     Rate and Rhythm: Normal rate and regular rhythm.     Pulses: Normal pulses.     Heart sounds: Normal heart sounds.  Pulmonary:     Effort: Pulmonary effort is normal. No respiratory distress.     Breath sounds: No wheezing.  Abdominal:     General: Bowel sounds are normal.     Palpations: Abdomen is soft.     Tenderness: There is no abdominal tenderness.  Musculoskeletal:     Cervical back: Normal range of motion. No erythema.     Right lower leg: No edema.     Left lower leg: No edema.  Lymphadenopathy:     Cervical: No cervical adenopathy.  Skin:    General: Skin is warm and dry.     Capillary Refill: Capillary refill takes less than 2 seconds.     Findings: No rash.  Neurological:     Mental Status: She is  alert and oriented to person,  place, and time.     Cranial Nerves: No cranial nerve deficit.     Sensory: No sensory deficit.     Deep Tendon Reflexes: Reflexes are normal and symmetric.  Psychiatric:        Attention and Perception: Attention normal.        Mood and Affect: Mood normal.     Wt Readings from Last 3 Encounters:  02/04/23 115 lb (52.2 kg)  12/03/22 113 lb (51.3 kg)  11/27/22 118 lb 6.4 oz (53.7 kg)    BP 122/76   Pulse 85   Ht 5\' 1"  (1.549 m)   Wt 115 lb (52.2 kg)   BMI 21.73 kg/m   Assessment and Plan:  Problem List Items Addressed This Visit       Unprioritized   Hyperlipidemia, mixed (Chronic)    Lipids managed with diet alone. Lab Results  Component Value Date   LDLCALC 173 (H) 02/02/2022         Relevant Medications   furosemide (LASIX) 20 MG tablet   lisinopril (ZESTRIL) 10 MG tablet   Other Relevant Orders   Lipid panel   Essential (primary) hypertension - Primary (Chronic)    Controlled BP with normal exam. Current regimen is lasix, amlodipine and lisinopril. Will continue same medications; encourage continued reduced sodium diet.       Relevant Medications   furosemide (LASIX) 20 MG tablet   lisinopril (ZESTRIL) 10 MG tablet   Other Relevant Orders   CBC with Differential/Platelet   Comprehensive metabolic panel   Urinalysis, Routine w reflex microscopic   Hyperthyroidism, subclinical (Chronic)    Being followed by Endocrinology at [facility].   On tapazole Lab Results  Component Value Date   TSH 0.74 04/08/2022         Relevant Orders   TSH + free T4   Vitamin D deficiency    On daily supplement. Last vitamin D 45      Relevant Orders   VITAMIN D 25 Hydroxy (Vit-D Deficiency, Fractures)   Ventricular shunt in place   CKD stage 3a, GFR 45-59 ml/min (HCC)    Will continue to monitor intermittently - GFR has been stable for some time Advise limiting nsaids and maintaining hydration.      Relevant Orders   Comprehensive metabolic panel   Phosphorus    Chronic pain syndrome    Treated by pain management Dr. Cherylann Ratel with injections and Tramadol.       Return in about 6 months (around 08/05/2023) for HTN.    Reubin Milan, MD [facility] [place]

## 2023-02-04 NOTE — Assessment & Plan Note (Signed)
On daily supplement. Last vitamin D 45

## 2023-02-05 ENCOUNTER — Telehealth: Payer: Self-pay

## 2023-02-05 LAB — CBC WITH DIFFERENTIAL/PLATELET
Basophils Absolute: 0.1 10*3/uL (ref 0.0–0.2)
Basos: 1 %
EOS (ABSOLUTE): 0.2 10*3/uL (ref 0.0–0.4)
Eos: 2 %
Hematocrit: 41.1 % (ref 34.0–46.6)
Hemoglobin: 13.4 g/dL (ref 11.1–15.9)
Immature Grans (Abs): 0 10*3/uL (ref 0.0–0.1)
Immature Granulocytes: 0 %
Lymphocytes Absolute: 1.9 10*3/uL (ref 0.7–3.1)
Lymphs: 26 %
MCH: 29.1 pg (ref 26.6–33.0)
MCHC: 32.6 g/dL (ref 31.5–35.7)
MCV: 89 fL (ref 79–97)
Monocytes Absolute: 0.6 10*3/uL (ref 0.1–0.9)
Monocytes: 9 %
Neutrophils Absolute: 4.3 10*3/uL (ref 1.4–7.0)
Neutrophils: 62 %
Platelets: 297 10*3/uL (ref 150–450)
RBC: 4.6 x10E6/uL (ref 3.77–5.28)
RDW: 12.3 % (ref 11.7–15.4)
WBC: 7 10*3/uL (ref 3.4–10.8)

## 2023-02-05 LAB — URINALYSIS, ROUTINE W REFLEX MICROSCOPIC
Bilirubin, UA: NEGATIVE
Glucose, UA: NEGATIVE
Ketones, UA: NEGATIVE
Leukocytes,UA: NEGATIVE
Nitrite, UA: NEGATIVE
Protein,UA: NEGATIVE
RBC, UA: NEGATIVE
Specific Gravity, UA: 1.013 (ref 1.005–1.030)
Urobilinogen, Ur: 0.2 mg/dL (ref 0.2–1.0)
pH, UA: 7 (ref 5.0–7.5)

## 2023-02-05 LAB — LIPID PANEL
Chol/HDL Ratio: 2.7 {ratio} (ref 0.0–4.4)
Cholesterol, Total: 267 mg/dL — ABNORMAL HIGH (ref 100–199)
HDL: 98 mg/dL (ref >39)
LDL Chol Calc (NIH): 154 mg/dL — ABNORMAL HIGH (ref 0–99)
Triglycerides: 91 mg/dL (ref 0–149)
VLDL Cholesterol Cal: 15 mg/dL (ref 5–40)

## 2023-02-05 LAB — COMPREHENSIVE METABOLIC PANEL
ALT: 13 [IU]/L (ref 0–32)
AST: 26 [IU]/L (ref 0–40)
Albumin: 4.5 g/dL (ref 3.7–4.7)
Alkaline Phosphatase: 74 [IU]/L (ref 44–121)
BUN/Creatinine Ratio: 15 (ref 12–28)
BUN: 17 mg/dL (ref 8–27)
Bilirubin Total: 0.3 mg/dL (ref 0.0–1.2)
CO2: 25 mmol/L (ref 20–29)
Calcium: 10 mg/dL (ref 8.7–10.3)
Chloride: 104 mmol/L (ref 96–106)
Creatinine, Ser: 1.16 mg/dL — ABNORMAL HIGH (ref 0.57–1.00)
Globulin, Total: 2.2 g/dL (ref 1.5–4.5)
Glucose: 87 mg/dL (ref 70–99)
Potassium: 4 mmol/L (ref 3.5–5.2)
Sodium: 144 mmol/L (ref 134–144)
Total Protein: 6.7 g/dL (ref 6.0–8.5)
eGFR: 46 mL/min/{1.73_m2} — ABNORMAL LOW (ref >59)

## 2023-02-05 LAB — TSH+FREE T4
Free T4: 1.33 ng/dL (ref 0.82–1.77)
TSH: 0.584 u[IU]/mL (ref 0.450–4.500)

## 2023-02-05 LAB — PHOSPHORUS: Phosphorus: 3.1 mg/dL (ref 3.0–4.3)

## 2023-02-05 LAB — VITAMIN D 25 HYDROXY (VIT D DEFICIENCY, FRACTURES): Vit D, 25-Hydroxy: 47 ng/mL (ref 30.0–100.0)

## 2023-02-05 NOTE — Telephone Encounter (Signed)
Patient called for lab results.  Shared provider's note. No questions.  Reubin Milan, MD 02/05/2023  1:01 PM EST     Cholesterol is slightly lower.  Renal function is stable.  All associated labs are normal.  Continue same medications.  Thyroid is stable.

## 2023-02-22 ENCOUNTER — Ambulatory Visit
Admission: RE | Admit: 2023-02-22 | Discharge: 2023-02-22 | Disposition: A | Discharge status: Home / Self Care | Payer: Medicare Other | Source: Ambulatory Visit | Attending: Internal Medicine | Admitting: Internal Medicine

## 2023-02-22 DIAGNOSIS — Z1231 Encounter for screening mammogram for malignant neoplasm of breast: Secondary | ICD-10-CM | POA: Diagnosis not present

## 2023-03-18 DIAGNOSIS — G912 (Idiopathic) normal pressure hydrocephalus: Secondary | ICD-10-CM | POA: Diagnosis not present

## 2023-03-30 ENCOUNTER — Encounter: Payer: Medicare Other | Admitting: Student in an Organized Health Care Education/Training Program

## 2023-03-31 DIAGNOSIS — M9903 Segmental and somatic dysfunction of lumbar region: Secondary | ICD-10-CM | POA: Diagnosis not present

## 2023-03-31 DIAGNOSIS — M9905 Segmental and somatic dysfunction of pelvic region: Secondary | ICD-10-CM | POA: Diagnosis not present

## 2023-03-31 DIAGNOSIS — M5441 Lumbago with sciatica, right side: Secondary | ICD-10-CM | POA: Diagnosis not present

## 2023-03-31 DIAGNOSIS — M955 Acquired deformity of pelvis: Secondary | ICD-10-CM | POA: Diagnosis not present

## 2023-04-04 LAB — HM DEXA SCAN

## 2023-04-12 DIAGNOSIS — E042 Nontoxic multinodular goiter: Secondary | ICD-10-CM | POA: Diagnosis not present

## 2023-04-12 DIAGNOSIS — Z13820 Encounter for screening for osteoporosis: Secondary | ICD-10-CM | POA: Diagnosis not present

## 2023-04-12 DIAGNOSIS — E559 Vitamin D deficiency, unspecified: Secondary | ICD-10-CM | POA: Diagnosis not present

## 2023-04-12 DIAGNOSIS — M810 Age-related osteoporosis without current pathological fracture: Secondary | ICD-10-CM | POA: Diagnosis not present

## 2023-04-12 DIAGNOSIS — M8580 Other specified disorders of bone density and structure, unspecified site: Secondary | ICD-10-CM | POA: Diagnosis not present

## 2023-04-12 DIAGNOSIS — M8589 Other specified disorders of bone density and structure, multiple sites: Secondary | ICD-10-CM | POA: Diagnosis not present

## 2023-04-12 DIAGNOSIS — E0590 Thyrotoxicosis, unspecified without thyrotoxic crisis or storm: Secondary | ICD-10-CM | POA: Diagnosis not present

## 2023-04-13 ENCOUNTER — Encounter: Payer: Self-pay | Admitting: Student in an Organized Health Care Education/Training Program

## 2023-04-13 ENCOUNTER — Ambulatory Visit
Payer: Medicare Other | Attending: Student in an Organized Health Care Education/Training Program | Admitting: Student in an Organized Health Care Education/Training Program

## 2023-04-13 VITALS — BP 141/71 | HR 74 | Temp 96.8°F | Ht 61.0 in | Wt 113.0 lb

## 2023-04-13 DIAGNOSIS — M461 Sacroiliitis, not elsewhere classified: Secondary | ICD-10-CM | POA: Insufficient documentation

## 2023-04-13 DIAGNOSIS — G5711 Meralgia paresthetica, right lower limb: Secondary | ICD-10-CM | POA: Insufficient documentation

## 2023-04-13 DIAGNOSIS — M79651 Pain in right thigh: Secondary | ICD-10-CM | POA: Insufficient documentation

## 2023-04-13 DIAGNOSIS — G894 Chronic pain syndrome: Secondary | ICD-10-CM | POA: Diagnosis not present

## 2023-04-13 MED ORDER — TRAMADOL HCL 50 MG PO TABS: 50.0000 mg | ORAL_TABLET | Freq: Four times a day (QID) | ORAL | 2 refills | Status: AC | PRN

## 2023-04-13 NOTE — Progress Notes (Signed)
Safety precautions to be maintained throughout the outpatient stay will include: orient to surroundings, keep bed in low position, maintain call bell within reach at all times, provide assistance with transfer out of bed and ambulation.   Nursing Pain Medication Assessment:  Safety precautions to be maintained throughout the outpatient stay will include: orient to surroundings, keep bed in low position, maintain call bell within reach at all times, provide assistance with transfer out of bed and ambulation.  Medication Inspection Compliance: Danielle Herrera did not comply with our request to bring her pills to be counted. She was reminded that bringing the medication bottles, even when empty, is a requirement.  Medication: Tramadol (Ultram) Pill/Patch Count: No pills available to be counted. Pill/Patch Appearance: No markings Bottle Appearance: No container available. Did not bring bottle(s) to appointment. Filled Date: 0 / 0 /  no pills available Last Medication intake:  Today

## 2023-04-13 NOTE — Patient Instructions (Signed)

## 2023-04-13 NOTE — Progress Notes (Signed)
PROVIDER NOTE: Information contained herein reflects review and annotations entered in association with encounter. Interpretation of such information and data should be left to medically-trained personnel. Information provided to patient can be located elsewhere in the medical record under "Patient Instructions". Document created using STT-dictation technology, any transcriptional errors that may result from process are unintentional.    Patient: Danielle Herrera  Service Category: E/M  Provider: Edward Jolly, MD  DOB: [birth year]  DOS: 04/13/2023  Referring Provider: Reubin Milan, MD  MRN: [id number]  Specialty: Interventional Pain Management  PCP: Reubin Milan, MD  Type: Established Patient  Setting: Ambulatory outpatient    Location: Office  Delivery: Face-to-face     HPI  [name], a 89 y.o. 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 [name] primary complain today is Back Pain (lower)  Pertinent problems: [name] has SI (sacroiliac) joint dysfunction; Bilateral primary osteoarthritis of hip; Piriformis syndrome of both sides; and Chronic pain syndrome on their pertinent problem list. Pain Assessment: Severity of Chronic pain is reported as a 5 /10. Location: Back Lower/radiates to both hips. Onset: More than a month ago. Quality: Constant. Timing: Constant. Modifying factor(s): meds,. Vitals:  height is 5\' 1"  (1.549 m) and weight is 113 lb (51.3 kg). Her temperature is 96.8 F (36 C) (abnormal). Her blood pressure is 141/71 (abnormal) and her pulse is 74. Her oxygen saturation is 100%.  BMI: Estimated body mass index is 21.35 kg/m as calculated from the following:   Height as of this encounter: 5\' 1"  (1.549 m).   Weight as of this encounter: 113 lb (51.3 kg). Last encounter: 10/26/2022. Last procedure: 11/18/2022.  Reason for encounter: medication management.  No change in medical history since  last visit.  Patient's pain is at baseline.  Patient continues multimodal pain regimen as prescribed.  States that it provides pain relief and improvement in functional status. Patient is continuing to experience relief from her right lateral femoral cutaneous nerve block.  She states that her evenings are not as painful.  Will continue to monitor and repeat as needed.  Pharmacotherapy Assessment  Analgesic: Tramadol 50 mg BID PRN  Monitoring: [place] PMP: PDMP reviewed during this encounter.       Pharmacotherapy: No side-effects or adverse reactions reported. Compliance: No problems identified. Effectiveness: Clinically acceptable.  [name], RN  04/13/2023 11:16 AM  Sign when Signing Visit Safety precautions to be maintained throughout the outpatient stay will include: orient to surroundings, keep bed in low position, maintain call bell within reach at all times, provide assistance with transfer out of bed and ambulation.   Nursing Pain Medication Assessment:  Safety precautions to be maintained throughout the outpatient stay will include: orient to surroundings, keep bed in low position, maintain call bell within reach at all times, provide assistance with transfer out of bed and ambulation.  Medication Inspection Compliance: Ms. Hink did not comply with our request to bring her pills to be counted. She was reminded that bringing the medication bottles, even when empty, is a requirement.  Medication: Tramadol (Ultram) Pill/Patch Count: No pills available to be counted. Pill/Patch Appearance: No markings Bottle Appearance: No container available. Did not bring bottle(s) to appointment. Filled Date: 0 / 0 /  no pills available Last Medication intake:  Today    No results found for: "CBDTHCR" No results found for: "D8THCCBX" No results found for: "D9THCCBX"  UDS:  Summary  Date  Value Ref Range Status  09/15/2022 Note  Final    Comment:     ==================================================================== Compliance Drug Analysis, Ur ==================================================================== Test                             Result       Flag       Units  Drug Present and Declared for Prescription Verification   Tramadol                       >5952        EXPECTED   ng/mg creat   O-Desmethyltramadol            >5952        EXPECTED   ng/mg creat   N-Desmethyltramadol            >5952        EXPECTED   ng/mg creat    Source of tramadol is a prescription medication. O-desmethyltramadol    and N-desmethyltramadol are expected metabolites of tramadol.    Naproxen                       PRESENT      EXPECTED ==================================================================== Test                      Result    Flag   Units      Ref Range   Creatinine              84               mg/dL      >=16 ==================================================================== Declared Medications:  The flagging and interpretation on this report are based on the  following declared medications.  Unexpected results may arise from  inaccuracies in the declared medications.   **Note: The testing scope of this panel includes these medications:   Naproxen (Aleve)  Tramadol (Ultram)   **Note: The testing scope of this panel does not include the  following reported medications:   Amlodipine (Norvasc)  Furosemide (Lasix)  Lisinopril (Zestril)  Methimazole (Tapazole)  Multivitamin  Sennosides (Senokot)  Vitamin D3 ==================================================================== For clinical consultation, please call [phone]. ====================================================================       ROS  Constitutional: Denies any fever or chills Gastrointestinal: No reported hemesis, hematochezia, vomiting, or acute GI distress Musculoskeletal:  Right hip pain, improved from before Neurological: No reported  episodes of acute onset apraxia, aphasia, dysarthria, agnosia, amnesia, paralysis, loss of coordination, or loss of consciousness  Medication Review  Multiple Vitamins-Minerals, amLODipine, carboxymethylcellul-glycerin, cholecalciferol, furosemide, lisinopril, methimazole, multivitamin with minerals, naproxen sodium, sennosides-docusate sodium, and traMADol  History Review  Allergy: Ms. Ryall is allergic to tylenol [acetaminophen]. Drug: Ms. Fredman  reports no history of drug use. Alcohol:  reports current alcohol use of about 3.0 standard drinks of alcohol per week. Tobacco:  reports that she has never smoked. She has never used smokeless tobacco. Social: Ms. Winbush  reports that she has never smoked. She has never used smokeless tobacco. She reports current alcohol use of about 3.0 standard drinks of alcohol per week. She reports that she does not use drugs. 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 Surgical: Ms. Francom  has a past surgical history that includes Appendectomy; Acoustic neuroma resection (Right, 1985); Colon surgery; Shoulder arthroscopy w/ acromial repair (Right, 76yrs ago); Tubal ligation; Parathyroidectomy (2017); Ventriculoperitoneal shunt (Right, 09/08/2012); Brain surgery  (09/08/2012); Abdominal hysterectomy; Eye surgery (Right); Fracture surgery (Right); Excision/release bursa hip (Right, 10/29/2017); Cataract extraction (Bilateral); Breast excisional biopsy (Left); Breast excisional biopsy (Left); and Breast surgery (Left). Family: family history includes Breast cancer in her paternal aunt; Dementia in her mother; Diabetes in her sister; Heart disease in her brother and father; Hypertension in her father and mother; Mental illness in her mother; Stroke in her brother.  Laboratory Chemistry Profile   Renal Lab Results  Component Value Date   BUN 17 02/04/2023   CREATININE 1.16 (H) 02/04/2023   BCR 15 02/04/2023   GFRAA 60 01/23/2020   GFRNONAA 52 (L) 01/23/2020    Hepatic Lab Results  Component Value Date   AST 26 02/04/2023   ALT 13 02/04/2023   ALBUMIN 4.5 02/04/2023   ALKPHOS 74 02/04/2023   LIPASE 39 05/28/2017    Electrolytes Lab Results  Component Value Date   NA 144 02/04/2023   K 4.0 02/04/2023   CL 104 02/04/2023   CALCIUM 10.0 02/04/2023   PHOS 3.1 02/04/2023    Bone Lab Results  Component Value Date   VD25OH 47.0 02/04/2023    Inflammation (CRP: Acute Phase) (ESR: Chronic Phase) No results found for: "CRP", "ESRSEDRATE", "LATICACIDVEN"       Note: Above Lab results reviewed.    Physical Exam  General appearance: Well nourished, well developed, and well hydrated. In no apparent acute distress Mental status: Alert, oriented x 3 (person, place, & time)       Respiratory: No evidence of acute respiratory distress Eyes: PERLA Vitals: BP (!) 141/71 (BP Location: Left Arm, Patient Position: Sitting, Cuff Size: Normal)   Pulse 74   Temp (!) 96.8 F (36 C)   Ht 5\' 1"  (1.549 m)   Wt 113 lb (51.3 kg)   SpO2 100%   BMI 21.35 kg/m  BMI: Estimated body mass index is 21.35 kg/m as calculated from the following:   Height as of this encounter: 5\' 1"  (1.549 m).   Weight as of this encounter: 113 lb (51.3 kg). Ideal: Ideal body  weight: 47.8 kg (105 lb 6.1 oz) Adjusted ideal body weight: 49.2 kg (108 lb 6.8 oz)  Improvement in right hip pain  Assessment   Diagnosis Status  1. Lateral femoral cutaneous entrapment syndrome, right   2. Pain of right lateral upper thigh   3. SI joint arthritis (HCC)   4. Chronic pain syndrome    Improved Improved Controlled     Plan of Care   Excellent response to right lateral femoral cutaneous nerve block.  Patient states that she has significantly less radiating right leg pain.  Continue to monitor.  Repeat or consider pulsed radiofrequency ablation in future Pharmacotherapy (Medications Ordered): Meds ordered this encounter  Medications   traMADol (ULTRAM) 50 MG tablet    Sig: Take 1 tablet (50 mg total) by mouth every 6 (six) hours as needed for moderate pain (pain score 4-6).    Dispense:  60 tablet    Refill:  2   Orders:  Orders Placed  This Encounter  Procedures   FEMORAL NERVE BLOCK    Standing Status:   Standing    Number of Occurrences:   3    Next Expected Occurrence:   07/02/2023    Expiration Date:   07/11/2023    Scheduling Instructions:     Side: RIGHT     Sedation: without     Timeframe: PRN    Where will this procedure be performed?:   [facility]   Follow-up plan:   Return for PRN.      Bilateral SI joint and piriformis 09/23/2022, R [facility]N 9/18/      Recent Visits No visits were found meeting these conditions. Showing recent visits within past 90 days and meeting all other requirements Today's Visits Date Type Provider Dept  04/13/23 Office Visit Edward Jolly, MD [facility]  Showing today's visits and meeting all other requirements Future Appointments No visits were found meeting these conditions. Showing future appointments within next 90 days and meeting all other requirements  I discussed the assessment and treatment plan with the patient. The patient was provided an opportunity to ask questions and all were  answered. The patient agreed with the plan and demonstrated an understanding of the instructions.  Patient advised to call back or seek an in-person evaluation if the symptoms or condition worsens.  Duration of encounter: 30 minutes.  Total time on encounter, as per AMA guidelines included both the face-to-face and non-face-to-face time personally spent by the physician and/or other qualified health care professional(s) on the day of the encounter (includes time in activities that require the physician or other qualified health care professional and does not include time in activities normally performed by clinical staff). Physician's time may include the following activities when performed: Preparing to see the patient (e.g., pre-charting review of records, searching for previously ordered imaging, lab work, and nerve conduction tests) Review of prior analgesic pharmacotherapies. Reviewing PMP Interpreting ordered tests (e.g., lab work, imaging, nerve conduction tests) Performing post-procedure evaluations, including interpretation of diagnostic procedures Obtaining and/or reviewing separately obtained history Performing a medically appropriate examination and/or evaluation Counseling and educating the patient/family/caregiver Ordering medications, tests, or procedures Referring and communicating with other health care professionals (when not separately reported) Documenting clinical information in the electronic or other health record Independently interpreting results (not separately reported) and communicating results to the patient/ family/caregiver Care coordination (not separately reported)  Note by: Edward Jolly, MD Date: 04/13/2023; Time: 11:31 AM

## 2023-04-14 DIAGNOSIS — Z961 Presence of intraocular lens: Secondary | ICD-10-CM | POA: Diagnosis not present

## 2023-04-14 DIAGNOSIS — H0288A Meibomian gland dysfunction right eye, upper and lower eyelids: Secondary | ICD-10-CM | POA: Diagnosis not present

## 2023-04-14 DIAGNOSIS — H353132 Nonexudative age-related macular degeneration, bilateral, intermediate dry stage: Secondary | ICD-10-CM | POA: Diagnosis not present

## 2023-04-14 DIAGNOSIS — H0288B Meibomian gland dysfunction left eye, upper and lower eyelids: Secondary | ICD-10-CM | POA: Diagnosis not present

## 2023-04-16 ENCOUNTER — Other Ambulatory Visit: Payer: Self-pay | Admitting: Student in an Organized Health Care Education/Training Program

## 2023-04-16 DIAGNOSIS — M79651 Pain in right thigh: Secondary | ICD-10-CM

## 2023-04-16 DIAGNOSIS — G894 Chronic pain syndrome: Secondary | ICD-10-CM

## 2023-04-16 DIAGNOSIS — G5711 Meralgia paresthetica, right lower limb: Secondary | ICD-10-CM

## 2023-04-16 DIAGNOSIS — M461 Sacroiliitis, not elsewhere classified: Secondary | ICD-10-CM

## 2023-04-26 DIAGNOSIS — E042 Nontoxic multinodular goiter: Secondary | ICD-10-CM | POA: Diagnosis not present

## 2023-04-26 DIAGNOSIS — K118 Other diseases of salivary glands: Secondary | ICD-10-CM | POA: Diagnosis not present

## 2023-04-28 DIAGNOSIS — M5441 Lumbago with sciatica, right side: Secondary | ICD-10-CM | POA: Diagnosis not present

## 2023-04-28 DIAGNOSIS — M9903 Segmental and somatic dysfunction of lumbar region: Secondary | ICD-10-CM | POA: Diagnosis not present

## 2023-04-28 DIAGNOSIS — M955 Acquired deformity of pelvis: Secondary | ICD-10-CM | POA: Diagnosis not present

## 2023-04-28 DIAGNOSIS — M9905 Segmental and somatic dysfunction of pelvic region: Secondary | ICD-10-CM | POA: Diagnosis not present

## 2023-05-19 DIAGNOSIS — E559 Vitamin D deficiency, unspecified: Secondary | ICD-10-CM | POA: Diagnosis not present

## 2023-05-19 DIAGNOSIS — E0590 Thyrotoxicosis, unspecified without thyrotoxic crisis or storm: Secondary | ICD-10-CM | POA: Diagnosis not present

## 2023-05-19 DIAGNOSIS — M810 Age-related osteoporosis without current pathological fracture: Secondary | ICD-10-CM | POA: Diagnosis not present

## 2023-05-19 DIAGNOSIS — E042 Nontoxic multinodular goiter: Secondary | ICD-10-CM | POA: Diagnosis not present

## 2023-05-26 ENCOUNTER — Ambulatory Visit

## 2023-05-26 DIAGNOSIS — M9905 Segmental and somatic dysfunction of pelvic region: Secondary | ICD-10-CM | POA: Diagnosis not present

## 2023-05-26 DIAGNOSIS — Z0000 Encounter for general adult medical examination without abnormal findings: Secondary | ICD-10-CM

## 2023-05-26 DIAGNOSIS — M5441 Lumbago with sciatica, right side: Secondary | ICD-10-CM | POA: Diagnosis not present

## 2023-05-26 DIAGNOSIS — M9903 Segmental and somatic dysfunction of lumbar region: Secondary | ICD-10-CM | POA: Diagnosis not present

## 2023-05-26 DIAGNOSIS — M955 Acquired deformity of pelvis: Secondary | ICD-10-CM | POA: Diagnosis not present

## 2023-05-26 NOTE — Progress Notes (Signed)
 Subjective:   Danielle Herrera is a 89 y.o. who presents for a Medicare Wellness preventive visit.  Visit Complete: Virtual I connected with  Danielle Herrera on 05/26/23 by a audio enabled telemedicine application and verified that I am speaking with the correct person using two identifiers.  Patient Location: Home  Provider Location: Office/Clinic  I discussed the limitations of evaluation and management by telemedicine. The patient expressed understanding and agreed to proceed.  Vital Signs: Because this visit was a virtual/telehealth visit, some criteria may be missing or patient reported. Any vitals not documented were not able to be obtained and vitals that have been documented are patient reported.  VideoDeclined- This patient declined Librarian, academic. Therefore the visit was completed with audio only.  Persons Participating in Visit: Patient.  AWV Questionnaire: No: Patient Medicare AWV questionnaire was not completed prior to this visit.  Cardiac Risk Factors include: advanced age (>62men, >80 women);dyslipidemia;hypertension     Objective:    There were no vitals filed for this visit. There is no height or weight on file to calculate BMI.     05/26/2023    8:57 AM 04/13/2023   11:12 AM 12/03/2022   11:31 AM 11/18/2022   11:21 AM 10/20/2022   11:06 AM 10/05/2022    2:07 PM 09/23/2022    1:19 PM  Advanced Directives  Does Patient Have a Medical Advance Directive? Yes Yes Yes Yes Yes Yes Yes  Type of Estate agent of [place];Living will Healthcare Power of [place];Living will       Does patient want to make changes to medical advance directive? No - Patient declined        Copy of Healthcare Power of Attorney in Chart? Yes - validated most recent copy scanned in chart (See row information)          Current Medications (verified) Outpatient Encounter Medications as of 05/26/2023  Medication Sig   amLODipine  (NORVASC) 10 MG tablet TAKE 1 TABLET BY MOUTH EVERY DAY   carboxymethylcellul-glycerin (OPTIVE) 0.5-0.9 % ophthalmic solution Place 1 drop into both eyes 2 (two) times daily.   cholecalciferol (VITAMIN D3) 25 MCG (1000 UNIT) tablet Take 1,000 Units by mouth daily.   furosemide (LASIX) 20 MG tablet Take 1 tablet (20 mg total) by mouth daily.   lisinopril (ZESTRIL) 10 MG tablet Take 1 tablet (10 mg total) by mouth daily.   methimazole (TAPAZOLE) 5 MG tablet Take 5 mg by mouth daily. Dr. Alben Spittle- [facility].   Multiple Vitamin (MULTIVITAMIN WITH MINERALS) TABS tablet Take 1 tablet by mouth daily. Centrum Silver for Women 50+   Multiple Vitamins-Minerals (PRESERVISION AREDS 2 PO) Take 1 each by mouth 2 (two) times daily.   naproxen sodium (ALEVE) 220 MG tablet Take 4 tablets by mouth daily.   sennosides-docusate sodium (SENOKOT-S) 8.6-50 MG tablet Take 2 tablets by mouth 2 (two) times daily.    traMADol (ULTRAM) 50 MG tablet Take 1 tablet (50 mg total) by mouth every 6 (six) hours as needed for moderate pain (pain score 4-6).   No facility-administered encounter medications on file as of 05/26/2023.    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 Hx of   Allergy    Arthritis of knee, left 01/20/2010   Bone spur 2019   right hip    Cataract 12/06/2019  surgery    Esophageal reflux 08/13/2014   Fibrocystic breast disease    Goiter, nontoxic, multinodular 05/23/2012   H/O jaundice    Hakim's syndrome (HCC) 11/05/2012   Hepatitis    as a child from drinking bad water   Hip pain, chronic, right    HLD (hyperlipidemia) 08/13/2014   Hyperparathyroidism, primary (HCC) 08/02/2013   Hypertension    Hyperthyroidism    Multiple thyroid nodules    Normal pressure hydrocephalus (HCC) 11/30/2013   Osteopenia 01/20/2010   Osteoporosis    Pneumonia  1960's   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 Ladoris Gene)   BREAST EXCISIONAL BIOPSY Left    benign   BREAST EXCISIONAL BIOPSY Left    benign   BREAST SURGERY Left    lumpectomy   CATARACT EXTRACTION Bilateral    right eye several years ago; left eye fall 2021   COLON SURGERY     EXCISION/RELEASE BURSA HIP Right 10/29/2017   Procedure: EXCISION/RELEASE BURSA HIP- IT BAND;  Surgeon: Signa Kell, MD;  Location: [facility] ORS;  Service: Orthopedics;  Laterality: Right;   EYE SURGERY Right    cataract   FRACTURE SURGERY Right    shoulder   PARATHYROIDECTOMY  2017   partial for hyperparathyroidism   SHOULDER ARTHROSCOPY W/ ACROMIAL REPAIR Right 67yrs ago   TUBAL LIGATION     VENTRICULOPERITONEAL SHUNT Right 09/08/2012   for NPH   Family History  Problem Relation Age of Onset   Hypertension Mother    Mental illness Mother    Dementia Mother    Heart disease Father    Hypertension Father    Diabetes Sister    Stroke Brother    Heart disease Brother    Breast cancer Paternal Aunt    Social History   Socioeconomic History   Marital status: Widowed    Spouse name: Not on file   Number of children: 0   Years of education: Not on file   Highest education level: 12th grade  Occupational History   Occupation: retired  Tobacco Use   Smoking status: Never   Smokeless tobacco: Never  Vaping Use   Vaping status: Never Used  Substance and Sexual Activity   Alcohol use: Yes    Alcohol/week: 3.0 standard drinks of alcohol    Types: 3 Glasses of wine per week    Comment: Occasionally   Drug use: Never   Sexual activity: Not  Currently  Other Topics Concern   Not on file  Social History Narrative   Pt lives alone   Social Drivers of Health   Financial Resource Strain: Low Risk  (05/26/2023)   Overall Financial Resource Strain (CARDIA)    Difficulty of Paying Living Expenses: Not hard at all  Food Insecurity: No Food Insecurity (05/26/2023)   Hunger Vital Sign    Worried About Running Out of Food in the Last Year: Never true    Ran Out of Food in the Last Year: Never true  Transportation Needs: No Transportation Needs (05/26/2023)   PRAPARE - Administrator, Civil Service (Medical): No    Lack of Transportation (Non-Medical): No  Physical Activity: Insufficiently Active (05/26/2023)   Exercise Vital Sign    Days of Exercise per Week: 7 days    Minutes of Exercise per Session: 10 min  Stress: No Stress Concern Present (05/25/2023)   [employer] of Occupational Health - Occupational Stress Questionnaire    Feeling of Stress : Only a little  Social Connections: Socially Isolated (05/26/2023)   Social Connection and Isolation Panel [NHANES]    Frequency of Communication with Friends and Family: More than three times a week    Frequency of Social Gatherings with Friends and Family: Twice a week    Attends Religious Services: Never    Database administrator or Organizations: No    Attends Banker Meetings: Never    Marital Status: Widowed    Tobacco Counseling Counseling given: Not Answered    Clinical Intake:  Pre-visit preparation completed: Yes  Pain : No/denies pain     BMI - recorded: 21.4 Nutritional Status: BMI of 19-24  Normal Nutritional Risks: None Diabetes: No  No results found for: "HGBA1C"   How often do you need to have someone help you when you read instructions, pamphlets, or other written materials from your doctor or pharmacy?: 1 - Never  Interpreter Needed?: No  Information entered by :: Kennedy Bucker, LPN   Activities of Daily Living      05/26/2023    8:59 AM 05/25/2023   10:18 AM  In your present state of health, do you have any difficulty performing the following activities:  Hearing? 1 1  Vision? 0 0  Difficulty concentrating or making decisions? 1 1  Walking or climbing stairs? 0 0  Dressing or bathing? 0 0  Doing errands, shopping? 0 0  Preparing Food and eating ? N N  Using the Toilet? N N  In the past six months, have you accidently leaked urine? N N  Do you have problems with loss of bowel control? N N  Managing your Medications? N N  Managing your Finances? N N  Housekeeping or managing your Housekeeping? N N    Patient Care Team: Reubin Milan, MD as PCP - General (Internal Medicine) Zoila Shutter, MD as Referring Physician (Endocrinology) Jesusita Oka, MD (Dermatology) Cristela Blue, DC as Referring Physician (Chiropractic Medicine) Mateo Flow, MD as Consulting Physician (Ophthalmology) Midge Minium, MD as Consulting Physician (Gastroenterology) Edward Jolly, MD as Consulting Physician (Pain Medicine) [facility], [facility]  Indicate any recent Medical Services you may have received from other than [facility] providers in the past year (date may be approximate).     Assessment:   This is a routine wellness examination for Danielle Herrera.  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 1 1 0 1 1  Number falls in past yr: 1 0 0 0 0  Injury with Fall? 0 0 0 0 0  Risk for fall due to :   History of fall(s)  History of fall(s)  Follow up Falls prevention discussed;Falls evaluation completed  Falls evaluation completed  Falls evaluation completed    MEDICARE RISK AT HOME:  Medicare Risk at Home Any stairs in or around the home?: No If so, are there any without handrails?: No Home free of loose throw rugs in walkways, pet beds, electrical cords, etc?: Yes Adequate lighting in your home to reduce risk of falls?: Yes Life alert?: Yes Use of a cane, walker or w/c?: Yes (WALKER ALL THE TIME) Grab bars in the bathroom?: Yes Shower chair or bench in shower?: Yes Elevated toilet seat or a handicapped toilet?: No  TIMED UP AND GO:  Was the test performed?  No  Cognitive Function: 6CIT completed        05/26/2023    9:00 AM 05/20/2022   10:08 AM 02/02/2022   10:00 AM 05/10/2019   10:13 AM 05/04/2018    1:45 PM  6CIT Screen  What Year? 0 points 0 points 0 points 0 points 0 points  What month? 0 points 0 points 0 points 0 points 0 points  What time? 0 points 0 points 0 points 0 points 0 points  Count back from 20 0 points 0 points 0 points 0 points 0 points  Months in reverse 0 points 0 points 0 points 0 points 0 points  Repeat phrase 0 points 0 points 0 points 0 points 0 points  Total Score 0 points 0 points 0 points 0 points 0 points    Immunizations Immunization History  Administered Date(s) Administered   Fluad Quad(high Dose 65+) 11/08/2018   Influenza, High Dose Seasonal PF 11/22/2017, 11/07/2019, 11/25/2021, 12/04/2022   Influenza,inj,Quad PF,6+ Mos 12/23/2020   Influenza-Unspecified 11/26/2011, 11/30/2016, 11/21/2018, 12/23/2020   PFIZER Comirnaty(Gray Top)Covid-19 Tri-Sucrose Vaccine 03/15/2019, 04/06/2019, 11/27/2019, 06/06/2020   PFIZER(Purple Top)SARS-COV-2 Vaccination 11/25/2021   PNEUMOCOCCAL CONJUGATE-20 02/02/2022   Pfizer  Covid-19 Vaccine Bivalent Booster 55yrs & up 12/30/2020   Pfizer(Comirnaty)Fall Seasonal Vaccine 12 years and older 12/19/2022   Pneumococcal Conjugate-13 04/24/2014, 03/02/2016   Pneumococcal Polysaccharide-23 05/31/2010, 07/01/2012, 11/21/2015   Pneumococcal-Unspecified 10/31/2012   Rsv, Bivalent, Protein Subunit Rsvpref,pf Verdis Frederickson) 06/22/2022   Tdap 08/13/2014   Unspecified SARS-COV-2 Vaccination 11/25/2021   Zoster Recombinant(Shingrix) 07/03/2016, 08/30/2016   Zoster, Live 03/02/2010    Screening Tests Health Maintenance  Topic Date Due   COVID-19 Vaccine (8 - Pfizer risk 2024-25 season) 06/19/2023   MAMMOGRAM  02/22/2024   Medicare Annual Wellness (AWV)  05/25/2024   DTaP/Tdap/Td (2 - Td or Tdap) 08/12/2024   Pneumonia Vaccine 43+ Years old  Completed   INFLUENZA VACCINE  Completed   DEXA SCAN  Completed   Zoster Vaccines- Shingrix  Completed   HPV VACCINES  Aged Out    Health Maintenance  There are no preventive care reminders to display for this patient. Health Maintenance Items Addressed: MAMMOGRAM UP TO DATE, SHOTS UP TO DATE  Additional Screening:  Vision Screening: Recommended annual ophthalmology exams for early detection of glaucoma and other disorders of the eye.  Dental Screening: Recommended annual dental exams  for proper oral hygiene  Community Resource Referral / Chronic Care Management: CRR required this visit?  No   CCM required this visit?  No     Plan:     I have personally reviewed and noted the following in the patient's chart:   Medical and social history Use of alcohol, tobacco or illicit drugs  Current medications and supplements including opioid prescriptions. Patient is not currently taking opioid prescriptions. Functional ability and status Nutritional status Physical activity Advanced directives List of other physicians Hospitalizations, surgeries, and ER visits in previous 12 months Vitals Screenings to include cognitive,  depression, and falls Referrals and appointments  In addition, I have reviewed and discussed with patient certain preventive protocols, quality metrics, and best practice recommendations. A written personalized care plan for preventive services as well as general preventive health recommendations were provided to patient.     Hal Hope, LPN   9/56/2130   After Visit Summary: (MyChart) Due to this being a telephonic visit, the after visit summary with patients personalized plan was offered to patient via MyChart   Notes: Nothing significant to report at this time.

## 2023-05-26 NOTE — Patient Instructions (Addendum)
 Danielle Herrera , Thank you for taking time to come for your Medicare Wellness Visit. I appreciate your ongoing commitment to your health goals. Please review the following plan we discussed and let me know if I can assist you in the future.   Referrals/Orders/Follow-Ups/Clinician Recommendations: NONE  This is a list of the screening recommended for you and due dates:  Health Maintenance  Topic Date Due   COVID-19 Vaccine (8 - Pfizer risk 2024-25 season) 06/19/2023   Mammogram  02/22/2024   Medicare Annual Wellness Visit  05/25/2024   DTaP/Tdap/Td vaccine (2 - Td or Tdap) 08/12/2024   Pneumonia Vaccine  Completed   Flu Shot  Completed   DEXA scan (bone density measurement)  Completed   Zoster (Shingles) Vaccine  Completed   HPV Vaccine  Aged Out    Advanced directives: (ACP Link)Information on Advanced Care Planning can be found at [facility] of [employer] Advance Health Care Directives Advance Health Care Directives. [email or web address]   Next Medicare Annual Wellness Visit scheduled for next year: Yes  05/31/24 @ 8:50 AM BY PHONE

## 2023-06-10 ENCOUNTER — Other Ambulatory Visit: Payer: Self-pay | Admitting: Internal Medicine

## 2023-06-10 DIAGNOSIS — I10 Essential (primary) hypertension: Secondary | ICD-10-CM

## 2023-06-10 NOTE — Telephone Encounter (Signed)
 Requested Prescriptions  Pending Prescriptions Disp Refills   lisinopril (ZESTRIL) 10 MG tablet [Pharmacy Med Name: LISINOPRIL 10MG  TABLETS] 90 tablet 0    Sig: TAKE 1 TABLET BY MOUTH ONCE DAILY     Cardiovascular:  ACE Inhibitors Failed - 06/10/2023  4:03 PM      Failed - Cr in normal range and within 180 days    Creatinine  Date Value Ref Range Status  07/23/2012 0.55 (L) 0.60 - 1.30 mg/dL Final   Creatinine, Ser  Date Value Ref Range Status  02/04/2023 1.16 (H) 0.57 - 1.00 mg/dL Final         Failed - Last BP in normal range    BP Readings from Last 1 Encounters:  04/13/23 (!) 141/71         Passed - K in normal range and within 180 days    Potassium  Date Value Ref Range Status  02/04/2023 4.0 3.5 - 5.2 mmol/L Final  07/23/2012 3.4 (L) 3.5 - 5.1 mmol/L Final         Passed - Patient is not pregnant      Passed - Valid encounter within last 6 months    Recent Outpatient Visits   None     Future Appointments             In 1 month Judithann Graves, Nyoka Cowden, MD [facility] at [facility], [facility]

## 2023-06-16 DIAGNOSIS — M9905 Segmental and somatic dysfunction of pelvic region: Secondary | ICD-10-CM | POA: Diagnosis not present

## 2023-06-16 DIAGNOSIS — M9903 Segmental and somatic dysfunction of lumbar region: Secondary | ICD-10-CM | POA: Diagnosis not present

## 2023-06-16 DIAGNOSIS — M955 Acquired deformity of pelvis: Secondary | ICD-10-CM | POA: Diagnosis not present

## 2023-06-16 DIAGNOSIS — M5441 Lumbago with sciatica, right side: Secondary | ICD-10-CM | POA: Diagnosis not present

## 2023-07-04 ENCOUNTER — Other Ambulatory Visit: Payer: Self-pay | Admitting: Internal Medicine

## 2023-07-06 NOTE — Telephone Encounter (Signed)
 Requested Prescriptions  Refused Prescriptions Disp Refills   amLODipine  (NORVASC ) 10 MG tablet [Pharmacy Med Name: AMLODIPINE  BESYLATE 10MG  TABLETS] 90 tablet 3    Sig: TAKE 1 TABLET BY MOUTH ONCE DAILY     Cardiovascular: Calcium  Channel Blockers 2 Failed - 07/06/2023 12:28 PM      Failed - Last BP in normal range    BP Readings from Last 1 Encounters:  04/13/23 (!) 141/71         Passed - Last Heart Rate in normal range    Pulse Readings from Last 1 Encounters:  04/13/23 74         Passed - Valid encounter within last 6 months    Recent Outpatient Visits   None     Future Appointments             In 2 weeks Gala Jubilee, Chales [place], MD [facility] at [facility], [facility]

## 2023-07-14 DIAGNOSIS — M5441 Lumbago with sciatica, right side: Secondary | ICD-10-CM | POA: Diagnosis not present

## 2023-07-14 DIAGNOSIS — M9905 Segmental and somatic dysfunction of pelvic region: Secondary | ICD-10-CM | POA: Diagnosis not present

## 2023-07-14 DIAGNOSIS — M955 Acquired deformity of pelvis: Secondary | ICD-10-CM | POA: Diagnosis not present

## 2023-07-14 DIAGNOSIS — M9903 Segmental and somatic dysfunction of lumbar region: Secondary | ICD-10-CM | POA: Diagnosis not present

## 2023-07-21 ENCOUNTER — Other Ambulatory Visit: Payer: Self-pay | Admitting: Internal Medicine

## 2023-07-21 ENCOUNTER — Encounter: Payer: Self-pay | Admitting: Internal Medicine

## 2023-07-21 ENCOUNTER — Ambulatory Visit (INDEPENDENT_AMBULATORY_CARE_PROVIDER_SITE_OTHER): Payer: Self-pay | Admitting: Internal Medicine

## 2023-07-21 VITALS — BP 124/78 | HR 78 | Ht 61.0 in | Wt 112.3 lb

## 2023-07-21 DIAGNOSIS — I10 Essential (primary) hypertension: Secondary | ICD-10-CM | POA: Diagnosis not present

## 2023-07-21 DIAGNOSIS — E0590 Thyrotoxicosis, unspecified without thyrotoxic crisis or storm: Secondary | ICD-10-CM

## 2023-07-21 DIAGNOSIS — N1831 Chronic kidney disease, stage 3a: Secondary | ICD-10-CM

## 2023-07-21 DIAGNOSIS — R634 Abnormal weight loss: Secondary | ICD-10-CM | POA: Diagnosis not present

## 2023-07-21 HISTORY — DX: Abnormal weight loss: R63.4

## 2023-07-21 MED ORDER — AMLODIPINE BESYLATE 10 MG PO TABS: 10.0000 mg | ORAL_TABLET | Freq: Every day | ORAL | 3 refills | Status: AC

## 2023-07-21 NOTE — Assessment & Plan Note (Signed)
 Will continue to monitor labs. Avoid NSAIDS - okay to use Tramadol  for pain

## 2023-07-21 NOTE — Assessment & Plan Note (Signed)
 On tapazole  5 mg per day. Gradual weight loss of 10 lbs over the past year.  May need to consider lower dose of tapazole . Will recheck TSH and T4. She requests referral to Endo in [place].

## 2023-07-21 NOTE — Assessment & Plan Note (Signed)
 Blood pressure is well controlled.  Current medications are amlodipine , lisinopril  and lasix  Will continue same regimen along with efforts to limit dietary sodium.

## 2023-07-21 NOTE — Assessment & Plan Note (Signed)
 She has lost 10 lbs over the past year since walking daily. Recommend continuing to monitor - no worrisome complaints or symptoms Continue Boost daily along with regular meals Consider adjusting thyroid  medications with Endo

## 2023-07-21 NOTE — Progress Notes (Signed)
 Date:  07/21/2023   Name:  Danielle Herrera   DOB:  Dec 05, 1933   MRN:  [id number]   Chief Complaint: Hypertension  Hypertension This is a chronic problem. The problem is controlled. Pertinent negatives include no chest pain, headaches, palpitations or shortness of breath. Past treatments include calcium  channel blockers, ACE inhibitors and diuretics. The current treatment provides significant improvement. Identifiable causes of hypertension include a thyroid  problem.  Thyroid  Problem Presents for follow-up visit. Symptoms include weight loss (gradual loss of about 6 lbs since September). Patient reports no anxiety, constipation, fatigue, hoarse voice, palpitations or tremors. The symptoms have been stable.  CKD - borderline line readings- fairly stable over the past few years.  She is not taking nsaids, uses Tramadol  for pain.   Review of Systems  Constitutional:  Positive for unexpected weight change (gradual loss since she started walking her dog every day about 1/2 mile total.) and weight loss (gradual loss of about 6 lbs since September). Negative for chills, fatigue and fever.  HENT:  Negative for hoarse voice.   Respiratory:  Negative for chest tightness and shortness of breath.   Cardiovascular:  Negative for chest pain and palpitations.  Gastrointestinal:  Negative for constipation.  Neurological:  Negative for dizziness, tremors and headaches.  Psychiatric/Behavioral:  Negative for dysphoric mood and sleep disturbance. The patient is not nervous/anxious.      Lab Results  Component Value Date   NA 144 02/04/2023   K 4.0 02/04/2023   CO2 25 02/04/2023   GLUCOSE 87 02/04/2023   BUN 17 02/04/2023   CREATININE 1.16 (H) 02/04/2023   CALCIUM  10.0 02/04/2023   EGFR 46 (L) 02/04/2023   GFRNONAA 52 (L) 01/23/2020   Lab Results  Component Value Date   CHOL 267 (H) 02/04/2023   HDL 98 02/04/2023   LDLCALC 154 (H) 02/04/2023   TRIG 91 02/04/2023   CHOLHDL 2.7 02/04/2023    Lab Results  Component Value Date   TSH 0.584 02/04/2023   No results found for: "HGBA1C" Lab Results  Component Value Date   WBC 7.0 02/04/2023   HGB 13.4 02/04/2023   HCT 41.1 02/04/2023   MCV 89 02/04/2023   PLT 297 02/04/2023   Lab Results  Component Value Date   ALT 13 02/04/2023   AST 26 02/04/2023   ALKPHOS 74 02/04/2023   BILITOT 0.3 02/04/2023   Lab Results  Component Value Date   VD25OH 47.0 02/04/2023     Patient Active Problem List   Diagnosis Date Noted   Unintended weight loss 07/21/2023   Lateral femoral cutaneous entrapment syndrome, right 11/27/2022   Senile ecchymosis 11/27/2022   SI (sacroiliac) joint dysfunction 09/15/2022   Bilateral primary osteoarthritis of hip 09/15/2022   Piriformis syndrome of both sides 09/15/2022   Chronic pain syndrome 09/15/2022   CKD stage 3a, GFR 45-59 ml/min (HCC) 08/04/2022   Enthesopathy of hip region on both sides 08/04/2022   Dysphagia 06/09/2022   Slow transit constipation 01/23/2020   Benign essential tremor 03/10/2018   Insomnia 07/12/2017   Trochanteric bursitis of right hip 01/12/2017   Pseudophakia of right eye 12/12/2015   History of parathyroidectomy 07/10/2015   Hyperthyroidism, subclinical 04/22/2015   Hyperlipidemia, mixed 08/13/2014   Vitamin D  deficiency 08/13/2014   Ventricular shunt in place 08/13/2014   Esophageal reflux 08/13/2014   Abnormal gait 08/26/2012   Goiter, nontoxic, multinodular 05/23/2012   History of acoustic neuroma 01/20/2010   Colon polyp 01/20/2010   Dependent edema  01/20/2010   Essential (primary) hypertension 01/20/2010   Arthritis of knee, left 01/20/2010   Osteopenia 01/20/2010    Allergies  Allergen Reactions   Tylenol  [Acetaminophen ] Other (See Comments)    Elevated LFTs    Past Surgical History:  Procedure Laterality Date   ABDOMINAL HYSTERECTOMY     ACOUSTIC NEUROMA RESECTION Right 1985   APPENDECTOMY     BRAIN SURGERY  09/08/2012   burr hole with  biopsy; cranial tongs caliper/stereotactic frame right (Dr. Marica Shoals)   BREAST EXCISIONAL BIOPSY Left    benign   BREAST EXCISIONAL BIOPSY Left    benign   BREAST SURGERY Left    lumpectomy   CATARACT EXTRACTION Bilateral    right eye several years ago; left eye fall 2021   COLON SURGERY     EXCISION/RELEASE BURSA HIP Right 10/29/2017   Procedure: EXCISION/RELEASE BURSA HIP- IT BAND;  Surgeon: Lorri Rota, MD;  Location: [facility] ORS;  Service: Orthopedics;  Laterality: Right;   EYE SURGERY Right    cataract   FRACTURE SURGERY Right    shoulder   PARATHYROIDECTOMY  2017   partial for hyperparathyroidism   SHOULDER ARTHROSCOPY W/ ACROMIAL REPAIR Right 68yrs ago   TUBAL LIGATION     VENTRICULOPERITONEAL SHUNT Right 09/08/2012   for NPH    Social History   Tobacco Use   Smoking status: Never   Smokeless tobacco: Never  Vaping Use   Vaping status: Never Used  Substance Use Topics   Alcohol use: Yes    Alcohol/week: 3.0 standard drinks of alcohol    Types: 3 Glasses of wine per week    Comment: Occasionally   Drug use: Never     Medication list has been reviewed and updated.  Current Meds  Medication Sig   alendronate (FOSAMAX) 70 MG tablet Take 70 mg by mouth once a week.   carboxymethylcellul-glycerin (OPTIVE) 0.5-0.9 % ophthalmic solution Place 1 drop into both eyes 2 (two) times daily.   cholecalciferol (VITAMIN D3) 25 MCG (1000 UNIT) tablet Take 1,000 Units by mouth daily.   furosemide  (LASIX ) 20 MG tablet Take 1 tablet (20 mg total) by mouth daily.   lisinopril  (ZESTRIL ) 10 MG tablet TAKE 1 TABLET BY MOUTH ONCE DAILY   methimazole  (TAPAZOLE ) 5 MG tablet Take 5 mg by mouth daily. Dr. Reyne Cave- [facility].   Multiple Vitamin (MULTIVITAMIN WITH MINERALS) TABS tablet Take 1 tablet by mouth daily. Centrum Silver for Women 50+   Multiple Vitamins-Minerals (PRESERVISION AREDS 2 PO) Take 1 each by mouth 2 (two) times daily.   naproxen sodium (ALEVE) 220 MG tablet Take  4 tablets by mouth daily.   sennosides-docusate sodium (SENOKOT-S) 8.6-50 MG tablet Take 2 tablets by mouth 2 (two) times daily.    traMADol  (ULTRAM ) 50 MG tablet Take 1 tablet (50 mg total) by mouth every 6 (six) hours as needed for moderate pain (pain score 4-6).   [DISCONTINUED] amLODipine  (NORVASC ) 10 MG tablet TAKE 1 TABLET BY MOUTH EVERY DAY       07/21/2023   10:42 AM 02/04/2023    9:14 AM 11/27/2022    1:30 PM 09/17/2022    4:24 PM  GAD 7 : Generalized Anxiety Score  Nervous, Anxious, on Edge 0 0 0 0  Control/stop worrying 0 0 0 0  Worry too much - different things 0 0 0 0  Trouble relaxing 0 0 0 0  Restless 0 0 0 0  Easily annoyed or irritable 0 0 0 0  Afraid - awful might happen 0 0 0 0  Total GAD 7 Score 0 0 0 0  Anxiety Difficulty Not difficult at all Not difficult at all Not difficult at all Not difficult at all       07/21/2023   10:42 AM 05/26/2023    8:55 AM 02/04/2023    9:14 AM  Depression screen PHQ 2/9  Decreased Interest 0 0 0  Down, Depressed, Hopeless 0 0 0  PHQ - 2 Score 0 0 0  Altered sleeping 0 0 0  Tired, decreased energy 0 0 0  Change in appetite 0 0 0  Feeling bad or failure about yourself  0 0 0  Trouble concentrating 0 0 0  Moving slowly or fidgety/restless 0 0 0  Suicidal thoughts 0 0 0  PHQ-9 Score 0 0 0  Difficult doing work/chores Not difficult at all Not difficult at all Not difficult at all    BP Readings from Last 3 Encounters:  07/21/23 124/78  04/13/23 (!) 141/71  02/04/23 122/76    Physical Exam Constitutional:      Appearance: Normal appearance.  Neck:     Vascular: No carotid bruit.  Cardiovascular:     Rate and Rhythm: Normal rate and regular rhythm.     Heart sounds: No murmur heard. Pulmonary:     Effort: Pulmonary effort is normal.  Musculoskeletal:        General: Swelling (chronic) present.     Cervical back: Normal range of motion. No tenderness.     Right lower leg: No edema.     Left lower leg: No edema.   Lymphadenopathy:     Cervical: No cervical adenopathy.  Skin:    General: Skin is warm and dry.  Neurological:     General: No focal deficit present.     Mental Status: She is alert.     Gait: Gait normal.  Psychiatric:        Mood and Affect: Mood normal.        Behavior: Behavior normal.     Wt Readings from Last 3 Encounters:  07/21/23 112 lb 5 oz (50.9 kg)  04/13/23 113 lb (51.3 kg)  02/04/23 115 lb (52.2 kg)    BP 124/78   Pulse 78   Ht 5\' 1"  (1.549 m)   Wt 112 lb 5 oz (50.9 kg)   SpO2 94%   BMI 21.22 kg/m   Assessment and Plan:  Problem List Items Addressed This Visit       Unprioritized   Essential (primary) hypertension - Primary (Chronic)   Blood pressure is well controlled.  Current medications are amlodipine , lisinopril  and lasix  Will continue same regimen along with efforts to limit dietary sodium.       Relevant Medications   amLODipine  (NORVASC ) 10 MG tablet   Hyperthyroidism, subclinical (Chronic)   On tapazole  5 mg per day. Gradual weight loss of 10 lbs over the past year.  May need to consider lower dose of tapazole . Will recheck TSH and T4. She requests referral to Endo in [place].      Relevant Orders   Ambulatory referral to Endocrinology   TSH + free T4   CKD stage 3a, GFR 45-59 ml/min (HCC) (Chronic)   Will continue to monitor labs. Avoid NSAIDS - okay to use Tramadol  for pain      Relevant Orders   Comprehensive metabolic panel with GFR   Unintended weight loss   She has lost 10 lbs over the  past year since walking daily. Recommend continuing to monitor - no worrisome complaints or symptoms Continue Boost daily along with regular meals Consider adjusting thyroid  medications with Endo       Return in about 6 months (around 01/21/2024) for CPX.    Sheron Dixons, MD [facility] [place]

## 2023-07-22 ENCOUNTER — Ambulatory Visit: Payer: Self-pay | Admitting: Internal Medicine

## 2023-07-22 LAB — COMPREHENSIVE METABOLIC PANEL WITH GFR
ALT: 16 IU/L (ref 0–32)
AST: 23 IU/L (ref 0–40)
Albumin: 4.4 g/dL (ref 3.7–4.7)
Alkaline Phosphatase: 93 IU/L (ref 44–121)
BUN/Creatinine Ratio: 13 (ref 12–28)
BUN: 15 mg/dL (ref 8–27)
Bilirubin Total: 0.3 mg/dL (ref 0.0–1.2)
CO2: 21 mmol/L (ref 20–29)
Calcium: 10.1 mg/dL (ref 8.7–10.3)
Chloride: 104 mmol/L (ref 96–106)
Creatinine, Ser: 1.12 mg/dL — ABNORMAL HIGH (ref 0.57–1.00)
Globulin, Total: 2.6 g/dL (ref 1.5–4.5)
Glucose: 106 mg/dL — ABNORMAL HIGH (ref 70–99)
Potassium: 5 mmol/L (ref 3.5–5.2)
Sodium: 143 mmol/L (ref 134–144)
Total Protein: 7 g/dL (ref 6.0–8.5)
eGFR: 48 mL/min/{1.73_m2} — ABNORMAL LOW (ref >59)

## 2023-07-22 LAB — TSH+FREE T4
Free T4: 1.22 ng/dL (ref 0.82–1.77)
TSH: 0.596 u[IU]/mL (ref 0.450–4.500)

## 2023-08-05 DIAGNOSIS — Z86018 Personal history of other benign neoplasm: Secondary | ICD-10-CM | POA: Diagnosis not present

## 2023-08-05 DIAGNOSIS — L578 Other skin changes due to chronic exposure to nonionizing radiation: Secondary | ICD-10-CM | POA: Diagnosis not present

## 2023-08-05 DIAGNOSIS — Z85828 Personal history of other malignant neoplasm of skin: Secondary | ICD-10-CM | POA: Diagnosis not present

## 2023-08-05 DIAGNOSIS — Z859 Personal history of malignant neoplasm, unspecified: Secondary | ICD-10-CM | POA: Diagnosis not present

## 2023-08-18 DIAGNOSIS — M955 Acquired deformity of pelvis: Secondary | ICD-10-CM | POA: Diagnosis not present

## 2023-08-18 DIAGNOSIS — M5441 Lumbago with sciatica, right side: Secondary | ICD-10-CM | POA: Diagnosis not present

## 2023-08-18 DIAGNOSIS — M9905 Segmental and somatic dysfunction of pelvic region: Secondary | ICD-10-CM | POA: Diagnosis not present

## 2023-08-18 DIAGNOSIS — M9903 Segmental and somatic dysfunction of lumbar region: Secondary | ICD-10-CM | POA: Diagnosis not present

## 2023-08-27 ENCOUNTER — Other Ambulatory Visit: Payer: Self-pay | Admitting: Student in an Organized Health Care Education/Training Program

## 2023-08-27 DIAGNOSIS — M79651 Pain in right thigh: Secondary | ICD-10-CM

## 2023-08-27 DIAGNOSIS — G894 Chronic pain syndrome: Secondary | ICD-10-CM

## 2023-08-27 DIAGNOSIS — M461 Sacroiliitis, not elsewhere classified: Secondary | ICD-10-CM

## 2023-08-27 DIAGNOSIS — G5711 Meralgia paresthetica, right lower limb: Secondary | ICD-10-CM

## 2023-08-31 ENCOUNTER — Encounter: Payer: Self-pay | Admitting: Nurse Practitioner

## 2023-08-31 ENCOUNTER — Ambulatory Visit: Attending: Nurse Practitioner | Admitting: Nurse Practitioner

## 2023-08-31 VITALS — BP 151/89 | HR 88 | Temp 97.3°F | Resp 16 | Ht 61.0 in | Wt 113.0 lb

## 2023-08-31 DIAGNOSIS — M533 Sacrococcygeal disorders, not elsewhere classified: Secondary | ICD-10-CM | POA: Diagnosis not present

## 2023-08-31 DIAGNOSIS — G5711 Meralgia paresthetica, right lower limb: Secondary | ICD-10-CM | POA: Diagnosis not present

## 2023-08-31 DIAGNOSIS — M461 Sacroiliitis, not elsewhere classified: Secondary | ICD-10-CM | POA: Diagnosis not present

## 2023-08-31 DIAGNOSIS — G5703 Lesion of sciatic nerve, bilateral lower limbs: Secondary | ICD-10-CM | POA: Insufficient documentation

## 2023-08-31 DIAGNOSIS — M160 Bilateral primary osteoarthritis of hip: Secondary | ICD-10-CM | POA: Insufficient documentation

## 2023-08-31 DIAGNOSIS — M79651 Pain in right thigh: Secondary | ICD-10-CM | POA: Insufficient documentation

## 2023-08-31 DIAGNOSIS — G894 Chronic pain syndrome: Secondary | ICD-10-CM | POA: Diagnosis not present

## 2023-08-31 MED ORDER — TRAMADOL HCL 50 MG PO TABS: 50.0000 mg | ORAL_TABLET | Freq: Two times a day (BID) | ORAL | 2 refills | Status: DC | PRN

## 2023-08-31 NOTE — Progress Notes (Signed)
 Nursing Pain Medication Assessment:  Safety precautions to be maintained throughout the outpatient stay will include: orient to surroundings, keep bed in low position, maintain call bell within reach at all times, provide assistance with transfer out of bed and ambulation.  Medication Inspection Compliance: Danielle Herrera did not comply with our request to bring her pills to be counted. She was reminded that bringing the medication bottles, even when empty, is a requirement.  Medication: None brought in. Pill/Patch Count: None available to be counted. Bottle Appearance: No container available. Did not bring bottle(s) to appointment. Filled Date: N/A Last Medication intake:  Yesterday

## 2023-08-31 NOTE — Progress Notes (Signed)
 PROVIDER NOTE: Interpretation of information contained herein should be left to medically-trained personnel. Specific patient instructions are provided elsewhere under Patient Instructions section of medical record. This document was created in part using AI and STT-dictation technology, any transcriptional errors that may result from this process are unintentional.  Patient: Danielle Herrera  Service: E/M   PCP: Justus Leita DEL, MD  DOB: 1934-02-01  DOS: 08/31/2023  Provider: Emmy MARLA Blanch, NP  MRN: [id number]  Delivery: Face-to-face  Specialty: Interventional Pain Management  Type: Established Patient  Setting: Ambulatory outpatient facility  Specialty designation: 09  Referring Prov.: Justus Leita DEL, MD  Location: Outpatient office facility       History of present illness (HPI) [name], a 89 y.o. year old female, is here today because of her Chronic pain syndrome [G89.4]. [name] primary complain today is Back Pain  Pertinent problems: [name] has SI (sacroiliac) joint dysfunction; bilateral primary osteoarthritis of hip; piriformis syndrome of both sides; and chronic pain syndrome on their pertinent problem list  Pain Assessment: Severity of Chronic pain is reported as a 2 /10. Location: Back Lower/through hips and down right leg to shin. Onset: More than a month ago. Quality: Aching. Timing: Constant. Modifying factor(s): Tramadol . Vitals:  height is 5' 1 (1.549 m) and weight is 113 lb (51.3 kg). Her temperature is 97.3 F (36.3 C) (abnormal). Her blood pressure is 151/89 (abnormal) and her pulse is 88. Her respiration is 16 and oxygen saturation is 99%.  BMI: Estimated body mass index is 21.35 kg/m as calculated from the following:   Height as of this encounter: 5' 1 (1.549 m).   Weight as of this encounter: 113 lb (51.3 kg).  Last encounter: 04/13/2023 Last procedure: 11/18/2022  Reason for encounter: medication management. No change in medical history  since last visit.  Patient's pain is at baseline.  Patient continues multimodal pain regimen as prescribed.  States that it provides pain relief and improvement in functional status.  The patient continues to experience relief from the right lateral femoral cutaneous nerve block.  She stated that she might consider repeating the procedure and we will discuss this further at the next visit.   Pharmacotherapy Assessment   Analgesic: Tramadol  (Ultram ) 50 mg tablet 2 times daily as needed for pain. MME=40 Monitoring: [place] PMP: PDMP reviewed during this encounter.       Pharmacotherapy: No side-effects or adverse reactions reported. Compliance: No problems identified. Effectiveness: Clinically acceptable.  Dayna Pulling, RN  08/31/2023 11:49 AM  Sign when Signing Visit Nursing Pain Medication Assessment:  Safety precautions to be maintained throughout the outpatient stay will include: orient to surroundings, keep bed in low position, maintain call bell within reach at all times, provide assistance with transfer out of bed and ambulation.  Medication Inspection Compliance: [name] did not comply with our request to bring her pills to be counted. She was reminded that bringing the medication bottles, even when empty, is a requirement.  Medication: None brought in. Pill/Patch Count: None available to be counted. Bottle Appearance: No container available. Did not bring bottle(s) to appointment. Filled Date: N/A Last Medication intake:  Yesterday  UDS:  Summary  Date Value Ref Range Status  09/15/2022 Note  Final    Comment:    ==================================================================== Compliance Drug Analysis, Ur ==================================================================== Test  Result       Flag       Units  Drug Present and Declared for Prescription Verification   Tramadol                        >5952        EXPECTED   ng/mg creat    O-Desmethyltramadol            >5952        EXPECTED   ng/mg creat   N-Desmethyltramadol            >5952        EXPECTED   ng/mg creat    Source of tramadol  is a prescription medication. O-desmethyltramadol    and N-desmethyltramadol are expected metabolites of tramadol .    Naproxen                       PRESENT      EXPECTED ==================================================================== Test                      Result    Flag   Units      Ref Range   Creatinine              84               mg/dL      >=79 ==================================================================== Declared Medications:  The flagging and interpretation on this report are based on the  following declared medications.  Unexpected results may arise from  inaccuracies in the declared medications.   **Note: The testing scope of this panel includes these medications:   Naproxen (Aleve)  Tramadol  (Ultram )   **Note: The testing scope of this panel does not include the  following reported medications:   Amlodipine  (Norvasc )  Furosemide  (Lasix )  Lisinopril  (Zestril )  Methimazole  (Tapazole )  Multivitamin  Sennosides (Senokot)  Vitamin D3 ==================================================================== For clinical consultation, please call [phone]. ====================================================================     No results found for: CBDTHCR No results found for: D8THCCBX No results found for: D9THCCBX  ROS  Constitutional: Denies any fever or chills Gastrointestinal: No reported hemesis, hematochezia, vomiting, or acute GI distress Musculoskeletal: Lower back pain Neurological: No reported episodes of acute onset apraxia, aphasia, dysarthria, agnosia, amnesia, paralysis, loss of coordination, or loss of consciousness  Medication Review  Multiple Vitamins-Minerals, alendronate, amLODipine , carboxymethylcellul-glycerin, cholecalciferol, furosemide , lisinopril , methimazole ,  multivitamin with minerals, naproxen sodium, sennosides-docusate sodium, and traMADol   History Review  Allergy: [name] is allergic to tylenol  [acetaminophen ]. Drug: [name]  reports no history of drug use. Alcohol:  reports current alcohol use of about 3.0 standard drinks of alcohol per week. Tobacco:  reports that she has never smoked. She has never used smokeless tobacco. Social: Ms. Melvin  reports that she has never smoked. She has never used smokeless tobacco. She reports current alcohol use of about 3.0 standard drinks of alcohol per week. She reports that she does not use drugs. 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 Surgical:  Ms. Hillis  has a past surgical history that includes Appendectomy; Acoustic neuroma resection (Right, 1985); Colon surgery; Shoulder arthroscopy w/ acromial repair (Right, 89yrs ago); Tubal ligation; Parathyroidectomy (2017); Ventriculoperitoneal shunt (Right, 09/08/2012); Brain surgery (09/08/2012); Abdominal hysterectomy; Eye surgery (Right); Fracture surgery (Right); Excision/release bursa hip (Right, 10/29/2017); Cataract extraction (Bilateral); Breast excisional biopsy (Left); Breast excisional biopsy (Left); and Breast surgery (Left). Family: family history  includes Breast cancer in her paternal aunt; Dementia in her mother; Diabetes in her sister; Heart disease in her brother and father; Hypertension in her father and mother; Mental illness in her mother; Stroke in her brother.  Laboratory Chemistry Profile   Renal Lab Results  Component Value Date   BUN 15 07/21/2023   CREATININE 1.12 (H) 07/21/2023   BCR 13 07/21/2023   GFRAA 60 01/23/2020   GFRNONAA 52 (L) 01/23/2020    Hepatic Lab Results  Component Value Date   AST 23 07/21/2023   ALT 16 07/21/2023   ALBUMIN 4.4 07/21/2023   ALKPHOS 93 07/21/2023   LIPASE 39 05/28/2017    Electrolytes Lab Results  Component Value Date   NA 143 07/21/2023   K 5.0 07/21/2023   CL 104 07/21/2023   CALCIUM  10.1 07/21/2023   PHOS 3.1 02/04/2023    Bone Lab Results  Component Value Date   VD25OH 47.0 02/04/2023    Inflammation (CRP: Acute Phase) (ESR: Chronic Phase) No results found for: CRP, ESRSEDRATE, LATICACIDVEN       Note: Above Lab results reviewed.  Recent Imaging Review  MM 3D SCREENING MAMMOGRAM BILATERAL BREAST CLINICAL DATA:  Screening.  EXAM: DIGITAL SCREENING BILATERAL MAMMOGRAM WITH TOMOSYNTHESIS AND CAD  TECHNIQUE: Bilateral screening digital craniocaudal and mediolateral oblique mammograms were obtained. Bilateral screening digital breast tomosynthesis was performed. The images were evaluated with computer-aided detection.  COMPARISON:  Previous exam(s).  ACR Breast Density Category c: The breasts are heterogeneously dense, which may obscure small masses.  FINDINGS: There are no findings suspicious for malignancy.  IMPRESSION: No mammographic evidence of malignancy. A result letter of this screening mammogram will be mailed directly to the patient.  RECOMMENDATION: Screening mammogram in one year. (Code:[id number]-B-01Y)  BI-RADS CATEGORY  1: Negative.  Electronically Signed   By: Rosaline Collet M.D.   On: 03/01/2023 08:50 Note: Reviewed         Physical Exam  Vitals: BP (!) 151/89   Pulse 88   Temp (!) 97.3 F (36.3 C)   Resp 16   Ht 5' 1 (1.549 m)   Wt 113 lb (51.3 kg)   SpO2 99%   BMI 21.35 kg/m  BMI: Estimated body mass index is 21.35 kg/m as calculated from the following:   Height as of this encounter: 5' 1 (1.549 m).   Weight as of this encounter: 113 lb (51.3 kg). Ideal: Ideal body weight: 47.8 kg (105 lb 6.1 oz) Adjusted ideal body weight: 49.2 kg (108 lb 6.8 oz) General appearance: Well nourished, well developed, and well hydrated. In no apparent acute distress Mental status: Alert, oriented x 3 (person, place, & time)       Respiratory: No evidence of acute respiratory distress Eyes: PERLA Assessment   Diagnosis Status  1. Chronic pain syndrome   2. Lateral femoral cutaneous entrapment syndrome, right   3. Pain of right lateral upper thigh   4. SI joint arthritis (HCC)   5. SI (sacroiliac) joint dysfunction   6. Bilateral primary osteoarthritis of hip   7. Piriformis syndrome of both sides  Controlled Controlled Controlled   Updated Problems: No problems updated.  Plan of Care  Problem-specific:  Assessment and Plan We will continue on current medication regimen.  Prescribing drug monitoring (PDMP) reviewed; findings consistent with the use of prescribed medication.  Schedule follow-up in 3 months for medication management.  No other new issues or problems reported to this visit.  Advised the patient to bring her current pill bottle to the next visit for pill count.   Ms. Daisey Caloca Millirons has a current medication list which includes the following long-term medication(s): amlodipine , furosemide , and lisinopril .  Pharmacotherapy (Medications Ordered): Meds ordered this encounter  Medications   traMADol  (ULTRAM ) 50 MG tablet    Sig: Take 1 tablet (50 mg total) by mouth 2 (two) times daily as needed for severe pain (pain score 7-10).    Dispense:  60 tablet    Refill:  2   Orders:  No  orders of the defined types were placed in this encounter.       Return in about 3 months (around 12/01/2023) for (F2F), (MM), Emmy Blanch NP.    Recent Visits No visits were found meeting these conditions. Showing recent visits within past 90 days and meeting all other requirements Today's Visits Date Type Provider Dept  08/31/23 Office Visit Taejah Ohalloran K, NP [facility]  Showing today's visits and meeting all other requirements Future Appointments No visits were found meeting these conditions. Showing future appointments within next 90 days and meeting all other requirements  I discussed the assessment and treatment plan with the patient. The patient was provided an opportunity to ask questions and all were answered. The patient agreed with the plan and demonstrated an understanding of the instructions.  Patient advised to call back or seek an in-person evaluation if the symptoms or condition worsens.  Duration of encounter: 30 minutes.  Total time on encounter, as per AMA guidelines included both the face-to-face and non-face-to-face time personally spent by the physician and/or other qualified health care professional(s) on the day of the encounter (includes time in activities that require the physician or other qualified health care professional and does not include time in activities normally performed by clinical staff). Physician's time may include the following activities when performed: Preparing to see the patient (e.g., pre-charting review of records, searching for previously ordered imaging, lab work, and nerve conduction tests) Review of prior analgesic pharmacotherapies. Reviewing PMP Interpreting ordered tests (e.g., lab work, imaging, nerve conduction tests) Performing post-procedure evaluations, including interpretation of diagnostic procedures Obtaining and/or reviewing separately obtained history Performing a medically appropriate examination and/or  evaluation Counseling and educating the patient/family/caregiver Ordering medications, tests, or procedures Referring and communicating with other health care professionals (when not separately reported) Documenting clinical information in the electronic or other health record Independently interpreting results (not separately reported) and communicating results to the patient/ family/caregiver Care coordination (not separately reported)  Note by: Tamyah Cutbirth K Maddyson Keil, NP (TTS and AI technology used. I apologize for any typographical errors that were not detected and corrected.) Date: 08/31/2023; Time: 12:18 PM

## 2023-09-01 ENCOUNTER — Telehealth: Payer: Self-pay | Admitting: Nurse Practitioner

## 2023-09-01 NOTE — Telephone Encounter (Signed)
 Spoke with patient and informed her that prescription was sent correctly from our end. Informed her it may be an insurance issue.  Will call her pharmacy to verify.

## 2023-09-01 NOTE — Telephone Encounter (Signed)
 PT called stated that her Tramadol  prescription that was send in wrong. PT stated that the prescription states 30 days. Please give patient a call. TY

## 2023-09-01 NOTE — Telephone Encounter (Signed)
 Spoke to patient pharmacy and they stated they insurance only covers 7 day supply. That will need a prior auth.

## 2023-09-01 NOTE — Telephone Encounter (Signed)
 Spoke with pharmacist, he said her insurance only paid for 7 days' supply, but she will be able to get the remaining portion of the prescription after 7 days.  A new prescription will not need to be sent.  She had previously used a discount card, they filed her insurance this time.  Patient informed of this.

## 2023-09-15 DIAGNOSIS — M955 Acquired deformity of pelvis: Secondary | ICD-10-CM | POA: Diagnosis not present

## 2023-09-15 DIAGNOSIS — M9902 Segmental and somatic dysfunction of thoracic region: Secondary | ICD-10-CM | POA: Diagnosis not present

## 2023-09-15 DIAGNOSIS — M9905 Segmental and somatic dysfunction of pelvic region: Secondary | ICD-10-CM | POA: Diagnosis not present

## 2023-09-15 DIAGNOSIS — M5441 Lumbago with sciatica, right side: Secondary | ICD-10-CM | POA: Diagnosis not present

## 2023-09-21 DIAGNOSIS — H6123 Impacted cerumen, bilateral: Secondary | ICD-10-CM | POA: Diagnosis not present

## 2023-09-21 DIAGNOSIS — H903 Sensorineural hearing loss, bilateral: Secondary | ICD-10-CM | POA: Diagnosis not present

## 2023-09-28 ENCOUNTER — Other Ambulatory Visit: Payer: Self-pay | Admitting: Internal Medicine

## 2023-09-28 DIAGNOSIS — I10 Essential (primary) hypertension: Secondary | ICD-10-CM

## 2023-09-29 ENCOUNTER — Other Ambulatory Visit: Payer: Self-pay

## 2023-09-29 NOTE — Telephone Encounter (Signed)
 Requested medication (s) are due for refill today: yes  Requested medication (s) are on the active medication list: yes  Last refill:  02/04/23 #90 1 RF  Future visit scheduled: yes  Notes to clinic:  No results found for Mg   Requested Prescriptions  Pending Prescriptions Disp Refills   furosemide  (LASIX ) 20 MG tablet [Pharmacy Med Name: FUROSEMIDE  20MG  TABLETS] 90 tablet 1    Sig: TAKE 1 TABLET BY MOUTH ONCE DAILY     Cardiovascular:  Diuretics - Loop Failed - 09/29/2023 10:15 AM      Failed - Cr in normal range and within 180 days    Creatinine  Date Value Ref Range Status  07/23/2012 0.55 (L) 0.60 - 1.30 mg/dL Final   Creatinine, Ser  Date Value Ref Range Status  07/21/2023 1.12 (H) 0.57 - 1.00 mg/dL Final         Failed - Mg Level in normal range and within 180 days    No results found for: MG       Failed - Last BP in normal range    BP Readings from Last 1 Encounters:  08/31/23 (!) 151/89         Passed - K in normal range and within 180 days    Potassium  Date Value Ref Range Status  07/21/2023 5.0 3.5 - 5.2 mmol/L Final  07/23/2012 3.4 (L) 3.5 - 5.1 mmol/L Final         Passed - Ca in normal range and within 180 days    Calcium   Date Value Ref Range Status  07/21/2023 10.1 8.7 - 10.3 mg/dL Final   Calcium , Total  Date Value Ref Range Status  07/23/2012 9.8 8.5 - 10.1 mg/dL Final         Passed - Na in normal range and within 180 days    Sodium  Date Value Ref Range Status  07/21/2023 143 134 - 144 mmol/L Final  07/23/2012 142 136 - 145 mmol/L Final         Passed - Cl in normal range and within 180 days    Chloride  Date Value Ref Range Status  07/21/2023 104 96 - 106 mmol/L Final  07/23/2012 110 (H) 98 - 107 mmol/L Final         Passed - Valid encounter within last 6 months    Recent Outpatient Visits           2 months ago Essential (primary) hypertension   [facility] at [facility], Leita DEL, MD       Future Appointments             In 4 months Justus Leita DEL, MD [facility] at [facility], [facility]

## 2023-10-05 DIAGNOSIS — E042 Nontoxic multinodular goiter: Secondary | ICD-10-CM | POA: Diagnosis not present

## 2023-10-05 DIAGNOSIS — Z1331 Encounter for screening for depression: Secondary | ICD-10-CM | POA: Diagnosis not present

## 2023-10-05 DIAGNOSIS — E0590 Thyrotoxicosis, unspecified without thyrotoxic crisis or storm: Secondary | ICD-10-CM | POA: Diagnosis not present

## 2023-10-07 DIAGNOSIS — G8929 Other chronic pain: Secondary | ICD-10-CM | POA: Diagnosis not present

## 2023-10-13 DIAGNOSIS — H0288A Meibomian gland dysfunction right eye, upper and lower eyelids: Secondary | ICD-10-CM | POA: Diagnosis not present

## 2023-10-13 DIAGNOSIS — Z961 Presence of intraocular lens: Secondary | ICD-10-CM | POA: Diagnosis not present

## 2023-10-13 DIAGNOSIS — H0288B Meibomian gland dysfunction left eye, upper and lower eyelids: Secondary | ICD-10-CM | POA: Diagnosis not present

## 2023-10-13 DIAGNOSIS — H353132 Nonexudative age-related macular degeneration, bilateral, intermediate dry stage: Secondary | ICD-10-CM | POA: Diagnosis not present

## 2023-10-27 DIAGNOSIS — M9903 Segmental and somatic dysfunction of lumbar region: Secondary | ICD-10-CM | POA: Diagnosis not present

## 2023-10-27 DIAGNOSIS — M5441 Lumbago with sciatica, right side: Secondary | ICD-10-CM | POA: Diagnosis not present

## 2023-10-27 DIAGNOSIS — M955 Acquired deformity of pelvis: Secondary | ICD-10-CM | POA: Diagnosis not present

## 2023-10-27 DIAGNOSIS — M9905 Segmental and somatic dysfunction of pelvic region: Secondary | ICD-10-CM | POA: Diagnosis not present

## 2023-11-10 DIAGNOSIS — Z23 Encounter for immunization: Secondary | ICD-10-CM | POA: Diagnosis not present

## 2023-11-24 DIAGNOSIS — M955 Acquired deformity of pelvis: Secondary | ICD-10-CM | POA: Diagnosis not present

## 2023-11-24 DIAGNOSIS — M9903 Segmental and somatic dysfunction of lumbar region: Secondary | ICD-10-CM | POA: Diagnosis not present

## 2023-11-24 DIAGNOSIS — M9905 Segmental and somatic dysfunction of pelvic region: Secondary | ICD-10-CM | POA: Diagnosis not present

## 2023-11-24 DIAGNOSIS — M5441 Lumbago with sciatica, right side: Secondary | ICD-10-CM | POA: Diagnosis not present

## 2023-12-01 ENCOUNTER — Ambulatory Visit: Attending: Nurse Practitioner | Admitting: Nurse Practitioner

## 2023-12-01 ENCOUNTER — Encounter: Payer: Self-pay | Admitting: Nurse Practitioner

## 2023-12-01 VITALS — BP 140/85 | HR 83 | Temp 97.1°F | Resp 18 | Ht 61.0 in | Wt 113.0 lb

## 2023-12-01 DIAGNOSIS — M533 Sacrococcygeal disorders, not elsewhere classified: Secondary | ICD-10-CM | POA: Insufficient documentation

## 2023-12-01 DIAGNOSIS — M79651 Pain in right thigh: Secondary | ICD-10-CM | POA: Diagnosis not present

## 2023-12-01 DIAGNOSIS — M461 Sacroiliitis, not elsewhere classified: Secondary | ICD-10-CM | POA: Diagnosis not present

## 2023-12-01 DIAGNOSIS — M160 Bilateral primary osteoarthritis of hip: Secondary | ICD-10-CM | POA: Insufficient documentation

## 2023-12-01 DIAGNOSIS — Z79899 Other long term (current) drug therapy: Secondary | ICD-10-CM | POA: Insufficient documentation

## 2023-12-01 DIAGNOSIS — G894 Chronic pain syndrome: Secondary | ICD-10-CM | POA: Insufficient documentation

## 2023-12-01 MED ORDER — TRAMADOL HCL 50 MG PO TABS: 50.0000 mg | ORAL_TABLET | Freq: Two times a day (BID) | ORAL | 2 refills | Status: DC | PRN

## 2023-12-01 NOTE — Progress Notes (Signed)
 Nursing Pain Medication Assessment:  Safety precautions to be maintained throughout the outpatient stay will include: orient to surroundings, keep bed in low position, maintain call bell within reach at all times, provide assistance with transfer out of bed and ambulation.  Medication Inspection Compliance: Pill count conducted under aseptic conditions, in front of the patient. Neither the pills nor the bottle was removed from the patient's sight at any time. Once count was completed pills were immediately returned to the patient in their original bottle.  Medication: Tramadol  (Ultram ) Pill/Patch Count: 38 of 60 pills/patches remain Pill/Patch Appearance: Markings consistent with prescribed medication Bottle Appearance: Standard pharmacy container. Clearly labeled. Filled Date: 09 / 08 / 2025 Last Medication intake:  Today

## 2023-12-01 NOTE — Progress Notes (Signed)
 PROVIDER NOTE: Interpretation of information contained herein should be left to medically-trained personnel. Specific patient instructions are provided elsewhere under Patient Instructions section of medical record. This document was created in part using AI and STT-dictation technology, any transcriptional errors that may result from this process are unintentional.  Patient: Danielle Herrera  Service: E/M   PCP: Justus Leita DEL, MD  DOB: 1933-09-22  DOS: 12/01/2023  Provider: Emmy MARLA Blanch, NP  MRN: [id number]  Delivery: Face-to-face  Specialty: Interventional Pain Management  Type: Established Patient  Setting: Ambulatory outpatient facility  Specialty designation: 09  Referring Prov.: Justus Leita DEL, MD  Location: Outpatient office facility       History of present illness (HPI) Danielle Herrera, a 89 y.o. year old female, is here today because of her Chronic pain syndrome [G89.4]. Danielle Herrera primary complain today is Back Pain (Radiates down bilateral legs)  Pertinent problems: Danielle Herrera has SI (sacroiliac) joint dysfunction; bilateral primary osteoarthritis of hip; piriformis syndrome of both sides; and chronic pain syndrome on their pertinent problem list Pain Assessment: Severity of Chronic pain is reported as a 0-No pain/10. Location: Back Lower/Down bilateral legs. Onset: More than a month ago. Quality:  . Timing: Intermittent. Modifying factor(s): Medication, Procedure. Vitals:  height is 5' 1 (1.549 m) and weight is 113 lb (51.3 kg). Her temporal temperature is 97.1 F (36.2 C) (abnormal). Her blood pressure is 140/85 (abnormal) and her pulse is 83. Her respiration is 18 and oxygen saturation is 99%.  BMI: Estimated body mass index is 21.35 kg/m as calculated from the following:   Height as of this encounter: 5' 1 (1.549 m).   Weight as of this encounter: 113 lb (51.3 kg).  Last encounter: 08/31/2023. Last procedure: Visit date not found.  Reason for encounter:  medication management. No change in medical history since last visit.  Patient's pain is at baseline.  Patient continues multimodal pain regimen as prescribed.  States that it provides pain relief and improvement in functional status. The patient continues to experience relief from the right lateral femoral cutaneous nerve block.  She stated that she might consider repeating the procedure and we will discuss this further at the next visit.  Will continue to monitor and repeat as needed. Today she presents with her daughter. Pharmacotherapy Assessment   Analgesic: Tramadol  (Ultram ) 50 mg tablet 2 times daily as needed for pain. MME=20 Monitoring: [place] PMP: PDMP reviewed during this encounter.       Pharmacotherapy: No side-effects or adverse reactions reported. Compliance: No problems identified. Effectiveness: Clinically acceptable.  Danielle Herrera, Danielle Herrera  12/01/2023 11:46 AM  Sign when Signing Visit Nursing Pain Medication Assessment:  Safety precautions to be maintained throughout the outpatient stay will include: orient to surroundings, keep bed in low position, maintain call bell within reach at all times, provide assistance with transfer out of bed and ambulation.  Medication Inspection Compliance: Pill count conducted under aseptic conditions, in front of the patient. Neither the pills nor the bottle was removed from the patient's sight at any time. Once count was completed pills were immediately returned to the patient in their original bottle.  Medication: Tramadol  (Ultram ) Pill/Patch Count: 38 of 60 pills/patches remain Pill/Patch Appearance: Markings consistent with prescribed medication Bottle Appearance: Standard pharmacy container. Clearly labeled. Filled Date: 09 / 08 / 2025 Last Medication intake:  Today    UDS:  Summary  Date Value Ref Range Status  09/15/2022 Note  Final    Comment:    ====================================================================  Compliance Drug  Analysis, Ur ==================================================================== Test                             Result       Flag       Units  Drug Present and Declared for Prescription Verification   Tramadol                        >5952        EXPECTED   ng/mg creat   O-Desmethyltramadol            >5952        EXPECTED   ng/mg creat   N-Desmethyltramadol            >5952        EXPECTED   ng/mg creat    Source of tramadol  is a prescription medication. O-desmethyltramadol    and N-desmethyltramadol are expected metabolites of tramadol .    Naproxen                       PRESENT      EXPECTED ==================================================================== Test                      Result    Flag   Units      Ref Range   Creatinine              84               mg/dL      >=79 ==================================================================== Declared Medications:  The flagging and interpretation on this report are based on the  following declared medications.  Unexpected results may arise from  inaccuracies in the declared medications.   **Note: The testing scope of this panel includes these medications:   Naproxen (Aleve)  Tramadol  (Ultram )   **Note: The testing scope of this panel does not include the  following reported medications:   Amlodipine  (Norvasc )  Furosemide  (Lasix )  Lisinopril  (Zestril )  Methimazole  (Tapazole )  Multivitamin  Sennosides (Senokot)  Vitamin D3 ==================================================================== For clinical consultation, please call [phone]. ====================================================================     No results found for: CBDTHCR No results found for: D8THCCBX No results found for: D9THCCBX  ROS  Constitutional: Denies any fever or chills Gastrointestinal: No reported hemesis, hematochezia, vomiting, or acute GI distress Musculoskeletal: Back Pain (Radiates down bilateral legs)   Neurological: No reported episodes of acute onset apraxia, aphasia, dysarthria, agnosia, amnesia, paralysis, loss of coordination, or loss of consciousness  Medication Review  Multiple Vitamins-Minerals, alendronate, amLODipine , carboxymethylcellul-glycerin, cholecalciferol, furosemide , lisinopril , methimazole , multivitamin with minerals, naproxen sodium, sennosides-docusate sodium, and traMADol   History Review  Allergy: Danielle Herrera is allergic to tylenol  [acetaminophen ]. Drug: Danielle Herrera  reports no history of drug use. Alcohol:  reports current alcohol use of about 3.0 standard drinks of alcohol per week. Tobacco:  reports that she has never smoked. She has never used smokeless tobacco. Social: Danielle Herrera  reports that she has never smoked. She has never used smokeless tobacco. She reports current alcohol use of about 3.0 standard drinks of alcohol per week. She reports that she does not use drugs. 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 Surgical: Danielle Herrera  has a past surgical history that includes Appendectomy; Acoustic neuroma resection (Right, 1985); Colon surgery; Shoulder arthroscopy w/ acromial repair (Right, 1yrs ago); Tubal ligation; Parathyroidectomy (2017); Ventriculoperitoneal shunt (Right,  09/08/2012); Brain surgery (09/08/2012); Abdominal hysterectomy; Eye surgery (Right); Fracture surgery (Right); Excision/release bursa hip (Right, 10/29/2017); Cataract extraction (Bilateral); Breast excisional biopsy (Left); Breast excisional biopsy (Left); and Breast surgery (Left). Family: family history includes Breast cancer in her paternal aunt; Dementia in her mother; Diabetes in her sister; Heart disease in her brother and father; Hypertension in her father and mother; Mental illness in her mother; Stroke in her brother.  Laboratory Chemistry Profile   Renal Lab Results  Component Value Date   BUN 15 07/21/2023   CREATININE 1.12 (H) 07/21/2023   BCR 13 07/21/2023   GFRAA 60 01/23/2020   GFRNONAA 52 (L) 01/23/2020    Hepatic Lab Results  Component Value Date   AST 23 07/21/2023   ALT 16 07/21/2023   ALBUMIN 4.4 07/21/2023   ALKPHOS 93 07/21/2023   LIPASE 39 05/28/2017    Electrolytes Lab Results  Component Value Date   NA 143 07/21/2023   K 5.0 07/21/2023   CL 104 07/21/2023   CALCIUM  10.1 07/21/2023   PHOS 3.1 02/04/2023    Bone Lab Results  Component Value Date   VD25OH 47.0 02/04/2023    Inflammation (CRP: Acute Phase) (ESR: Chronic Phase) No results found for: CRP, ESRSEDRATE, LATICACIDVEN       Note: Above Lab results reviewed.  Recent Imaging Review    Physical Exam  Vitals: BP (!) 140/85 (BP Location: Right Arm, Patient Position: Sitting)   Pulse 83   Temp (!) 97.1 F (36.2 C) (Temporal)   Resp 18   Ht 5' 1 (1.549 m)   Wt 113 lb (51.3 kg)   SpO2 99%   BMI 21.35 kg/m  BMI: Estimated body mass index is 21.35 kg/m as calculated from the following:   Height as of this encounter: 5' 1 (1.549 m).   Weight as of this encounter: 113 lb (51.3 kg). Ideal: Ideal body weight: 47.8 kg (105 lb 6.1 oz) Adjusted ideal body weight: 49.2 kg (108 lb 6.8 oz) General appearance: Well nourished, well developed, and well hydrated. In no apparent acute  distress Mental status: Alert, oriented x 3 (person, place, & time)       Respiratory: No evidence of acute respiratory distress Eyes: PERLA  Musculoskeletal: Low back pain radiate down to the bilateral legs  Assessment   Diagnosis Status  1. Chronic pain syndrome   2. SI (sacroiliac) joint dysfunction   3. Medication management   4. SI joint arthritis   5. Pain of right lateral upper thigh   6. Bilateral primary osteoarthritis of hip    Controlled Controlled Controlled   Updated Problems: Problem  Medication Management  Pain of Right Lateral Upper Thigh    Plan of Care  Problem-specific:  Assessment and Plan  Chronic pain syndrome: Patient's pain is well-controlled with tramadol , will continue with current medication regimen.  Prescribing drug monitoring (PMP) reviewed, findings consistent with the use of prescribed medication and no evidence of narcotic misuse or abuse.  Ordered serum drug level testing to assess compliance with prescribed medications.  Schedule follow-up in 90 days for medication  management.  SI joint dysfunction: The patient continues to experience low back pain radiating to both legs, consistent with SI joint pain; however, it is well-managed with tramadol .  Danielle Herrera has a current medication list which includes the following long-term medication(s): amlodipine , furosemide , and lisinopril .  Pharmacotherapy (Medications Ordered): Meds ordered this encounter  Medications   traMADol  (ULTRAM ) 50 MG tablet    Sig: Take 1 tablet (50 mg total) by mouth 2 (two) times daily as needed for severe pain (pain score 7-10).    Dispense:  60 tablet    Refill:  2   Orders:  Orders Placed This Encounter  Procedures   Drug Screen 10 W/Conf, Serum    Release to patient:   Immediate        Return in about 3 months (around 03/02/2024) for (F2F), (MM), Emmy Blanch NP.    Recent Visits No visits were found meeting these conditions. Showing recent  visits within past 90 days and meeting all other requirements Today's Visits Date Type Provider Dept  12/01/23 Office Visit Tyrika Newman K, NP [facility]  Showing today's visits and meeting all other requirements Future Appointments No visits were found meeting these conditions. Showing future appointments within next 90 days and meeting all other requirements  I discussed the assessment and treatment plan with the patient. The patient was provided an opportunity to ask questions and all were answered. The patient agreed with the plan and demonstrated an understanding of the instructions.  Patient advised to call back or seek an in-person evaluation if the symptoms or condition worsens.  I personally spent a total of 30 minutes in the care of the patient today including preparing to see the patient, getting/reviewing separately obtained history, performing a medically appropriate exam/evaluation, counseling and educating, placing orders, referring and communicating with other health care professionals, documenting clinical information in the EHR, independently interpreting results, communicating results, and coordinating care.  Note by: Emmy MARLA Blanch, NP Date: 12/01/2023; Time: 2:13 PM

## 2023-12-02 ENCOUNTER — Other Ambulatory Visit: Payer: Self-pay | Admitting: Internal Medicine

## 2023-12-02 DIAGNOSIS — I10 Essential (primary) hypertension: Secondary | ICD-10-CM

## 2023-12-03 NOTE — Telephone Encounter (Signed)
 Requested Prescriptions  Pending Prescriptions Disp Refills   lisinopril  (ZESTRIL ) 10 MG tablet [Pharmacy Med Name: LISINOPRIL  10MG  TABLETS] 90 tablet 0    Sig: TAKE 1 TABLET BY MOUTH ONCE DAILY     Cardiovascular:  ACE Inhibitors Failed - 12/03/2023 11:36 AM      Failed - Cr in normal range and within 180 days    Creatinine  Date Value Ref Range Status  07/23/2012 0.55 (L) 0.60 - 1.30 mg/dL Final   Creatinine, Ser  Date Value Ref Range Status  07/21/2023 1.12 (H) 0.57 - 1.00 mg/dL Final         Failed - Last BP in normal range    BP Readings from Last 1 Encounters:  12/01/23 (!) 140/85         Passed - K in normal range and within 180 days    Potassium  Date Value Ref Range Status  07/21/2023 5.0 3.5 - 5.2 mmol/L Final  07/23/2012 3.4 (L) 3.5 - 5.1 mmol/L Final         Passed - Patient is not pregnant      Passed - Valid encounter within last 6 months    Recent Outpatient Visits           4 months ago Essential (primary) hypertension   [place] [facility] at [facility], Leita DEL, MD       Future Appointments             In 2 months Justus Leita DEL, MD [facility] at [facility], [phone] [place]

## 2023-12-07 ENCOUNTER — Other Ambulatory Visit: Payer: Self-pay | Admitting: Internal Medicine

## 2023-12-07 DIAGNOSIS — I10 Essential (primary) hypertension: Secondary | ICD-10-CM

## 2023-12-07 LAB — DRUG SCREEN 10 W/CONF, SERUM
Amphetamines, IA: NEGATIVE ng/mL
Barbiturates, IA: NEGATIVE ug/mL
Benzodiazepines, IA: NEGATIVE ng/mL
Cocaine & Metabolite, IA: NEGATIVE ng/mL
Methadone, IA: NEGATIVE ng/mL
Opiates, IA: NEGATIVE ng/mL
Oxycodones, IA: NEGATIVE ng/mL
Phencyclidine, IA: NEGATIVE ng/mL
Propoxyphene, IA: NEGATIVE ng/mL
THC(Marijuana) Metabolite, IA: NEGATIVE ng/mL

## 2023-12-08 NOTE — Telephone Encounter (Signed)
 Duplicate request, refilled 12/03/23.  Requested Prescriptions  Pending Prescriptions Disp Refills   lisinopril  (ZESTRIL ) 10 MG tablet [Pharmacy Med Name: LISINOPRIL  10MG  TABLETS] 90 tablet 0    Sig: TAKE 1 TABLET BY MOUTH ONCE DAILY     Cardiovascular:  ACE Inhibitors Failed - 12/08/2023  3:43 PM      Failed - Cr in normal range and within 180 days    Creatinine  Date Value Ref Range Status  07/23/2012 0.55 (L) 0.60 - 1.30 mg/dL Final   Creatinine, Ser  Date Value Ref Range Status  07/21/2023 1.12 (H) 0.57 - 1.00 mg/dL Final         Failed - Last BP in normal range    BP Readings from Last 1 Encounters:  12/01/23 (!) 140/85         Passed - K in normal range and within 180 days    Potassium  Date Value Ref Range Status  07/21/2023 5.0 3.5 - 5.2 mmol/L Final  07/23/2012 3.4 (L) 3.5 - 5.1 mmol/L Final         Passed - Patient is not pregnant      Passed - Valid encounter within last 6 months    Recent Outpatient Visits           4 months ago Essential (primary) hypertension    Primary Care & Sports Medicine at [facility], Leita DEL, MD       Future Appointments             In 1 month Justus, Leita DEL, MD [facility] at [facility], [phone] [place]

## 2023-12-26 ENCOUNTER — Other Ambulatory Visit: Payer: Self-pay | Admitting: Internal Medicine

## 2023-12-26 DIAGNOSIS — I10 Essential (primary) hypertension: Secondary | ICD-10-CM

## 2023-12-28 NOTE — Telephone Encounter (Signed)
 Requested Prescriptions  Pending Prescriptions Disp Refills   furosemide  (LASIX ) 20 MG tablet [Pharmacy Med Name: FUROSEMIDE  20MG  TABLETS] 90 tablet 0    Sig: TAKE 1 TABLET BY MOUTH ONCE DAILY     Cardiovascular:  Diuretics - Loop Failed - 12/28/2023 10:42 AM      Failed - Cr in normal range and within 180 days    Creatinine  Date Value Ref Range Status  07/23/2012 0.55 (L) 0.60 - 1.30 mg/dL Final   Creatinine, Ser  Date Value Ref Range Status  07/21/2023 1.12 (H) 0.57 - 1.00 mg/dL Final         Failed - Mg Level in normal range and within 180 days    No results found for: MG       Failed - Last BP in normal range    BP Readings from Last 1 Encounters:  12/01/23 (!) 140/85         Passed - K in normal range and within 180 days    Potassium  Date Value Ref Range Status  07/21/2023 5.0 3.5 - 5.2 mmol/L Final  07/23/2012 3.4 (L) 3.5 - 5.1 mmol/L Final         Passed - Ca in normal range and within 180 days    Calcium   Date Value Ref Range Status  07/21/2023 10.1 8.7 - 10.3 mg/dL Final   Calcium , Total  Date Value Ref Range Status  07/23/2012 9.8 8.5 - 10.1 mg/dL Final         Passed - Na in normal range and within 180 days    Sodium  Date Value Ref Range Status  07/21/2023 143 134 - 144 mmol/L Final  07/23/2012 142 136 - 145 mmol/L Final         Passed - Cl in normal range and within 180 days    Chloride  Date Value Ref Range Status  07/21/2023 104 96 - 106 mmol/L Final  07/23/2012 110 (H) 98 - 107 mmol/L Final         Passed - Valid encounter within last 6 months    Recent Outpatient Visits           5 months ago Essential (primary) hypertension   [place] [facility] at [facility], Leita DEL, MD       Future Appointments             In 1 month Justus, Leita DEL, MD [facility] at [facility], [phone] [place]

## 2023-12-29 DIAGNOSIS — M9905 Segmental and somatic dysfunction of pelvic region: Secondary | ICD-10-CM | POA: Diagnosis not present

## 2023-12-29 DIAGNOSIS — M955 Acquired deformity of pelvis: Secondary | ICD-10-CM | POA: Diagnosis not present

## 2023-12-29 DIAGNOSIS — M5441 Lumbago with sciatica, right side: Secondary | ICD-10-CM | POA: Diagnosis not present

## 2023-12-29 DIAGNOSIS — M9903 Segmental and somatic dysfunction of lumbar region: Secondary | ICD-10-CM | POA: Diagnosis not present

## 2024-01-24 DIAGNOSIS — M9903 Segmental and somatic dysfunction of lumbar region: Secondary | ICD-10-CM | POA: Diagnosis not present

## 2024-01-24 DIAGNOSIS — M5441 Lumbago with sciatica, right side: Secondary | ICD-10-CM | POA: Diagnosis not present

## 2024-01-24 DIAGNOSIS — M9905 Segmental and somatic dysfunction of pelvic region: Secondary | ICD-10-CM | POA: Diagnosis not present

## 2024-01-24 DIAGNOSIS — M955 Acquired deformity of pelvis: Secondary | ICD-10-CM | POA: Diagnosis not present

## 2024-02-02 ENCOUNTER — Ambulatory Visit (INDEPENDENT_AMBULATORY_CARE_PROVIDER_SITE_OTHER): Admitting: Internal Medicine

## 2024-02-02 ENCOUNTER — Encounter: Payer: Self-pay | Admitting: Internal Medicine

## 2024-02-02 VITALS — BP 130/78 | Ht 61.0 in | Wt 113.0 lb

## 2024-02-02 DIAGNOSIS — I10 Essential (primary) hypertension: Secondary | ICD-10-CM

## 2024-02-02 DIAGNOSIS — Z0000 Encounter for general adult medical examination without abnormal findings: Secondary | ICD-10-CM

## 2024-02-02 DIAGNOSIS — N1831 Chronic kidney disease, stage 3a: Secondary | ICD-10-CM | POA: Diagnosis not present

## 2024-02-02 DIAGNOSIS — E0590 Thyrotoxicosis, unspecified without thyrotoxic crisis or storm: Secondary | ICD-10-CM

## 2024-02-02 DIAGNOSIS — Z23 Encounter for immunization: Secondary | ICD-10-CM

## 2024-02-02 DIAGNOSIS — Z1231 Encounter for screening mammogram for malignant neoplasm of breast: Secondary | ICD-10-CM

## 2024-02-02 DIAGNOSIS — E782 Mixed hyperlipidemia: Secondary | ICD-10-CM

## 2024-02-02 DIAGNOSIS — G912 (Idiopathic) normal pressure hydrocephalus: Secondary | ICD-10-CM

## 2024-02-02 MED ORDER — LISINOPRIL 10 MG PO TABS: 10.0000 mg | ORAL_TABLET | Freq: Every day | ORAL | 1 refills | Status: AC

## 2024-02-02 MED ORDER — FUROSEMIDE 20 MG PO TABS: 20.0000 mg | ORAL_TABLET | Freq: Every day | ORAL | 1 refills | Status: DC

## 2024-02-02 NOTE — Assessment & Plan Note (Signed)
 Controlled with diet only. Lab Results  Component Value Date   LDLCALC 154 (H) 02/04/2023

## 2024-02-02 NOTE — Assessment & Plan Note (Signed)
 Monitoring, avoiding nephrotoxic agents. GFR ~ 50

## 2024-02-02 NOTE — Assessment & Plan Note (Signed)
 Well controlled blood pressure today. Current regimen is lisinopril , amlodipine  and furosemide . No medication side effects noted.

## 2024-02-02 NOTE — Assessment & Plan Note (Signed)
 Treated with VP shunt 2014 Having some mild memory issues but supportive family Wearing Life Alert No falls or gait concerns; no headaches or visual changes; no urinary incontinence

## 2024-02-02 NOTE — Progress Notes (Signed)
 Date:  02/02/2024   Name:  Danielle Herrera   DOB:  December 27, 1933   MRN:  [id number]   Chief Complaint: No chief complaint on file. Danielle Herrera is a 89 y.o. female who presents today for her Complete Annual Exam. She feels well. She reports exercising walking 1/2 mile per day. She reports she is sleeping fairly well. Breast complaints none.  Health Maintenance  Topic Date Due   Breast Cancer Screening  02/22/2024   COVID-19 Vaccine (9 - Pfizer risk 2025-26 season) 05/09/2024   Medicare Annual Wellness Visit  05/25/2024   DTaP/Tdap/Td vaccine (2 - Td or Tdap) 08/12/2024   Pneumococcal Vaccine for age over 26  Completed   Flu Shot  Completed   Osteoporosis screening with Bone Density Scan  Completed   Zoster (Shingles) Vaccine  Completed   Meningitis B Vaccine  Aged Out    Hypertension This is a chronic problem. The problem is controlled. Pertinent negatives include no chest pain, headaches, palpitations or shortness of breath. Past treatments include calcium  channel blockers, diuretics and ACE inhibitors. The current treatment provides significant improvement. Hypertensive end-organ damage includes kidney disease. There is no history of CAD/MI or CVA. Identifiable causes of hypertension include a thyroid  problem.  Thyroid  Problem Presents for follow-up (followed by Endocrinology) visit. Patient reports no anxiety, constipation, diarrhea, fatigue or palpitations. The symptoms have been stable.  Osteopenia/OP - followed by Endo, on Alendronate.  Last DEXA  04/2023.  Review of Systems  Constitutional:  Negative for fatigue and unexpected weight change.  HENT:  Negative for trouble swallowing.   Eyes:  Negative for visual disturbance.  Respiratory:  Negative for cough, chest tightness, shortness of breath and wheezing.   Cardiovascular:  Negative for chest pain, palpitations and leg swelling.  Gastrointestinal:  Negative for abdominal pain, constipation and diarrhea.   Genitourinary:  Negative for difficulty urinating, dysuria, frequency and urgency.  Musculoskeletal:  Positive for arthralgias and back pain. Negative for gait problem and myalgias.  Skin:  Negative for color change and rash.  Neurological:  Negative for dizziness, weakness, light-headedness and headaches.  Psychiatric/Behavioral:  Positive for decreased concentration. Negative for dysphoric mood and sleep disturbance. The patient is not nervous/anxious.      Lab Results  Component Value Date   NA 143 07/21/2023   K 5.0 07/21/2023   CO2 21 07/21/2023   GLUCOSE 106 (H) 07/21/2023   BUN 15 07/21/2023   CREATININE 1.12 (H) 07/21/2023   CALCIUM  10.1 07/21/2023   EGFR 48 (L) 07/21/2023   GFRNONAA 52 (L) 01/23/2020   Lab Results  Component Value Date   CHOL 267 (H) 02/04/2023   HDL 98 02/04/2023   LDLCALC 154 (H) 02/04/2023   TRIG 91 02/04/2023   CHOLHDL 2.7 02/04/2023   Lab Results  Component Value Date   TSH 0.596 07/21/2023   No results found for: HGBA1C Lab Results  Component Value Date   WBC 7.0 02/04/2023   HGB 13.4 02/04/2023   HCT 41.1 02/04/2023   MCV 89 02/04/2023   PLT 297 02/04/2023   Lab Results  Component Value Date   ALT 16 07/21/2023   AST 23 07/21/2023   ALKPHOS 93 07/21/2023   BILITOT 0.3 07/21/2023   Lab Results  Component Value Date   VD25OH 47.0 02/04/2023     Patient Active Problem List   Diagnosis Date Noted   Medication management 12/01/2023   Pain of right lateral upper thigh 12/01/2023   Lateral femoral cutaneous  entrapment syndrome, right 11/27/2022   Senile ecchymosis 11/27/2022   SI (sacroiliac) joint dysfunction 09/15/2022   Bilateral primary osteoarthritis of hip 09/15/2022   Piriformis syndrome of both sides 09/15/2022   Chronic pain syndrome 09/15/2022   CKD stage 3a, GFR 45-59 ml/min (HCC) 08/04/2022   Enthesopathy of hip region on both sides 08/04/2022   Dysphagia 06/09/2022   Slow transit constipation 01/23/2020    Benign essential tremor 03/10/2018   Pseudophakia of right eye 12/12/2015   History of parathyroidectomy 07/10/2015   Hyperthyroidism, subclinical 04/22/2015   Hyperlipidemia, mixed 08/13/2014   Vitamin D  deficiency 08/13/2014   Ventricular shunt in place 08/13/2014   Esophageal reflux 08/13/2014   Normal pressure hydrocephalus (HCC) 11/05/2012   Abnormal gait 08/26/2012   Goiter, nontoxic, multinodular 05/23/2012   History of acoustic neuroma 01/20/2010   Colon polyp 01/20/2010   Dependent edema 01/20/2010   Essential (primary) hypertension 01/20/2010   Arthritis of knee, left 01/20/2010   Osteopenia 01/20/2010    Allergies  Allergen Reactions   Tylenol  [Acetaminophen ] Other (See Comments)    Elevated LFTs    Past Surgical History:  Procedure Laterality Date   ABDOMINAL HYSTERECTOMY     ACOUSTIC NEUROMA RESECTION Right 1985   APPENDECTOMY     BRAIN SURGERY  09/08/2012   burr hole with biopsy; cranial tongs caliper/stereotactic frame right (Dr. Norleen Kayla Imus)   BREAST EXCISIONAL BIOPSY Left    benign   BREAST EXCISIONAL BIOPSY Left    benign   BREAST SURGERY Left    lumpectomy   CATARACT EXTRACTION Bilateral    right eye several years ago; left eye fall 2021   COLON SURGERY     EXCISION/RELEASE BURSA HIP Right 10/29/2017   Procedure: EXCISION/RELEASE BURSA HIP- IT BAND;  Surgeon: Tobie Priest, MD;  Location: [facility];  Service: Orthopedics;  Laterality: Right;   EYE SURGERY Right    cataract   FRACTURE SURGERY Right    shoulder   PARATHYROIDECTOMY  2017   partial for hyperparathyroidism   SHOULDER ARTHROSCOPY W/ ACROMIAL REPAIR Right 30yrs ago   TUBAL LIGATION     VENTRICULOPERITONEAL SHUNT Right 09/08/2012   for NPH    Social History   Tobacco Use   Smoking status: Never   Smokeless tobacco: Never  Vaping Use   Vaping status: Never Used  Substance Use Topics   Alcohol use: Yes    Alcohol/week: 3.0 standard drinks of alcohol    Types: 3 Glasses of  wine per week    Comment: Occasionally   Drug use: Never     Medication list has been reviewed and updated.  Current Meds  Medication Sig   alendronate (FOSAMAX) 70 MG tablet Take 70 mg by mouth once a week.   amLODipine  (NORVASC ) 10 MG tablet Take 1 tablet (10 mg total) by mouth daily.   carboxymethylcellul-glycerin (OPTIVE) 0.5-0.9 % ophthalmic solution Place 1 drop into both eyes 2 (two) times daily.   cholecalciferol (VITAMIN D3) 25 MCG (1000 UNIT) tablet Take 1,000 Units by mouth daily.   methimazole  (TAPAZOLE ) 5 MG tablet Take 5 mg by mouth daily. Dr. Lelon- [facility].   Multiple Vitamin (MULTIVITAMIN WITH MINERALS) TABS tablet Take 1 tablet by mouth daily. Centrum Silver for Women 50+   Multiple Vitamins-Minerals (PRESERVISION AREDS 2 PO) Take 1 each by mouth 2 (two) times daily.   naproxen sodium (ALEVE) 220 MG tablet Take 4 tablets by mouth daily.   sennosides-docusate sodium (SENOKOT-S) 8.6-50 MG tablet Take 2 tablets  by mouth 2 (two) times daily.    traMADol  (ULTRAM ) 50 MG tablet Take 1 tablet (50 mg total) by mouth 2 (two) times daily as needed for severe pain (pain score 7-10).   [DISCONTINUED] furosemide  (LASIX ) 20 MG tablet TAKE 1 TABLET BY MOUTH ONCE DAILY   [DISCONTINUED] lisinopril  (ZESTRIL ) 10 MG tablet TAKE 1 TABLET BY MOUTH ONCE DAILY       02/02/2024    9:58 AM 07/21/2023   10:42 AM 02/04/2023    9:14 AM 11/27/2022    1:30 PM  GAD 7 : Generalized Anxiety Score  Nervous, Anxious, on Edge 0 0 0 0  Control/stop worrying 0 0 0 0  Worry too much - different things 0 0 0 0  Trouble relaxing 0 0 0 0  Restless 0 0 0 0  Easily annoyed or irritable 0 0 0 0  Afraid - awful might happen 0 0 0 0  Total GAD 7 Score 0 0 0 0  Anxiety Difficulty Not difficult at all Not difficult at all Not difficult at all Not difficult at all       02/02/2024    9:58 AM 12/01/2023   11:17 AM 08/31/2023   11:48 AM  Depression screen PHQ 2/9  Decreased Interest 0 0 0  Down, Depressed,  Hopeless 0 0 0  PHQ - 2 Score 0 0 0  Altered sleeping 0    Tired, decreased energy 0    Change in appetite 0    Feeling bad or failure about yourself  0    Trouble concentrating 0    Moving slowly or fidgety/restless 0    Suicidal thoughts 0    PHQ-9 Score 0      BP Readings from Last 3 Encounters:  02/02/24 130/78  12/01/23 (!) 140/85  08/31/23 (!) 151/89    Physical Exam Vitals and nursing note reviewed.  Constitutional:      General: She is not in acute distress.    Appearance: Normal appearance. She is well-developed.  HENT:     Head: Normocephalic and atraumatic.     Right Ear: Tympanic membrane and ear canal normal.     Left Ear: Tympanic membrane and ear canal normal.     Nose:     Right Sinus: No maxillary sinus tenderness.     Left Sinus: No maxillary sinus tenderness.  Eyes:     General: No scleral icterus.       Right eye: No discharge.        Left eye: No discharge.     Conjunctiva/sclera: Conjunctivae normal.  Neck:     Thyroid : No thyromegaly.     Vascular: No carotid bruit.     Trachea: Trachea normal.     Comments: Shunt in place right supraclavicular fossa  Cardiovascular:     Rate and Rhythm: Normal rate and regular rhythm. No extrasystoles are present.    Pulses:          Radial pulses are 2+ on the right side and 2+ on the left side.       Dorsalis pedis pulses are 1+ on the right side and 1+ on the left side.     Heart sounds: Normal heart sounds.  Pulmonary:     Effort: Pulmonary effort is normal. No respiratory distress.     Breath sounds: No decreased breath sounds, wheezing or rhonchi.  Abdominal:     General: Abdomen is flat. Bowel sounds are normal.     Palpations: Abdomen  is soft.     Tenderness: There is no abdominal tenderness. There is no guarding or rebound.  Musculoskeletal:     Cervical back: Normal range of motion. No erythema.     Right lower leg: No edema.     Left lower leg: No edema.  Lymphadenopathy:     Cervical: No  cervical adenopathy.  Skin:    General: Skin is warm and dry.     Findings: No rash.  Neurological:     Mental Status: She is alert and oriented to person, place, and time.     Cranial Nerves: No cranial nerve deficit.     Sensory: No sensory deficit.     Deep Tendon Reflexes: Reflexes are normal and symmetric.  Psychiatric:        Attention and Perception: Attention normal.        Mood and Affect: Mood normal.        Behavior: Behavior normal.        Thought Content: Thought content normal.     Wt Readings from Last 3 Encounters:  02/02/24 113 lb (51.3 kg)  12/01/23 113 lb (51.3 kg)  08/31/23 113 lb (51.3 kg)    BP 130/78 (BP Location: Left Arm, Patient Position: Sitting, Cuff Size: Normal)   Ht 5' 1 (1.549 m)   Wt 113 lb (51.3 kg)   BMI 21.35 kg/m   Assessment and Plan:  Problem List Items Addressed This Visit       Unprioritized   Hyperlipidemia, mixed (Chronic)   Controlled with diet only. Lab Results  Component Value Date   LDLCALC 154 (H) 02/04/2023         Relevant Medications   lisinopril  (ZESTRIL ) 10 MG tablet   furosemide  (LASIX ) 20 MG tablet   Other Relevant Orders   Lipid panel   Essential (primary) hypertension (Chronic)   Well controlled blood pressure today. Current regimen is lisinopril , amlodipine  and furosemide . No medication side effects noted.        Relevant Medications   lisinopril  (ZESTRIL ) 10 MG tablet   furosemide  (LASIX ) 20 MG tablet   Other Relevant Orders   CBC with Differential/Platelet   Comprehensive metabolic panel with GFR   Urinalysis, Routine w reflex microscopic   Normal pressure hydrocephalus (HCC) (Chronic)   Treated with VP shunt 2014 Having some mild memory issues but supportive family Wearing Life Alert No falls or gait concerns; no headaches or visual changes; no urinary incontinence      Hyperthyroidism, subclinical (Chronic)   Lab Results  Component Value Date   TSH 0.596 07/21/2023  No change in  medications per Endo.      Relevant Orders   TSH + free T4   CKD stage 3a, GFR 45-59 ml/min (HCC) (Chronic)   Monitoring, avoiding nephrotoxic agents. GFR ~ 50      Relevant Orders   Comprehensive metabolic panel with GFR   Other Visit Diagnoses       Annual physical exam    -  Primary     Encounter for screening mammogram for breast cancer       Relevant Orders   MM 3D SCREENING MAMMOGRAM BILATERAL BREAST     Flu vaccine need       Relevant Orders   Flu vaccine HIGH DOSE PF(Fluzone Trivalent) (Completed)       Return in about 6 months (around 08/02/2024) for HTN.    Leita HILARIO Adie, MD [facility] [place]

## 2024-02-02 NOTE — Assessment & Plan Note (Signed)
 Lab Results  Component Value Date   TSH 0.596 07/21/2023  No change in medications per Endo.

## 2024-02-02 NOTE — Patient Instructions (Signed)
 Call [facility] to schedule your mammogram at [phone].

## 2024-02-03 ENCOUNTER — Ambulatory Visit: Payer: Self-pay | Admitting: Internal Medicine

## 2024-02-03 LAB — COMPREHENSIVE METABOLIC PANEL WITH GFR
ALT: 20 IU/L (ref 0–32)
AST: 25 IU/L (ref 0–40)
Albumin: 4.4 g/dL (ref 3.7–4.7)
Alkaline Phosphatase: 67 IU/L (ref 48–129)
BUN/Creatinine Ratio: 16 (ref 12–28)
BUN: 17 mg/dL (ref 8–27)
Bilirubin Total: 0.3 mg/dL (ref 0.0–1.2)
CO2: 19 mmol/L — ABNORMAL LOW (ref 20–29)
Calcium: 10.1 mg/dL (ref 8.7–10.3)
Chloride: 104 mmol/L (ref 96–106)
Creatinine, Ser: 1.08 mg/dL — ABNORMAL HIGH (ref 0.57–1.00)
Globulin, Total: 2.4 g/dL (ref 1.5–4.5)
Glucose: 85 mg/dL (ref 70–99)
Potassium: 4.1 mmol/L (ref 3.5–5.2)
Sodium: 141 mmol/L (ref 134–144)
Total Protein: 6.8 g/dL (ref 6.0–8.5)
eGFR: 49 mL/min/1.73 — ABNORMAL LOW (ref >59)

## 2024-02-03 LAB — URINALYSIS, ROUTINE W REFLEX MICROSCOPIC
Bilirubin, UA: NEGATIVE
Glucose, UA: NEGATIVE
Ketones, UA: NEGATIVE
Leukocytes,UA: NEGATIVE
Nitrite, UA: NEGATIVE
Protein,UA: NEGATIVE
RBC, UA: NEGATIVE
Specific Gravity, UA: 1.016 (ref 1.005–1.030)
Urobilinogen, Ur: 0.2 mg/dL (ref 0.2–1.0)
pH, UA: 5.5 (ref 5.0–7.5)

## 2024-02-03 LAB — CBC WITH DIFFERENTIAL/PLATELET
Basophils Absolute: 0.1 x10E3/uL (ref 0.0–0.2)
Basos: 1 %
EOS (ABSOLUTE): 0.1 x10E3/uL (ref 0.0–0.4)
Eos: 1 %
Hematocrit: 42.1 % (ref 34.0–46.6)
Hemoglobin: 13.6 g/dL (ref 11.1–15.9)
Immature Grans (Abs): 0 x10E3/uL (ref 0.0–0.1)
Immature Granulocytes: 0 %
Lymphocytes Absolute: 1.5 x10E3/uL (ref 0.7–3.1)
Lymphs: 20 %
MCH: 28.8 pg (ref 26.6–33.0)
MCHC: 32.3 g/dL (ref 31.5–35.7)
MCV: 89 fL (ref 79–97)
Monocytes Absolute: 0.6 x10E3/uL (ref 0.1–0.9)
Monocytes: 8 %
Neutrophils Absolute: 5.4 x10E3/uL (ref 1.4–7.0)
Neutrophils: 70 %
Platelets: 299 x10E3/uL (ref 150–450)
RBC: 4.72 x10E6/uL (ref 3.77–5.28)
RDW: 12.7 % (ref 11.7–15.4)
WBC: 7.6 x10E3/uL (ref 3.4–10.8)

## 2024-02-03 LAB — TSH+FREE T4
Free T4: 1.16 ng/dL (ref 0.82–1.77)
TSH: 0.637 u[IU]/mL (ref 0.450–4.500)

## 2024-02-03 LAB — LIPID PANEL
Chol/HDL Ratio: 2.8 ratio (ref 0.0–4.4)
Cholesterol, Total: 253 mg/dL — ABNORMAL HIGH (ref 100–199)
HDL: 92 mg/dL (ref >39)
LDL Chol Calc (NIH): 142 mg/dL — ABNORMAL HIGH (ref 0–99)
Triglycerides: 112 mg/dL (ref 0–149)
VLDL Cholesterol Cal: 19 mg/dL (ref 5–40)

## 2024-03-04 NOTE — Progress Notes (Unsigned)
 PROVIDER NOTE: Interpretation of information contained herein should be left to medically-trained personnel. Specific patient instructions are provided elsewhere under Patient Instructions section of medical record. This document was created in part using AI and STT-dictation technology, any transcriptional errors that may result from this process are unintentional.  Patient: Danielle Herrera  Service: E/M   PCP: Justus Leita DEL, MD  DOB: 01/07/1934  DOS: 03/07/2024  Provider: Emmy MARLA Blanch, NP  MRN: [id number]  Delivery: Face-to-face  Specialty: Interventional Pain Management  Type: Established Patient  Setting: Ambulatory outpatient facility  Specialty designation: 09  Referring Prov.: Justus Leita DEL, MD  Location: Outpatient office facility       History of present illness (HPI) Danielle Herrera, a 89 y.o. year old female, is here today because of her Pain of right lateral upper thigh [M79.651]. Danielle Herrera primary complain today is Hip Pain  Pertinent problems: Danielle Herrera has History of acoustic neuroma; Arthritis of knee, left; Normal pressure hydrocephalus (HCC); CKD stage 3a, GFR 45-59 ml/min (HCC); Enthesopathy of hip region on both sides; SI (sacroiliac) joint dysfunction; Bilateral primary osteoarthritis of hip; Piriformis syndrome of both sides; Chronic pain syndrome; Lateral femoral cutaneous entrapment syndrome, right; Medication management; and Pain of right lateral upper thigh on their pertinent problem list.  Pain Assessment: Severity of Chronic pain is reported as a 2 /10. Location: Hip Right, Left/Bilateral legs. Onset: More than a month ago. Quality: Aching. Timing: Intermittent. Modifying factor(s): Rest, pain medication. Vitals:  height is 5' 1 (1.549 m) and weight is 113 lb (51.3 kg). Her temporal temperature is 97.2 F (36.2 C) (abnormal). Her blood pressure is 137/85 and her pulse is 83. Her respiration is 18 and oxygen saturation is 97%.  BMI: Estimated body  mass index is 21.35 kg/m as calculated from the following:   Height as of this encounter: 5' 1 (1.549 m).   Weight as of this encounter: 113 lb (51.3 kg).  Last encounter: 12/01/2023. Last procedure: Visit date not found.  Reason for encounter: medication management. No change in medical history since last visit.  Patient's pain is at baseline.  Patient continues multimodal pain regimen as prescribed.  States that it provides pain relief and improvement in functional status.   Discussed the use of AI scribe software for clinical note transcription with the patient, who gave verbal consent to proceed.  History of Present Illness   Danielle Herrera is an 89 year old female who presents with chronic right hip pain.  She has persistent right hip pain following a labral repair surgery. The pain began after a physical therapy session where she was instructed to pedal on a bicycle for ten minutes and perform twenty leg lifts. Significant pain was experienced the following morning, and she has had ongoing pain since then.  She received an injection 16 months ago, which provided significant relief. She recently experienced a sprained ankle and wondered if it could be related to decreased sensitivity in her lower leg after a femoral nerve block, though this was not confirmed.  She uses a walker for mobility and reports limited walking without it, even at home. She is currently taking tramadol , mostly once a day, occasionally twice, and supplements with Aleve in the morning and at night.     Pharmacotherapy Assessment   Analgesic: Tramadol  (Ultram ) 50 mg tablet 2 times daily as needed for pain. MME=20 Monitoring: [place] PMP: PDMP reviewed during this encounter.       Pharmacotherapy: No side-effects  or adverse reactions reported. Compliance: No problems identified. Effectiveness: Clinically acceptable.  [name], [place]  03/07/2024 11:19 AM  Sign when Signing Visit Nursing Pain Medication  Assessment:  Safety precautions to be maintained throughout the outpatient stay will include: orient to surroundings, keep bed in low position, maintain call bell within reach at all times, provide assistance with transfer out of bed and ambulation.  Medication Inspection Compliance: Pill count conducted under aseptic conditions, in front of the patient. Neither the pills nor the bottle was removed from the patient's sight at any time. Once count was completed pills were immediately returned to the patient in their original bottle.  Medication: Tapentadol (Nucynta) Pill/Patch Count: 44 of 60 pills/patches remain Pill/Patch Appearance: Markings consistent with prescribed medication Bottle Appearance: Standard pharmacy container. Clearly labeled. Filled Date: 01 / 03 / 26 Last Medication intake:  Today    UDS:  Summary  Date Value Ref Range Status  09/15/2022 Note  Final    Comment:    ==================================================================== Compliance Drug Analysis, Ur ==================================================================== Test                             Result       Flag       Units  Drug Present and Declared for Prescription Verification   Tramadol                        >5952        EXPECTED   ng/mg creat   O-Desmethyltramadol            >5952        EXPECTED   ng/mg creat   N-Desmethyltramadol            >5952        EXPECTED   ng/mg creat    Source of tramadol  is a prescription medication. O-desmethyltramadol    and N-desmethyltramadol are expected metabolites of tramadol .    Naproxen                       PRESENT      EXPECTED ==================================================================== Test                      Result    Flag   Units      Ref Range   Creatinine              84               mg/dL      >=79 ==================================================================== Declared Medications:  The flagging and interpretation on this report  are based on the  following declared medications.  Unexpected results may arise from  inaccuracies in the declared medications.   **Note: The testing scope of this panel includes these medications:   Naproxen (Aleve)  Tramadol  (Ultram )   **Note: The testing scope of this panel does not include the  following reported medications:   Amlodipine  (Norvasc )  Furosemide  (Lasix )  Lisinopril  (Zestril )  Methimazole  (Tapazole )  Multivitamin  Sennosides (Senokot)  Vitamin D3 ==================================================================== For clinical consultation, please call [phone]. ====================================================================     No results found for: CBDTHCR No results found for: D8THCCBX No results found for: D9THCCBX  ROS  Constitutional: Denies any fever or chills Gastrointestinal: No reported hemesis, hematochezia, vomiting, or acute GI distress Musculoskeletal: Right hip pain  Neurological: No reported episodes of acute  onset apraxia, aphasia, dysarthria, agnosia, amnesia, paralysis, loss of coordination, or loss of consciousness  Medication Review  Multiple Vitamins-Minerals, alendronate, amLODipine , carboxymethylcellul-glycerin, cholecalciferol, furosemide , lisinopril , methimazole , multivitamin with minerals, naproxen sodium, sennosides-docusate sodium, and traMADol   History Review  Allergy: Danielle Herrera is allergic to tylenol  [acetaminophen ]. Drug: Danielle Herrera  reports no history of drug use. Alcohol:  reports current alcohol use of about 3.0 standard drinks of alcohol per week. Tobacco:  reports that she has never smoked. She has never used smokeless tobacco. Social: Danielle Herrera  reports that she has never smoked. She has never used smokeless tobacco. She reports current alcohol use of about 3.0 standard drinks of alcohol per week. She reports that she does not use drugs. 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 Surgical: Danielle Herrera  has a past surgical history that includes Appendectomy; Acoustic neuroma resection (Right, 1985); Colon surgery; Shoulder arthroscopy w/ acromial repair (Right, 53yrs ago); Tubal ligation; Parathyroidectomy (2017); Ventriculoperitoneal shunt (Right, 09/08/2012); Brain surgery (09/08/2012); Abdominal hysterectomy; Eye surgery (Right); Fracture surgery (Right); Excision/release bursa hip (Right, 10/29/2017); Cataract extraction (Bilateral); Breast excisional biopsy (Left); Breast excisional biopsy (Left); and Breast surgery (Left). Family: family history includes Breast cancer in her paternal aunt; Dementia in her mother; Diabetes in her sister; Heart disease in her brother and father; Hypertension in her father and mother; Mental illness in her mother; Stroke in her brother.  Laboratory Chemistry Profile   Renal Lab Results  Component Value Date   BUN 17 02/02/2024   CREATININE 1.08 (H) 02/02/2024   BCR 16 02/02/2024   GFRAA 60 01/23/2020   GFRNONAA 52 (L) 01/23/2020    Hepatic Lab Results  Component Value Date   AST 25 02/02/2024   ALT 20 02/02/2024   ALBUMIN 4.4 02/02/2024   ALKPHOS 67 02/02/2024   LIPASE 39 05/28/2017    Electrolytes Lab Results  Component Value  Date   NA 141 02/02/2024   K 4.1 02/02/2024   CL 104 02/02/2024   CALCIUM  10.1 02/02/2024   PHOS 3.1 02/04/2023    Bone Lab Results  Component Value Date   VD25OH 47.0 02/04/2023    Inflammation (CRP: Acute Phase) (ESR: Chronic Phase) No results found for: CRP, ESRSEDRATE, LATICACIDVEN       Note: Above Lab results reviewed.  Recent Imaging Review  MM 3D SCREENING MAMMOGRAM BILATERAL BREAST CLINICAL DATA:  Screening.  EXAM: DIGITAL SCREENING BILATERAL MAMMOGRAM WITH TOMOSYNTHESIS AND CAD  TECHNIQUE: Bilateral screening digital craniocaudal and mediolateral oblique mammograms were obtained. Bilateral screening digital breast tomosynthesis was performed. The images were evaluated with computer-aided detection.  COMPARISON:  Previous exam(s).  ACR Breast Density Category c: The breasts are heterogeneously dense, which may obscure small masses.  FINDINGS: There are no findings suspicious for malignancy.  IMPRESSION: No mammographic evidence of malignancy. A result letter of this screening mammogram will be mailed directly to the patient.  RECOMMENDATION: Screening mammogram in one year. (Code:SM-B-01Y)  BI-RADS CATEGORY  1: Negative.  Electronically Signed   By: Rosaline Collet M.D.   On: 03/01/2023  08:50 Note: Reviewed        Physical Exam  Vitals: BP 137/85 (BP Location: Right Arm, Patient Position: Sitting, Cuff Size: Normal)   Pulse 83   Temp (!) 97.2 F (36.2 C) (Temporal)   Resp 18   Ht 5' 1 (1.549 m)   Wt 113 lb (51.3 kg)   SpO2 97%   BMI 21.35 kg/m  BMI: Estimated body mass index is 21.35 kg/m as calculated from the following:   Height as of this encounter: 5' 1 (1.549 m).   Weight as of this encounter: 113 lb (51.3 kg). Ideal: Ideal body weight: 47.8 kg (105 lb 6.1 oz) Adjusted ideal body weight: 49.2 kg (108 lb 6.8 oz) General appearance: Well nourished, well developed, and well hydrated. In no apparent acute distress Mental  status: Alert, oriented x 3 (person, place, & time)       Respiratory: No evidence of acute respiratory distress Eyes: PERLA  Musculoskeletal: Right hip pain  Assessment   Diagnosis Status  1. Pain of right lateral upper thigh   2. Medication management   3. Chronic pain syndrome   4. SI (sacroiliac) joint dysfunction   5. SI joint arthritis   6. Bilateral primary osteoarthritis of hip   7. Lateral femoral cutaneous entrapment syndrome, right   8. Piriformis syndrome of both sides    Controlled Controlled Controlled   Updated Problems: No problems updated.  Plan of Care  Problem-specific:  Assessment and Plan    Chronic pain syndrome Managed with tramadol  and Aleve. Previous femoral nerve block provided significant relief for over a year. - Continue tramadol  as needed, primarily once daily with occasional second dose in the afternoon. - Continue Aleve in the morning and at night. - Will repeat femoral nerve block if pain becomes intolerable.  Right hip pain secondary to osteoarthritis and prior labral repair Right hip pain persists following labral repair surgery. Pain exacerbated by physical therapy exercises post-surgery. No current acute pain reported. Previous femoral nerve block provided significant relief. - Will consider repeating femoral nerve block if pain becomes intolerable.  Medication management for chronic pain Current medication regimen includes tramadol  and Aleve. Tramadol  is used sparingly, primarily once daily with occasional second dose. Aleve is taken in the morning and at night. Prescription for tramadol  is managed through [facility]. - Renewed tramadol  prescription at [facility]. - Continue current medication regimen with tramadol  and Aleve.  Patient's pain is controlled with tramadol , will continue with current medication regimen.  Prescribing drug monitoring (PMP) reviewed, findings consistent with the use of prescribed medication and no evidence of  narcotic misuse or abuse.  Routine UDS ordered today.  No side effects or adverse reaction reported to medication. Schedule follow-up in 90 days for medication management.       Danielle Herrera has a current medication list which includes the following long-term medication(s): amlodipine , furosemide , and lisinopril .  Pharmacotherapy (Medications Ordered): Meds ordered this encounter  Medications   traMADol  (ULTRAM ) 50 MG tablet    Sig: Take 1 tablet (50 mg total) by mouth 2 (two) times daily as needed for severe pain (pain score 7-10).    Dispense:  60 tablet    Refill:  2   Orders:  Orders Placed This Encounter  Procedures   ToxASSURE Select 13 (MW), Urine    Volume: 30 ml(s). Minimum 3 ml of urine is needed. Document temperature of fresh sample. Indications: Long term (current) use of opiate analgesic (S20.108)    Release to  patient:   Immediate        Return in about 3 months (around 06/05/2024) for (F2F), (MM), Emmy Blanch NP.    Recent Visits No visits were found meeting these conditions. Showing recent visits within past 90 days and meeting all other requirements Today's Visits Date Type Provider Dept  03/07/24 Office Visit Clarine Elrod K, NP [facility]  Showing today's visits and meeting all other requirements Future Appointments No visits were found meeting these conditions. Showing future appointments within next 90 days and meeting all other requirements  I discussed the assessment and treatment plan with the patient. The patient was provided an opportunity to ask questions and all were answered. The patient agreed with the plan and demonstrated an understanding of the instructions.  Patient advised to call back or seek an in-person evaluation if the symptoms or condition worsens.  I personally spent a total of 30 minutes in the care of the patient today including preparing to see the patient, getting/reviewing separately obtained history,  performing a medically appropriate exam/evaluation, counseling and educating, placing orders, referring and communicating with other health care professionals, documenting clinical information in the EHR, independently interpreting results, communicating results, and coordinating care.   Note by: Taraoluwa Thakur K Raiana Pharris, NP (TTS and AI technology used. I apologize for any typographical errors that were not detected and corrected.) Date: 03/07/2024; Time: 12:12 PM

## 2024-03-07 ENCOUNTER — Ambulatory Visit: Attending: Nurse Practitioner | Admitting: Nurse Practitioner

## 2024-03-07 ENCOUNTER — Encounter: Payer: Self-pay | Admitting: Nurse Practitioner

## 2024-03-07 VITALS — BP 137/85 | HR 83 | Temp 97.2°F | Resp 18 | Ht 61.0 in | Wt 113.0 lb

## 2024-03-07 DIAGNOSIS — G5703 Lesion of sciatic nerve, bilateral lower limbs: Secondary | ICD-10-CM | POA: Insufficient documentation

## 2024-03-07 DIAGNOSIS — Z79899 Other long term (current) drug therapy: Secondary | ICD-10-CM | POA: Diagnosis not present

## 2024-03-07 DIAGNOSIS — M533 Sacrococcygeal disorders, not elsewhere classified: Secondary | ICD-10-CM | POA: Diagnosis not present

## 2024-03-07 DIAGNOSIS — G894 Chronic pain syndrome: Secondary | ICD-10-CM | POA: Diagnosis not present

## 2024-03-07 DIAGNOSIS — G5711 Meralgia paresthetica, right lower limb: Secondary | ICD-10-CM | POA: Diagnosis not present

## 2024-03-07 DIAGNOSIS — M160 Bilateral primary osteoarthritis of hip: Secondary | ICD-10-CM | POA: Diagnosis not present

## 2024-03-07 DIAGNOSIS — M79651 Pain in right thigh: Secondary | ICD-10-CM | POA: Diagnosis not present

## 2024-03-07 DIAGNOSIS — M461 Sacroiliitis, not elsewhere classified: Secondary | ICD-10-CM | POA: Diagnosis not present

## 2024-03-07 MED ORDER — TRAMADOL HCL 50 MG PO TABS: 50.0000 mg | ORAL_TABLET | Freq: Two times a day (BID) | ORAL | 2 refills | Status: AC | PRN

## 2024-03-07 NOTE — Progress Notes (Signed)
 Nursing Pain Medication Assessment:  Safety precautions to be maintained throughout the outpatient stay will include: orient to surroundings, keep bed in low position, maintain call bell within reach at all times, provide assistance with transfer out of bed and ambulation.  Medication Inspection Compliance: Pill count conducted under aseptic conditions, in front of the patient. Neither the pills nor the bottle was removed from the patient's sight at any time. Once count was completed pills were immediately returned to the patient in their original bottle.  Medication: Tapentadol (Nucynta) Pill/Patch Count: 44 of 60 pills/patches remain Pill/Patch Appearance: Markings consistent with prescribed medication Bottle Appearance: Standard pharmacy container. Clearly labeled. Filled Date: 01 / 03 / 26 Last Medication intake:  Today

## 2024-03-07 NOTE — Patient Instructions (Signed)
 " ______________________________________________________________________    Opioid Pain Medication Update  To: All patients taking opioid pain medications. (I.e.: hydrocodone, hydromorphone , oxycodone , oxymorphone, morphine, codeine, methadone, tapentadol, tramadol , buprenorphine, fentanyl , etc.)  Re: Updated review of side effects and adverse reactions of opioid analgesics, as well as new information about long term effects of this class of medications.  Direct risks of long-term opioid therapy are not limited to opioid addiction and overdose. Potential medical risks include serious fractures, breathing problems during sleep, hyperalgesia, immunosuppression, chronic constipation, bowel obstruction, myocardial infarction, and tooth decay secondary to xerostomia.  Unpredictable adverse effects that can occur even if you take your medication correctly: Cognitive impairment, respiratory depression, and death. Most people think that if they take their medication correctly, and as instructed, that they will be safe. Nothing could be farther from the truth. In reality, a significant amount of recorded deaths associated with the use of opioids has occurred in individuals that had taken the medication for a long time, and were taking their medication correctly. The following are examples of how this can happen: Patient taking his/her medication for a long time, as instructed, without any side effects, is given a certain antibiotic or another unrelated medication, which in turn triggers a Drug-to-drug interaction leading to disorientation, cognitive impairment, impaired reflexes, respiratory depression or an untoward event leading to serious bodily harm or injury, including death.  Patient taking his/her medication for a long time, as instructed, without any side effects, develops an acute impairment of liver and/or kidney function. This will lead to a rapid inability of the body to breakdown and eliminate  their pain medication, which will result in effects similar to an overdose, but with the same medicine and dose that they had always taken. This again may lead to disorientation, cognitive impairment, impaired reflexes, respiratory depression or an untoward event leading to serious bodily harm or injury, including death.  A similar problem will occur with patients as they grow older and their liver and kidney function begins to decrease as part of the aging process.  Background information: Historically, the original case for using long-term opioid therapy to treat chronic noncancer pain was based on safety assumptions that subsequent experience has called into question. In 1996, the American Pain Society and the American Academy of Pain Medicine issued a consensus statement supporting long-term opioid therapy. This statement acknowledged the dangers of opioid prescribing but concluded that the risk for addiction was low; respiratory depression induced by opioids was short-lived, occurred mainly in opioid-naive patients, and was antagonized by pain; tolerance was not a common problem; and efforts to control diversion should not constrain opioid prescribing. This has now proven to be wrong. Experience regarding the risks for opioid addiction, misuse, and overdose in community practice has failed to support these assumptions.  According to the Centers for Disease Control and Prevention, fatal overdoses involving opioid analgesics have increased sharply over the past decade. Currently, more than 96,700 people die from drug overdoses every year. Opioids are a factor in 7 out of every 10 overdose deaths. Deaths from drug overdose have surpassed motor vehicle accidents as the leading cause of death for individuals between the ages of 34 and 4.  Clinical data suggest that neuroendocrine dysfunction may be very common in both men and women, potentially causing hypogonadism, erectile dysfunction, infertility,  decreased libido, osteoporosis, and depression. Recent studies linked higher opioid dose to increased opioid-related mortality. Controlled observational studies reported that long-term opioid therapy may be associated with increased risk for cardiovascular events.  Subsequent meta-analysis concluded that the safety of long-term opioid therapy in elderly patients has not been proven.   Side Effects and adverse reactions: Common side effects: Drowsiness (sedation). Dizziness. Nausea and vomiting. Constipation. Physical dependence -- Dependence often manifests with withdrawal symptoms when opioids are discontinued or decreased. Tolerance -- As you take repeated doses of opioids, you require increased medication to experience the same effect of pain relief. Respiratory depression -- This can occur in healthy people, especially with higher doses. However, people with COPD, asthma or other lung conditions may be even more susceptible to fatal respiratory impairment.  Uncommon side effects: An increased sensitivity to feeling pain and extreme response to pain (hyperalgesia). Chronic use of opioids can lead to this. Delayed gastric emptying (the process by which the contents of your stomach are moved into your small intestine). Muscle rigidity. Immune system and hormonal dysfunction. Quick, involuntary muscle jerks (myoclonus). Arrhythmia. Itchy skin (pruritus). Dry mouth (xerostomia).  Long-term side effects: Chronic constipation. Sleep-disordered breathing (SDB). Increased risk of bone fractures. Hypothalamic-pituitary-adrenal dysregulation. Increased risk of overdose.  RISKS: Respiratory depression and death: Opioids increase the risk of respiratory depression and death.  Drug-to-drug interactions: Opioids are relatively contraindicated in combination with benzodiazepines, sleep inducers, and other central nervous system depressants. Other classes of medications (i.e.: certain antibiotics  and even over-the-counter medications) may also trigger or induce respiratory depression in some patients.  Medical conditions: Patients with pre-existing respiratory problems are at higher risk of respiratory failure and/or depression when in combination with opioid analgesics. Opioids are relatively contraindicated in some medical conditions such as central sleep apnea.   Fractures and Falls:  Opioids increase the risk and incidence of falls. This is of particular importance in elderly patients.  Endocrine System:  Long-term administration is associated with endocrine abnormalities (endocrinopathies). (Also known as Opioid-induced Endocrinopathy) Influences on both the hypothalamic-pituitary-adrenal axis?and the hypothalamic-pituitary-gonadal axis have been demonstrated with consequent hypogonadism and adrenal insufficiency in both sexes. Hypogonadism and decreased levels of dehydroepiandrosterone sulfate have been reported in men and women. Endocrine effects include: Amenorrhoea in women (abnormal absence of menstruation) Reduced libido in both sexes Decreased sexual function Erectile dysfunction in men Hypogonadisms (decreased testicular function with shrinkage of testicles) Infertility Depression and fatigue Loss of muscle mass Anxiety Depression Immune suppression Hyperalgesia Weight gain Anemia Osteoporosis Patients (particularly women of childbearing age) should avoid opioids. There is insufficient evidence to recommend routine monitoring of asymptomatic patients taking opioids in the long-term for hormonal deficiencies.  Immune System: Human studies have demonstrated that opioids have an immunomodulating effect. These effects are mediated via opioid receptors both on immune effector cells and in the central nervous system. Opioids have been demonstrated to have adverse effects on antimicrobial response and anti-tumour surveillance. Buprenorphine has been demonstrated to have  no impact on immune function.  Opioid Induced Hyperalgesia: Human studies have demonstrated that prolonged use of opioids can lead to a state of abnormal pain sensitivity, sometimes called opioid induced hyperalgesia (OIH). Opioid induced hyperalgesia is not usually seen in the absence of tolerance to opioid analgesia. Clinically, hyperalgesia may be diagnosed if the patient on long-term opioid therapy presents with increased pain. This might be qualitatively and anatomically distinct from pain related to disease progression or to breakthrough pain resulting from development of opioid tolerance. Pain associated with hyperalgesia tends to be more diffuse than the pre-existing pain and less defined in quality. Management of opioid induced hyperalgesia requires opioid dose reduction.  Cancer: Chronic opioid therapy has been associated with an increased  risk of cancer among noncancer patients with chronic pain. This association was more evident in chronic strong opioid users. Chronic opioid consumption causes significant pathological changes in the small intestine and colon. Epidemiological studies have found that there is a link between opium dependence and initiation of gastrointestinal cancers. Cancer is the second leading cause of death after cardiovascular disease. Chronic use of opioids can cause multiple conditions such as GERD, immunosuppression and renal damage as well as carcinogenic effects, which are associated with the incidence of cancers.   Mortality: Long-term opioid use has been associated with increased mortality among patients with chronic non-cancer pain (CNCP).  Prescription of long-acting opioids for chronic noncancer pain was associated with a significantly increased risk of all-cause mortality, including deaths from causes other than overdose.  Reference: Von Korff M, Kolodny A, Deyo RA, Chou R. Long-term opioid therapy reconsidered. Ann Intern Med. 2011 Sep 6;155(5):325-8. doi:  10.7326/[id number]. PMID: 78106373; PMCID: [id number]. Kit JINNY Laurence CINDERELLA Pearley JINNY, Hayward RA, Dunn KM, Jordan KP. Risk of adverse events in patients prescribed long-term opioids: A cohort study in the [place] Clinical Practice Research [employer]. Eur J Pain. 2019 May;23(5):908-922. doi: 10.1002/ejp.1357. Epub 2019 Jan 31. PMID: 69379883. Colameco S, Coren JS, Ciervo CA. Continuous opioid treatment for chronic noncancer pain: a time for moderation in prescribing. Postgrad Med. 2009 Jul;121(4):61-6. doi: 10.3810/pgm.[id number]. PMID: [id number]. Gigi JONELLE Shlomo MILUS Levern IVER Conny RN, [place], Blazina I, Lonell DASEN, Bougatsos C, Deyo RA. The effectiveness and risks of long-term opioid therapy for chronic pain: a systematic review for a [employer] of [employer] Pathways to [employer]. Ann Intern Med. 2015 Feb 17;162(4):276-86. doi: 10.7326/M14-2559. PMID: 74418742. Rory CHRISTELLA Laurence [facility], [place] DM. NCHS Data Brief No. 22. [place]: Centers for Disease Control and Prevention; 2009. Sep, Increase in Fatal Poisonings Involving Opioid Analgesics in the [place] , 1999-2006. Song IA, Choi HR, Oh TK. Long-term opioid use and mortality in patients with chronic non-cancer pain: Ten-year follow- ______________________________________________________________________    Update on Controlled Substance (Opioid) Regulations   To: All patients taking opioid pain medications. (I.e.: hydrocodone, hydromorphone , oxycodone , oxymorphone, morphine, codeine, methadone, tapentadol, tramadol , buprenorphine, fentanyl , etc.)  Re: Review on the state of controlled substance regulations.  Introduction: Rules and regulations associated with all aspects of controlled substances are constantly being modified. Unfortunately we have encountered patients questioning the veracity of the information that we provide them about these changes. This is intended to provide them with appropriate references and a  historical review of these changes.  A Brief History: As of December 16, 2015, the [place]  Government declared the opioid epidemic a public health emergency. Prescription drug monitoring programs (PDMPs) and the [place] All Schedules Prescription Electronic Reporting Act (NASPER). Before 1800, clinicians regarded pain as an existential phenomenon, a consequence of aging. There was no regulation on the use of cocaine and opioids, resulting in widespread marketing and prescribing for many ailments ranging from diarrhea to toothache. The [employer] of [phone], passed in response to the sudden emergence of street heroin abuse as well as iatrogenic morphine dependence, influenced both physician and patient alike to avoid opiates. Patients with unexplained pain in the 1920s were regarded as deluded, malingering, or abusers, and cancer patients through the 1950s were encouraged to wean themselves off opioids until their lives could be measured in weeks. Alongside this opioid evolution, the American Pain Society launched their influential pain as the fifth vital sign campaign in 1995. Concurrently, pharmaceutical companies introduced new formulations, such as extended release oxycodone  (  OxyContin ). From 1995-04-06 to 05-Apr-2000, OxyContin  prescriptions increased from 670,000 to 6.2 million. However, concerns soon began to surface regarding overzealous opioid treatment. It must be noted that pharmaceutical companies contributed significantly to the rise of the opioid epidemic, receiving considerable reprimands as a consequence. In 04/05/05, as the opioid epidemic began to inflict profound damage, [employer] pleaded guilty to federal charges related to the misbranding of OxyContin . [employer] agreed to pay a total of $634.5 million to resolve Justice Department investigations, as well as a $19.5 million settlement to [address] and the [address] of [place].  In response to the current epidemic, changes in focus to the  development of new abuse deterrent opioid formulations at the [place]  Food and Drug Administration (FDA) as well as drafting of new public standards for pain treatment were created at [facility] in 04-06-2015. In response to the opioid epidemic, FDA public policy changes were announced in February 2016. Among these new positions were a re-examination of the risk-benefit paradigm for opioids with strict emphasis on the large public health ramifications. The various modified opioids released over the past 20 years, such as tamper-resistant preparation, have had differing levels of success, and are collectively referred to as Risk Evaluation and Mitigation Strategies (REMS). There is also a growing focus on preventing opioid use disorder (OUD) and on offering affected individuals accessible and effective treatment. [place]  government policy reflects these changes and both the Affordable Care Act and the Mental Health Parity and Addiction Equity Act were major steps forward in treating opioid addiction. The Affordable Care Act, which was signed into law in 04-05-2008, with major provisions coming into effect by April 05, 2012.  In the 1990s, the intensified marketing of newly reformulated prescription opioid medications (e.g., OxyContin ) and an influential pain advocacy campaign that encouraged greater pain management led to a precipitous rise in opioid use in the [place] . Research from the Centers for Disease Control and Prevention (CDC) shows that prescription opioid sales in the [place]  quadrupled from 1997-04-05 to 04-05-08. At the same time, opioid misuse and opioid-involved overdose deaths increased (Figure 1). Between 1997-04-05 and 2008-04-05, the rate of opioidinvolved overdose deaths in the [place]  doubled from 2.9 to 6.8 deaths per 100,000 people. This initial rise in opioid-related deaths is often referred to as the first wave of the recent opioid crisis.  Between Apr 05, 1997 and 04/05/18, 565,000 Americans died of opioid-involved overdoses. In  turn, federal, state, and local governments responded with various legal and policy efforts to curb opioid misuse and drug-related overdose Deaths.  Recent Congresses have enacted several laws addressing the opioid crisis, such as the Comprehensive Addiction and Recovery Act of Apr 05, 2014 (CARA, P.L. 114-198); the 21st Century Cures Act (P.L. 114-255); the Substance UseDisorder Prevention that Promotes Opioid Recovery and Treatment for Patients and Communities Act (SUPPORT Act, P.L. [phone]); the Fentanyl  Sanctions Act (Title LXXII of P.L. [id number]); and the Blocking Deadly Fentanyl  Imports Act (P.L. 117-81, 6610). These laws addressed overprescribing and misuse of opioids, expanded substance use disorder prevention and treatment capacities, bolstered drug diversion capabilities, and enhanced international drug interdiction, counternarcotics cooperation, and sanctions efforts. Congress also directed additional funds to many of these initiatives through appropriations.  Congress provided funding in the [employer] Act of April 06, 2019 [phone]; P.L. 117-2) for syringe services programs (often known as needle exchange programs) and other harm reduction initiatives. Federal and state harm reduction strategies have frequently involved the distribution of naloxone (e.g., Narcan)--a medication used to reverse an opioid overdose--and test strips used to detect  fentanyl  in drug samples.  The Department of Justice (DOJ) and Department of Homeland Security [facility]) aim to reduce the diversion of prescription opioids and the use, manufacturing, and trafficking of illicit opioids. DOJ--via the Drug Enforcement Administration (DEA)--regulates opioid manufacturers, distributors, and dispensers; it also controls the opioid supply through enforcement of regulatory requirements.  A History of Opiate Laws in the [place]   Prior to 1890, laws concerning opiates were strictly imposed on a local city or  state-by-state basis. One of the first was in [place] in 1875 where it became illegal to smoke opium only in opium dens. It did not ban the sale, import or use otherwise. In the next 25 years different states enacted opium laws ranging from outlawing opium dens altogether to making possession of opium, morphine and heroin without a physicians prescription illegal.  The first Congressional Act took place in 1890 that levied taxes on morphine and opium. From that time on the [employer] has had a series of laws and acts directly aimed at opiate use, abuse and control. These are outlined below:  1906 - Pure Food and Drug Act Preventing the manufacture, sale, or transportation of adulterated or misbranded or poisonous or deleterious foods, drugs, medicines, and liquors, and for regulating traffic therein, and for other purposes. Punishment included fines and prison time.  1909   - Smoking Opium Exclusion Act Banned the importation, possession and use of smoking opium. Did not regulate opium-based medications. First Freight forwarder banning the non-medical use of a substance.  1914  - The Margrette Act In summary, The Margrette Act of 1914 was written more to have all parties involved in importing, exporting, set designer and distributing opium or cocaine to register with the [employer] and have taxes levied upon them. Exempt from the law were physicians operating in the course of his professional practice  1919 - Supreme Court ratified the Bj's in [place] et al., v. [place]  and [place]  v. [place], then again in [facility] v. [place] , in 1920, holding that doctors may not prescribe maintenance supplies of narcotics to people addicted to narcotics. However, it does not prohibit doctors from prescribing narcotics to wean a patient off of the drug. It was also the opinion of the court that prescribing narcotics to habitual users was not considered  professional practice hence it then was considered illegal for doctors to prescribe opioids for the purposes of maintaining an addiction. It can be argued that todays addiction medications are not intended to maintain an addiction but to facilitate addiction remission. In which case, this opinion of the court should not preclude practitioners from prescribing buprenorphine or methadone to patients suffering from an addictive disorder.  1924  - Heroin Act Architectural technologist, importation and possession of heroin illegal - even for medicinal use.  1922 -- Narcotic Drug Import and Export Act Enacted to assure proper control of importation, sale, possession, production and consumption of narcotics.  1927  -- Special Educational Needs Teacher of [employer] of Prohibition was responsible for tracking bootleggers and organized conservation officer, historic buildings. They focused primarily on interstate and international cases and those cases where local law enforcement official would not or could not act.  1932 -- Uniform State Narcotic Act Encouraged states to pass uniform state laws matching the federal Narcotic Drug Import and Export Act. Suggested prohibiting cannabis use at the state level.  24 -- Food, Drug, and Cosmetic Act The new law brought cosmetics and medical devices under control, and it  required that drugs be labeled with adequate directions for safe use. Moreover, it mandated pre-market approval of all new drugs, such that a manufacturer would have to prove to FDA that a drug were safe before it could be sold  1951 -- Boggs Act Imposed maximum criminal penalties for violations of the import/export and internal revenue laws related to drugs and also established mandatory minimum prison sentences.  1956 -- Narcotics Control Act Increased Boggs Act penalties and mandatory prison sentence minimums for violations of existing drug laws.  1965 -- Drug Abuse Control Amendment Enacted to deal with problems caused by abuse  of depressants, stimulants and hallucinogens. Restricted research into psychoactive drugs such as LSD by requiring FDA approval.  1970 -- Controlled Substance Act  Controlled Substances Import and Export Act These laws are a consolidation of numerous laws regulating the manufacture and distribution of narcotics, stimulants, depressants, hallucinogens, anabolic steroids, and chemicals used in the illicit production of controlled substances. The CSA places all substances that are regulated under existing federal law into one of five schedules. This placement is based upon the substance's medicinal value, harmfulness, and potential for abuse or addiction. Schedule I is reserved for the most dangerous drugs that have no recognized medical use, while Schedule V is the classification used for the least dangerous drugs. The act also provides a mechanism for substances to be controlled, added to a schedule, decontrolled, removed from control, rescheduled, or transferred from one schedule to another.  43 - Drug Enforcement Agency By Executive Order, the DEA was formed to take place of the [employer] of Narcotics and Dangerous Drugs.  49 -Narcotic Addict Treatment Act of  1974  - Public Law [phone] Amends the Controlled Substance Act of 1970 to provide for the registration of practitioners conducting narcotic treatment programs. [methadone clinics] It also provides legal definitions for the phrases maintenance treatment and detoxification treatment.  1986 -- Anti-Drug Abuse Act of 1986 Strengthened Federal efforts to encourage foreign cooperation in eradicating illicit drug crops and in halting international drug traffic, to improve enforcement of Federal drug laws and enhance interdiction of illicit drug shipments, to provide strong Market researcher in establishing effective drug abuse prevention and education programs, to [employer] support for drug abuse treatment and rehabilitation efforts, and for  other purposes. It also re-imposed mandatory sentencing minimums depending on which drug and how much was involved.  1988 -- Anti-Drug Abuse Act of 1988 Established the Office of Materials Engineer (ONDCP) in the [employer] of the Economist; authorized funds for [employer], state and local drug enforcement activities, school-based drug prevention efforts, and drug abuse treatment with special emphasis on injecting drug abusers at high risk for AIDS.  2000 -- Federal - The Drug Addiction Treatment Act of 2000 (DATA 2000) It enables qualified physicians to prescribe and/or dispense narcotics for the purpose of treating opioid dependency. For the first time, physicians are able to treat this disease from their private offices or other clinical settings. This presents a very desirable treatment option for those who are unwilling or unable to seek help in drug treatment clinics. Patients can now be treated in the privacy of their doctors office, as are other people being treated for any other type of medical condition. One medicine doctors may now prescribe is Buprenorphine. The major downfall of this Act is the limitation of 30 patients per practice - which means that large facilities, no matter how many physicians are there, can only treat 30 patients at a time.  2002-- DEA reschedules buprenorphine from a schedule V drug to a schedule III drug, on December 06, 2000 - the day before the FDA approval of Suboxone and Subutex despite overwhelming objection by the medical community.  2004: June 2004 THE CONFIDENTIALITY OF ALCOHOL AND DRUG ABUSE PATIENT RECORDS REGULATION AND THE HIPAA PRIVACY RULE:  Confidentiality of Alcohol and Drug Dependence Patient Records (summary) Code of Federal Regulations Title 42 Part 2 (42 CFR Part 2)  The confidentiality of alcohol and drug dependence patient records maintained by this practice/program is protected by federal law and regulations. Generally, the  practice/program may not say to a person outside the practice/program that a patient attends the practice/program, or disclose any information identifying a patient as being alcohol or drug dependent unless:  The patient consents in writing; The disclosure is allowed by a court order, or The disclosure is made to medical personnel in a medical emergency or to qualified personnel for research,  audit, or practice/program evaluation. Violation of the federal law and regulations by a practice/program is a crime. Suspected violations may be reported to appropriate authorities in accordance with federal regulations. Freight forwarder and regulations do not protect any information about a crime committed by a patient either at the practice/program or against any person who works for the practice/program or about any threat to commit such a crime. Federal laws and regulations do not protect any information about suspected child abuse or neglect from being reported under state law to appropriate state or local authorities.  sample consent form (MS-WORD)  2005: 10-02-2003 Public law [phone], Amends the Controlled Substances Act to eliminate the 30-patient limit for medical group practices allowed to dispense narcotic drugs in schedules III, IV, or V for maintenance or detoxification treatment (retains the 30-patient limit for an individual physician). This amendment removes the 30-patient limit on group medical practices that treat opioid dependence with buprenorphine. The restriction was part of the original Drug Addiction Treatment Act of 2000 (DATA) that allowed treatment of opioid dependence in a doctor's office. With this change, every certified doctor may now prescribe buprenorphine up to his or her individual physician limit of 30 patients.  2006: On 02/27/2005 President Levy signed Bill H.R.6344 into law. This allows physicians who have been certified to prescribe certain drugs for the treatment of opioid  dependence under DATA2000 to treat up to 100 patients (up from 30) by submitting an intent notification to the Dept of Health and [employer]. This is a major step forward in both fighting the stigma and allowing access to treatment previously not available to some. For more details see 30/100-PATIENT LIMIT  2016: HHS augments regulations concerning the 30/100 patient limit by raising the limit to 275 for qualifying physicians. Link to summary of regulation  2016: Comprehensive Addiction and Recovery Act of 2016 (sec.303) amends the Controlled Substance Act - to allow Nurse Practitioners and Physician Assistants to become eligible to prescribe buprenorphine for the treatment of opioid use disorder. See the entire law for more details.  The roots of the concurrent regulation of certain drugs under two statutory schemes go back to the beginning of this century. In 1906, Congress enacted the Pure Food and Drug Act, establishing one regime of regulation to assure (among other things) that drugs were not adulterated or misbranded. These regulations were amended several times, recodified in 1938, and expanded on again from the 1940s through the 1990s. Their implementation and enforcement is today assigned to the Food and Drug Administration (FDA) in the  Department of Health and Health And Safety Inspector [facility]).  In 1914, Congress adopted the [place] Narcotic Act to stop abuse of addictive drugs. The [place] Narcotic Act was amended in 1937 to include marijuana. In 1965, amphetamines, barbiturates, and hallucinogens came under regulation, but under the [employer], Drug, and Cosmetic Act. In 1970, these various statutes were consolidated and recodified as the Controlled Substances Act (CSA), which has been amended several times since then. Its implementation and enforcement is today assigned to the Drug Enforcement Administration (DEA) in the Department of Justice.  The first clash occurred after World War I, when  so-called morphine clinics existed and physicians prescribed or dispensed morphine to addicts. Some addicts were veterans of the American Civil War, the Spanish-American War, and WWI, who had become addicted during treatment for war wounds, but most of them came from the growing population of nonmedical addicts ([place], 8017). The Narcotics Division of the Prohibition Unit of the Department of the Treasury, which was then responsible for enforcing the [facility] Narcotic Act, concluded that this activity was not the legitimate practice of medicine but simple drug trafficking. The Treasury Department swiftly closed the clinics and made it personally and professionally risky for physicians to maintain a narcotic addict for any reason. In did so, however, only after the American Medical Association had adopted a resolution, in 1920, opposing ambulatory clinics''.  In 1972, the public health establishment, including the Secretary of Health, Education, and Welfare, the Education Officer, Environmental, the [employer] of [employer], and the Chemical Engineer for Drug Abuse Prevention, was unprepared to allow [employer] of Narcotics and Dangerous Drugs, DEA's predecessor agency, to unilaterally define the parameters of medical practice for the use of methadone in the treatment of heroin addiction. As a consequence, a new set of rules--the third, on top of the FDA and DEA schemes--was added, one that inserted FDA deeply into the practice of medicine, notwithstanding its protestations to the contrary. Congress ratified this joint responsibility of law enforcement and public health officials for methadone through this third set of rules in 1974 with the passage of the Narcotic Addict Treatment Act (NATA). To examine in detail the evolution of this third set of rules--commonly referred to as the FDA or DHHS methadone regulations--we turn, first, to the period of the mid-1960s.  Increased use of heroin in  the post World War II period first became apparent in the early to mid 1950s. During the [employer], a minimum mandatory narcotics law was enacted in 1956, effective July 1957. 1962 [facility] conference on drug abuse, the [employer] on Narcotic and Drug Abuse (the [employer]) of 1963, the Drug Abuse Control Amendments of 1965, the President's Commission on [employer] and Administration of Justice (the [employer]) of [phone], and the Narcotic Addiction Rehabilitation Act of 1966.  The 1965 Drug Abuse Control Amendments brought under strict federal control all nonnarcotic drugs capable of producing serious psychotoxic effects when abused. This act also created the [employer] of Drug Abuse Control within the Department of Health, Education, and Welfare (DHEW) and shifted the basis for aon corporation of illegal drugs from tax principles (administered by the Department of Treasury) to the regulation of commerce (administered by the [employer]).  The 1966 Narcotic Addiction Rehabilitation Act TOUR MANAGER) authorized the civil commitment of narcotic addicts, and federal assistance to state and local governments to develop a local system of drug treatment programs. With respect to the latter, the [employer] of Mental Health [facility])  initially proposed the gradual implementation of the state assistance effort, mainly through a common mental health mechanism--inpatient treatment programs. However, because of a perceived pressing need, the courts began to commit addicts to these programs even before they were officially opened or staffed. The NARA legislation imposed the following contract requirements on treatment centers: (1) thrice-a-week counseling sessions; (2) weekly urine tests; (3) restorative dental services; (4) psychological consultations and vocational training; and (5) the treatment modalities of drug-free outpatient, therapeutic community, and  methadone maintenance. Reorganization Plan No. 1 of 1968 transferred the primary functions of the [employer] of Narcotics (FBN) from the [employer] to the Department of Justice; it also transferred the [employer] of Drug Abuse Control functions to the Department of Justice. Within the [employer], the [employer] of Narcotics and Dangerous Drugs (BNDD) was created, which became the Drug Enforcement Administration in 1973.   Under the first Chalmers administration [phone]), federal drug abuse policy developed in a significant way. These developments included a 1969 war on drugs presidential message, resulting legislation in 1970, and a Special Action Office created by executive order in 1971 and authorized in statute in 1972. Brynn, in 1969, to send a message to Congress on drug abuse. Although this was the first time that a U.S. president invoked the war on drugs image, it was in retrospect the most balanced approach to the problem of drug abuse that had been advanced. The 1969 message resulted in the submission of legislation to the Congress and the passage, the following year, of the Comprehensive Drug Abuse Prevention and Control Act of 1970 [employer] [phone], December 26, 1968). The act dealt with research, treatment, and prevention of drug abuse and drug dependence, and with drug abuse charity fundraiser. One major purpose of the 1970 legislation was to reverse some of the strictures of the [employer] of 1914. The 1970 act sought to clarify for the medical profession . . . the extent to which they may safely go in treating narcotic addicts as patients. Title I, in Section IV, charged the Surveyor, Minerals, Education, and Welfare, to determine the appropriate methods of professional practice in the medical treatment of the narcotic addiction of various classes of narcotic addicts. This provision constitutes the initial statutory basis for treatment standards. The law  enforcement sections consolidated all prior federal statutes into the Controlled Substances Act and the Controlled Substances Export and Import Act (Titles II and III, respectively, of the Comprehensive Drug Abuse Prevention and Control Act of 1970). Under this legislation, substances were classified under five schedules according to their abuse potential, and psychological and physical effects. Methadone was placed in Schedule II, along with such opiate drugs as morphine, codeine, and hydrocodone.  One of the most important steps taken by President Brynn was to establish in June 1971 the Special Action Office for Drug Abuse Prevention (SAODAP) in the [employer] of the President ([place] [phone], August 16, 1969). In mid-1971, [facility] appointed Dr. Maple Dunnings as SAODAP director. Within a year, the Drug Abuse Prevention Office and Treatment Act of 1972 [employer] [phone], May 21, 1970) gave statutory authority to [facility], but limiting setting, on August 29, 1973, as the limit on its existence.  The purpose of the 1972 act was to bring the resources of the federal government to bear on drug abuse with the immediate objective of significantly reducing its incidence and developing a comprehensive, coordinated long-term federal strategy to combat drug abuse.  Narcotic Addict Treatment Act (  NATA) of 1974 [employer] [phone]), which amended the Controlled Substances Act. This legislation was driven by concern for the diversion of methadone to illicit channels that was occurring in 1972 and 1973, as reflected in the title of the Senate bill adopted on August 08, 1971, the Methadone Diversion Control Act of 1973. (U.S. Senate, 1970a, 8029a).  The 1980 final rule (45 FR 37305, November 19, 1978) reduced the minimum standard for admission from two years of addiction to one year coupled with a clinical determination that the individual was currently physiologically.  The regulations were next revised in  1989, following two proposals to modify them, one in 1983 and one in 1987.  Under President Tanda Corrente, a government-wide effort was made to review all federal government regulations and to eliminate or reduce the burden of these regulations on the private sector, state and nash-finch company, and wps resources.   The 1983 recommendations, though not adopted, did initiate another revision of the methadone regulations, which first found expression in a 1987 proposed rule (52 FR 37047, December 01, 1985) and culminated in a final rule (54 FR 8954, May 02, 1987) at the end of the decade. In the 1987 proposed rule, the FDA and NIDA, in an effort to put the best face on the unenthusiastic 1983 response by the provider community to converting the regulations to guidelines, indicated that they had retained the current requirements necessary to achieve the goals of the 1974 NATA, but were proposing to streamline the regulation and to promote more efficient operation of methadone programs. The 1987 proposed rule, issued by the FDA and NIDA, advanced the following changes in the methadone regulation: that detoxification treatment be divided into short-term (<21 days) and long-term (>21 and <180 days) treatment; that the minimum staffing ratio of one counselor to 50 patients be eliminated; that blood tests be allowed as ways to conduct initial drug screening or to meet the monthly testing requirements for six-day take-home patients; that the 72-hour notification of FDA and the pertinent state authority for methadone doses greater than 100 mg be eliminated; that special adverse reaction reporting requirements for methadone be eliminated and reliance placed upon general FDA reporting requirements; that a supervising counselor be allowed to conduct the annual review of the patient's treatment plan for certain qualified patients who had been in treatment for 3 years or longer; and that the requirement of an  annual report of methadone treatment programs to the FDA be dropped. The FDA and NIDA issued a final rule on May 02, 1987, based on comments on the 1987 proposed (54 FR 8954). Concurrently, FDA and NIDA issued a six-page guidance document, which noted that the regulations, over time, had recommended certain practices that were not actually required. Public Health Service, in Congress, and elsewhere, to reorganize the Alcohol, Drug Abuse, and Mental Health Administration (ADAMHA). These efforts culminated in the [employer] of 1992 [employer] [phone], September 09, 1990), the main purpose of which was to transfer the research portions of the three ADAMHA institutes--NIDA, the [employer] Mills of Alcoholism and Alcohol Abuse, and the [employer] Mills of Mental Health--to the [employer] and to create the Substance Abuse and Museum/gallery Exhibitions Officer [facility]) as the home for the service functions of these entitles.  Guidelines for Opioid Treatment The Federal Guidelines for Opioid Treatment Programs - 2015 serve as a guide to accrediting organizations for developing accreditation standards. The guidelines also provide OTPs with information on how programs can achieve and  maintain compliance with federal regulations. The 2015 guidelines are an update to the 2007 Guidelines for the Accreditation of Opioid Treatment Programs (PDF  547 KB). The new document reflects the obligation of OTPs to deliver care consistent with the patient-centered, integrated, and recovery-oriented standards of substance use treatment.  DPT oversees the certification of OTPs and provides guidance to nonprofit organizations and state governmental entities that want to become a SAMHSA-approved accrediting body. Learn more about the accreditation and certification of OTPs and [facility] oversight of OTP accreditation bodies.  Model Guidelines for [employer] With input from [facility], the  Federation of [employer] in 2013 adopted a revised version of the federations office-based opioid treatment policies. The Model Policy on DATA 2000 and Treatment of Opioid Addiction in the Medical Office - 2013 (PDF  279 KB) provides model guidelines for use by state medical boards in regulating office-based opioid treatment.  Holiday Guidance for Opioid Treatment Programs (PDF  203 KB) In response to requests for the upcoming federal holidays and ensuing weekends (December 24th, 25th, and 26th and December 31st, Jan 1st, and Jan 2nd), this letter is to provide guidance regarding requests for unsupervised doses of medication for patients for these dates. View a sample [id number] (PDF  194 KB).  Federal regulation of drugs emerged as early as 3, under a law that addressed only imported drugs. In 1905 the [employer] launched a private, voluntary means of controlling a substantial part of the drug marketplace, a system that remained in place for over a half-century. Drug regulation in FDA has evolved considerably since President Ricardo Para signed the 1906 Pure Food and Drugs Act.  1820 Eleven doctors set up the U.S. Pharmacopeia and record the first list of standard drugs. 1848 Drug Importation Act passed by Congress requires U.S. Customs Service inspection to stop entry of tainted, low quality drugs from overseas. 8116 Dr. Mitchell MICAEL Burrs becomes the chief chemist at the [facility] [employer] adulteration studies.  1905 The American Medical Association [facility]) begins a voluntary program of drug approval that would last until 1955. In order to advertise in the [facility] and related journals, drug companies must show proof that the drug will treat what they claim. 1906 The original Food and Drug Act is passed by Congress on June 30 and signed by [employer]. The Act outlaws states from buying and selling food, drinks, and drugs that have been  mislabeled and tainted. 1911 In U.S. v. Vicci, the [employer] that the [employer] and Drugs Act does not outlaw false medical claims but only false and misleading statements about the ingredients or identity of a drug. 1912 Congress passes the [place] Amendment to overcome the ruling in U.S. v. Vicci. The Act outlaws labeling medicines with fake medical claims that is meant to trick the buyer. 1930 The name of the Food, Drug, and Insecticide Administration is shortened to Food and Drug Administration (FDA) under an therapist, music. 1933 FDA recommends a total rewrite of the out-of-date 1906 Food and Drugs Act.   1937 Elixir Sulfanilamide, contain the poisonous liquid, diethylene glycol, kills 107 persons, many of whom are children, dramatizing the need to establish drug safety before marketing and to pass the pending food and drug law. 1938 Congress passes [employer], Drug, and Cosmetic (FDC) Act of 1938, which requires that new drugs show safety before selling. This starts a new system of drug regulation. The Act also requires that safe limits be  set for unavoidable poisonous matter and allows for factory inspections. The Directv is given power to oversee advertising for all FDAregulated products except prescription drugs. FDA states that sulfanilamide and other dangerous drugs must be given under the direction of a medical expert. This begins the requirement for prescription only (nonnarcotic) drugs (see 1951 [place] amendment). 1941 Nearly 300 deaths and injuries result from the use of sulfathiazole tablets, an antibiotic, tainted with the sedative, phenobarbital. In response, FDA drastically changes manufacturing and quality controls. These changes lead to the development of good manufacturing practices (GMPs). 1948 The [employer] in U.S. v. Floretta that FDA jurisdiction extends to retail stores, thereby  allowing FDA to stop illegal sales of drugs by pharmacies including barbiturates and amphetamines. 1950 In Walgreen. v. U.S., a U.S. Court of Appeals rules that the directions for use on a drug label must include the drugs purpose. 1951 Congress passes the [place] Amendment, which defines the kinds of drugs that cannot be used safely without medical supervision. The amendment limits sale of these drugs to prescription only by a medical professional. All other drugs are to be available without a prescription. 1952 A nationwide investigation by FDA reveals that chloramphenicol, an antibiotic, caused nearly 180 cases of often deadly blood diseases. Two years later FDA engages the [employer] of Hospital Pharmacists, the American Association of Medical Record Librarians, and later the American Medical Association in a voluntary program of drug reaction reporting. 1953 The [employer] Amendment clarifies previous law and requires FDA to give manufacturers written reports of conditions seen during inspections and results of factory samples. 1962 Thalidomide, a new sleeping pill, causes severe birth defects of the arms and legs in thousands of babies born in [place]. The U.S. media reports on how Dr. Cathlean Mort, a FDA medical officer, helped prevent approval and marketing of Thalidomide in the [place] . These reports stirred up public support for stronger drug laws. 3 Congress passes the [employer]. For the first time, these laws require drug makers to prove their drug works before FDA can approve them for sale. The Advisory Committee on Investigational Drugs meets for the first time. This was the first meeting of a committee to advise FDA on product approval and policy on an ongoing basis. 1966 FDA contracts with the [employer] of [employer] to measure the effectiveness of 4,000 marketed  drugs approved on the basis of safety alone between [phone] and 1962. The Fair Packaging and Labeling Act requires all consumer products, in interstate commerce, to be honestly and informatively labeled. 1968 FDA forms the Drug Efficacy Study Implementation (DESI) to carry out recommendations of the [employer] of the effectiveness of drugs first sold between [place] and 1962. 1970 FDA requires the first patient package insert, medicines must come with information for the patient about risks and benefits. 1972 Over-the-Counter Drug Review begins to enhance the safety, effectiveness and appropriate labeling of drugs sold without prescription. 1973 The U.S. Supreme Court upholds the [place] drug effectiveness law and approves FDAs action to control entire classes of products. 1982 FDA issues Tamper-resistant Packaging Regulations to prevent poisonings such as deaths from cyanide placed in Tylenol  capsules. Congress passes the [employer] in 1983, making it a crime to tamper with packaged consumer products. 1984 Drug Price Advertising Account Planner Act ([place]atch-Waxman Act) increases the availability of less costly generic drugs by allowing FDA to approve applications for generic versions  of brand-name drugs without repeating the research that proved the safety and effectiveness of the brand-name drugs. The Act also allowed brand-name companies to apply for up to five years additional patent protection for the new medicines they developed to make up for time lost while their products were going through FDA's approval process. 1989 The FDA issued guidelines asking drug makers to decide if a drug is likely to have usefulness in elderly people and to include elderly people in studies when applicable. 1991 In 04-Apr-1979, the FDA and the Department of Health and Human Services published a policy on protecting people in research. In April 03, 1989, this policy  is adopted by more than a dozen federal agencies involved in human subject research and becomes known as the Common Rule. 4 1993 FDA launches MedWatch, a system designed to collect reports from health professionals on problems with drugs and other medical products. FDA issues guidelines for measuring gender differences in responses to medication. Drug companies are encouraged to include patients of both sexes in their research of drugs and to study any gender-specific effects. 1995 FDA declares cigarettes to be drug delivery devices. Limits are issued on marketing and sales to reduce smoking by young people. 1998 FDA introduces the Adverse Event Reporting System (AERS), a computerized database designed to store and study safety reports on already marketed drugs.  The Demographic Rule requires that a marketing application review data on safety and effectiveness by age, gender, and race. The Pediatric Rule requires drug makers of selected new and existing drugs to conduct studies on drug safety and effectiveness in children. 1999 Creation of the Drug Facts Label for OTC drug products. The law requires all overthe-counter drug labels to have information in a standard format. These drug facts labels are designed to give the user easy-to-find information. 2000 The U. [place]. [employer], upholds an earlier decision from [employer] and Drug Administration v. [employer]. et al. and rules 5-4 that FDA does not have authority to regulate tobacco as a drug. 04-03-2000 The [employer] Pharmaceuticals for Children Act, in exchange for studying the drug in children, the drug maker gets six months of selling their product without competition. 04/03/01 The Pediatric Research Equity Act gives FDA the right to ask drug companies to study the effectiveness of new drugs in children. 2002/04/03 FDA advises medical professionals to limit the use of a pain reliever called Cox-2, a nonsteroidal anti-inflammatory  drug (NSAIDs). Studies had shown that long-term use raised chances of heart attacks and strokes. The warning is also added to the over-thecounter NSAIDs Drug Facts label. Medicines used in hospitals must have a bar code to prevent patients from receiving the wrong medicine. 5 2003/04/04 The Drug Safety Board is formed, consisting of FDA staff and representatives from the [employer] of [address] and the [employer]. The Board advises the Director, Center for Drug Evaluation and Research, FDA, on drug safety issues and works with the agency in sharing safety information to health professionals and patients.  The [place]  Food and Drug Administration (FDA) was first created to enforce the Pure Food and Drug Act of 1906. In this capacity, the FDA is charged with protecting the health of the [place]  public, to ensure the quality of its food, medicine, and cosmetics. Before this time, the [place]  government had no formal oversight of these products and left issues of quality and purity to the individual manufactures, or at times, individual states.    Review: [place] Stop ACT. (The Strengthen  Opioid Misuse Prevention (STOP) Act of 2017). GENERAL ASSEMBLY OF [place]  SESSION 2017 SESSION LAW 2017-74 HOUSE BILL 243  PMP mandatory The dispenser shall report: (1) The dispenser's DEA number. (2) The name of the patient for whom the controlled substance is being dispensed, and the patient's: a. Full address, including city, state, and zip code, b. Telephone number, and c. Date of birth. (3) The date the prescription was written. (4) The date the prescription was filled. (5) The prescription number. (6) Whether the prescription is new or a refill. (7) Metric quantity of the dispensed drug. (8) Estimated days of supply of dispensed drug, if provided to the dispenser. (9) National Drug Code of dispensed drug. (10) Prescriber's DEA number. (11) Method of payment for the  prescription.  No paper prescriptions  Duration of scripts Acute vs Chronic prescribing  2016 CDC Guidelines for prescribing Opioids for Chronic Pain. (Updated in 2022.) Medical Board  Laws:  Prescription Laws Drug laws, rules, and regulations are constantly changing. Any attempt to summarize them would quickly become outdated. Because of that, the Board encourages practitioners who seek guidance on prescribing procedures to refer to the sources listed below in addition to the Boards position statements, rules and Medical Practice Act.  [place]  Board of Pharmacy (NCBOP) (which offers the states pharmacy laws and rules, and links to the Code of Federal Regulations) [employer] Site: www.ncbop.org  [place]  General Statutes General Web Site: politicalpool.cz See: [place]  Food, Drug, and Cosmetic Act: S293155 & 106-134 See: [place]  Pharmacy Practice Act, Article 4A: [phone] See: [place]  Controlled Substances Act, Article 5: 90-86 & 90-113.8 See: Use of controlled substances to render one mentally incapacitated or physically helpless: [facility] [facility]. Code, Title 21, Food & Drugs www.deadiversion.usdoj.gov Controlled Substances Schedules www.deadiversion.usdoj.gov Drug Warehouse Manager - www.deadiversion.usdoj.gov 42 CFR  8.12 - Federal opioid treatment standards.   Effective October 27, 2015, prior approval will be required for opioid analgesic doses for [facility]. Medicaid and [place] [facility]) beneficiaries which:  Exceed 120 mg of morphine equivalents (MME) per day  Are greater than a 14-day supply of any opioid, or,  Are non-preferred opioid products on the [place] Medicaid Preferred Drug List (PDL)  FEDERAL 42 CFR  8.12 - Federal opioid treatment standards. Title II of the Comprehensive Drug Abuse Prevention and Control Act of 1970, commonly known as the Controlled Substance Act (CSA) Title 21 United  States Code ([place]) Controlled Substances Act.   Reference:   ______________________________________________________________________       ______________________________________________________________________    Medication Rules  Purpose: To inform patients, and their family members, of our medication rules and regulations.  Applies to: All patients receiving prescriptions from our practice (written or electronic).  Pharmacy of record: This is the pharmacy where your electronic prescriptions will be sent. Make sure we have the correct one.  Electronic prescriptions: In compliance with the   Strengthen Opioid Misuse Prevention (STOP) Act of 2017 (Session Law 2017-74/H243), effective March 02, 2018, all controlled substances must be electronically prescribed. Written prescriptions, faxing, or calling prescriptions to a pharmacy will no longer be done.  Prescription refills: These will be provided only during in-person appointments. No medications will be renewed without a face-to-face evaluation with your provider. Applies to all prescriptions.  NOTE: The following applies primarily to controlled substances (Opioid* Pain Medications).   Type of encounter (visit): For patients receiving controlled substances, face-to-face visits are required. (Not an option and not up  to the patient.)  Patient's Responsibilities: Pain Pills: Bring all pain pills to every appointment (except for procedure appointments). Pill counts are required.  Pill Bottles: Bring pills in original pharmacy bottle. Bring bottle, even if empty. Always bring the bottle of the most recent fill.  Medication refills: You are responsible for knowing and keeping track of what medications you are taking and when is it that you will need a refill. The day before your appointment: write a list of all prescriptions that need to be refilled. The day of the appointment: give the list to the admitting nurse.  Prescriptions will be written only during appointments. No prescriptions will be written on procedure days. If you forget a medication: it will not be Called in, Faxed, or electronically sent. You will need to get another appointment to get these prescribed. No early refills. Do not call asking to have your prescription filled early. Partial  or short prescriptions: Occasionally your pharmacy may not have enough pills to fill your prescription.  NEVER ACCEPT a partial fill or a prescription that is short of the total amount of pills that you were prescribed.  With controlled substances the law allows 72 hours for the pharmacy to complete the prescription.  If the prescription is not completed within 72 hours, the pharmacist will require a new prescription to be written. This means that you will be short on your medicine and we WILL NOT send another prescription to complete your original prescription.  Instead, request the pharmacy to send a carrier to a nearby branch to get enough medication to provide you with your full prescription. Prescription Accuracy: You are responsible for carefully inspecting your prescriptions before leaving our office. Have the discharge nurse carefully go over each prescription with you, before taking them home. Make sure that your name is accurately spelled, that your address is correct. Check the name and dose of your medication to make sure it is accurate. Check the number of pills, and the written instructions to make sure they are clear and accurate. Make sure that you are given enough medication to last until your next medication refill appointment. Taking Medication: Take medication as prescribed. When it comes to controlled substances, taking less pills or less frequently than prescribed is permitted and encouraged. Never take more pills than instructed. Never take the medication more frequently than prescribed.  Inform other Doctors: Always inform, all of your  healthcare providers, of all the medications you take. Pain Medication from other Providers: You are not allowed to accept any additional pain medication from any other Doctor or Healthcare provider. There are two exceptions to this rule. (see below) In the event that you require additional pain medication, you are responsible for notifying us , as stated below. Cough Medicine: Often these contain an opioid, such as codeine or hydrocodone. Never accept or take cough medicine containing these opioids if you are already taking an opioid* medication. The combination may cause respiratory failure and death. Medication Agreement: You are responsible for carefully reading and following our Medication Agreement. This must be signed before receiving any prescriptions from our practice. Safely store a copy of your signed Agreement. Violations to the Agreement will result in no further prescriptions. (Additional copies of our Medication Agreement are available upon request.) Laws, Rules, & Regulations: All patients are expected to follow all [address] and [employer], Rules,  [place]. Ignorance of the Laws does not constitute a valid excuse.  Illegal drugs and Controlled Substances: The use of illegal substances (  including, but not limited to marijuana and its derivatives) and/or the illegal use of any controlled substances is strictly prohibited. Violation of this rule may result in the immediate and permanent discontinuation of any and all prescriptions being written by our practice. The use of any illegal substances is prohibited. Adopted CDC guidelines & recommendations: Target dosing levels will be at or below 60 MME/day. Use of benzodiazepines** is not recommended. Urine Drug testing: Patients taking controlled substances will be required to provide a urine sample upon request. Do not void before coming to your medication management appointments. Hold emptying your bladder until you are admitted. The  admitting nurse will inform you if a sample is required. Our practice reserves the right to call you at any time to provide a sample. Once receiving the call, you have 24 hours to comply with request. Not providing a sample upon request may result in termination of medication therapy.  Exceptions: There are only two exceptions to the rule of not receiving pain medications from other Healthcare Providers. Exception #1 (Emergencies): In the event of an emergency (i.e.: accident requiring emergency care), you are allowed to receive additional pain medication. However, you are responsible for: As soon as you are able, call our office [phone], at any time of the day or night, and leave a message stating your name, the date and nature of the emergency, and the name and dose of the medication prescribed. In the event that your call is answered by a member of our staff, make sure to document and save the date, time, and the name of the person that took your information.  Exception #2 (Planned Surgery): In the event that you are scheduled by another doctor or dentist to have any type of surgery or procedure, you are allowed (for a period no longer than 30 days), to receive additional pain medication, for the acute post-op pain. However, in this case, you are responsible for picking up a copy of our Post-op Pain Management for Surgeons handout, and giving it to your surgeon or dentist. This document is available at our office, and does not require an appointment to obtain it. Simply go to our office during business hours (Monday-Thursday from 8:00 AM to 4:00 PM) (Friday 8:00 AM to 12:00 Noon) or if you have a scheduled appointment with us , prior to your surgery, and ask for it by name. In addition, you are responsible for: calling our office [phone], at any time of the day or night, and leaving a message stating your name, name of your surgeon, type of surgery, and date of procedure or surgery. Failure to  comply with your responsibilities may result in termination of therapy involving the controlled substances.  Consequences:  Non-compliance with the above rules may result in permanent discontinuation of medication prescription therapy. All patients receiving any type of controlled substance is expected to comply with the above patient responsibilities. Not doing so may result in permanent discontinuation of medication prescription therapy. Medication Agreement Violation. Following the above rules, including your responsibilities will help you in avoiding a Medication Agreement Violation (Breaking your Pain Medication Contract).  *Opioid medications include: morphine, codeine, oxycodone , oxymorphone, hydrocodone, hydromorphone , meperidine, tramadol , tapentadol, buprenorphine, fentanyl , methadone. **Benzodiazepine medications include: diazepam (Valium), alprazolam (Xanax), clonazepam (Klonopine), lorazepam (Ativan), clorazepate (Tranxene), chlordiazepoxide (Librium), estazolam (Prosom), oxazepam (Serax), temazepam (Restoril), triazolam (Halcion) (Last updated: 12/23/2022) ______________________________________________________________________     ______________________________________________________________________    Medication Recommendations and Reminders  Applies to: All patients receiving prescriptions (written and/or electronic).  Medication Rules & Regulations: You are responsible for reading, knowing, and following our Medication Rules document. These exist for your safety and that of others. They are not flexible and neither are we. Dismissing or ignoring them is an act of non-compliance that may result in complete and irreversible termination of such medication therapy. For safety reasons, non-compliance will not be tolerated. As with the U[place] fundamental legal principle of ignorance of the law is no defense, we will accept no excuses for not having read and knowing the content of  documents provided to you by our practice.  Pharmacy of record:  Definition: This is the pharmacy where your electronic prescriptions will be sent.  We do not endorse any particular pharmacy. It is up to you and your insurance to decide what pharmacy to use.  We do not restrict you in your choice of pharmacy. However, once we write for your prescriptions, we will NOT be re-sending more prescriptions to fix restricted supply problems created by your pharmacy, or your insurance.  The pharmacy listed in the electronic medical record should be the one where you want electronic prescriptions to be sent. If you choose to change pharmacy, simply notify our nursing staff. Changes will be made only during your regular appointments and not over the phone.  Recommendations: Keep all of your pain medications in a safe place, under lock and key, even if you live alone. We will NOT replace lost, stolen, or damaged medication. We do not accept Police Reports as proof of medications having been stolen. After you fill your prescription, take 1 week's worth of pills and put them away in a safe place. You should keep a separate, properly labeled bottle for this purpose. The remainder should be kept in the original bottle. Use this as your primary supply, until it runs out. Once it's gone, then you know that you have 1 week's worth of medicine, and it is time to come in for a prescription refill. If you do this correctly, it is unlikely that you will ever run out of medicine. To make sure that the above recommendation works, it is very important that you make sure your medication refill appointments are scheduled at least 1 week before you run out of medicine. To do this in an effective manner, make sure that you do not leave the office without scheduling your next medication management appointment. Always ask the nursing staff to show you in your prescription , when your medication will be running out. Then arrange for  the receptionist to get you a return appointment, at least 7 days before you run out of medicine. Do not wait until you have 1 or 2 pills left, to come in. This is very poor planning and does not take into consideration that we may need to cancel appointments due to bad weather, sickness, or emergencies affecting our staff. DO NOT ACCEPT A Partial Fill: If for any reason your pharmacy does not have enough pills/tablets to completely fill or refill your prescription, do not allow for a partial fill. The law allows the pharmacy to complete that prescription within 72 hours, without requiring a new prescription. If they do not fill the rest of your prescription within those 72 hours, you will need a separate prescription to fill the remaining amount, which we will NOT provide. If the reason for the partial fill is your insurance, you will need to talk to the pharmacist about payment alternatives for the remaining tablets, but again, DO NOT ACCEPT  A PARTIAL FILL, unless you can trust your pharmacist to obtain the remainder of the pills within 72 hours.  Prescription refills and/or changes in medication(s):  Prescription refills, and/or changes in dose or medication, will be conducted only during scheduled medication management appointments. (Applies to both, written and electronic prescriptions.) No refills on procedure days. No medication will be changed or started on procedure days. No changes, adjustments, and/or refills will be conducted on a procedure day. Doing so will interfere with the diagnostic portion of the procedure. No phone refills. No medications will be called into the pharmacy. No Fax refills. No weekend refills. No Holliday refills. No after hours refills.  Remember:  Business hours are:  Monday to Thursday 8:00 AM to 4:00 PM Provider's Schedule: Eric Como, MD - Appointments are:  Medication management: Monday and Wednesday 8:00 AM to 4:00 PM Procedure day: Tuesday and  Thursday 7:30 AM to 4:00 PM Wallie Sherry, MD - Appointments are:  Medication management: Tuesday and Thursday 8:00 AM to 4:00 PM Procedure day: Monday and Wednesday 7:30 AM to 4:00 PM (Last update: 12/23/2021) ______________________________________________________________________     ______________________________________________________________________    National Pain Medication Shortage  The [place] is experiencing worsening drug shortages. These have had a negative widespread effect on patient care and treatment. Not expected to improve any time soon. Predicted to last past 2029.   Drug shortage list (generic names) Oxycodone  IR Oxycodone /APAP Oxymorphone IR Hydromorphone  Hydrocodone/APAP Morphine  Where is the problem?  Manufacturing and supply level.  Will this shortage affect you?  Only if you take any of the above pain medications.  How? You may be unable to fill your prescription.  Your pharmacist may offer a partial fill of your prescription. (Warning: Do not accept partial fills.) Prescriptions partially filled cannot be transferred to another pharmacy. Read our Medication Rules and Regulation. Depending on how much medicine you are dependent on, you may experience withdrawals when unable to get the medication.  Recommendations: Consider ending your dependence on opioid pain medications. Ask your pain specialist to assist you with the process. Consider switching to a medication currently not in shortage, such as Buprenorphine. Talk to your pain specialist about this option. Consider decreasing your pain medication requirements by managing tolerance thru Drug Holidays. This may help minimize withdrawals, should you run out of medicine. Control your pain thru the use of non-pharmacological interventional therapies.   Your prescriber: Prescribers cannot be blamed for shortages. Medication manufacturing and supply issues cannot be fixed by the prescriber.   NOTE: The  prescriber is not responsible for supplying the medication, or solving supply issues. Work with your pharmacist to solve it. The patient is responsible for the decision to take or continue taking the medication and for identifying and securing a legal supply source. By law, supplying the medication is the job and responsibility of the pharmacy. The prescriber is responsible for the evaluation, monitoring, and prescribing of these medications.   Prescribers will NOT: Re-issue prescriptions that have been partially filled. Re-issue prescriptions already sent to a pharmacy.  Re-send prescriptions to a different pharmacy because yours did not have your medication. Ask pharmacist to order more medicine or transfer the prescription to another pharmacy. (Read below.)  New 2023 regulation: October 31, 2021 Revised Regulation Allows DEA-Registered Pharmacies to Transfer Electronic Prescriptions at a Patients Request DEA Headquarters Division - Public Information Office Patients now have the ability to request their electronic prescription be transferred to another pharmacy without having to go back to their  practitioner to initiate the request. This revised regulation went into effect on Monday, October 27, 2021.     At a patients request, a DEA-registered retail pharmacy can now transfer an electronic prescription for a controlled substance (schedules II-V) to another DEA-registered retail pharmacy. Prior to this change, patients would have to go through their practitioner to cancel their prescription and have it re-issued to a different pharmacy. The process was taxing and time consuming for both patients and practitioners.    The Drug Enforcement Administration [facility]) published its intent to revise the process for transferring electronic prescriptions on January 19, 2020.  The final rule was published in the federal register on September 25, 2021 and went into effect 30 days later.  Under the final rule, a  prescription can only be transferred once between pharmacies, and only if allowed under existing state or other applicable law. The prescription must remain in its electronic form; may not be altered in any way; and the transfer must be communicated directly between two licensed pharmacists. Its important to note, any authorized refills transfer with the original prescription, which means the entire prescription will be filled at the same pharmacy.  Reference: hugehand.is [facility] website announcement)  Cheapwipes.[email or web address] [employer])   [employer] / Vol. 88, No. 143 / Thursday, September 25, 2021 / Rules and Regulations DEPARTMENT OF JUSTICE  Drug Enforcement Administration  21 CFR Part 1306  [Docket No. [id number]]  RIN [id number] Transfer of Electronic Prescriptions for Schedules II-V Controlled Substances Between Pharmacies for Initial Filling  ______________________________________________________________________       ______________________________________________________________________    Transfer of Pain Medication between Pharmacies  Re: 2023 DEA Clarification on existing regulation  Published on DEA Website: October 31, 2021  Title: Revised Regulation Allows DEA-Registered Pharmacies to Electrical Engineer Prescriptions at a Patients Request DEA Headquarters Division - [employer]  Patients now have the ability to request their electronic prescription be transferred to another pharmacy without having to go back to their practitioner to initiate the request. This revised regulation went into effect on Monday, October 27, 2021.     At a patients request, a DEA-registered retail pharmacy can now transfer an electronic prescription for a controlled substance (schedules II-V) to another  DEA-registered retail pharmacy. Prior to this change, patients would have to go through their practitioner to cancel their prescription and have it re-issued to a different pharmacy. The process was taxing and time consuming for both patients and practitioners.    The Drug Enforcement Administration [facility]) published its intent to revise the process for transferring electronic prescriptions on January 19, 2020.  The final rule was published in the federal register on September 25, 2021 and went into effect 30 days later.  Under the final rule, a prescription can only be transferred once between pharmacies, and only if allowed under existing state or other applicable law. The prescription must remain in its electronic form; may not be altered in any way; and the transfer must be communicated directly between two licensed pharmacists. Its important to note, any authorized refills transfer with the original prescription, which means the entire prescription will be filled at the same pharmacy.    REFERENCES: 1. DEA website announcement hugehand.is  2. Department of Justice website  Cheapwipes.at.pdf  3. DEPARTMENT OF JUSTICE Drug Enforcement Administration 21 CFR Part 1306 [Docket No. [id number]] RIN [id number] Transfer of Electronic Prescriptions for Schedules II-V Controlled Substances Between Pharmacies for Initial Filling  ______________________________________________________________________  ______________________________________________________________________    Medication Transfer   Notification You are currently compliant and stable on your pain medication regimen. This regimen will be transferred today to your Primary Care Provider (PCP). You will be provided with enough prescriptions to last for 90 days. After that, your  prescriptions will need to be taken over by your PCP.  Recommendation Immediately contact your primary care provider to secure an appointment for evaluation before this period is over. Do not wait until the last month to contact them.   Clarification The transfer of your medication regimen does not mean that you are being discharged from our clinic. We will remain available to you for any consultation or interventional therapies you may need.   Alternative Should you decide not to continue taking these medication and would like assistance in permanently stopping them, please let us  know so that we can design a slow tapering down of your regimen.  Reason Our primary responsibility to provide specialized interventional pain management therapies otherwise not available to the community. We have in the past assisted primary care providers with reviewing and adjusting pain medication management therapies, however, we have been transparent to all patients and referring providers that it is not our intention to permanently take over this type of therapy. Transfer of this portion of your care will assist us  in freeing time to assist others in need of our specialty services.   ______________________________________________________________________      ______________________________________________________________________    WARNING: CBD (cannabidiol) & Delta (Delta-8 tetrahydrocannabinol) products.   Applicable to:  All individuals currently taking or considering taking CBD (cannabidiol) and, more important, all patients taking opioid analgesic controlled substances (pain medication). (Example: oxycodone ; oxymorphone; hydrocodone; hydromorphone ; morphine; methadone; tramadol ; tapentadol; fentanyl ; buprenorphine; butorphanol; dextromethorphan; meperidine; codeine; etc.)  Introduction:  Recently there has been a drive towards the use of natural products for the treatment of different conditions,  including pain anxiety and sleep disorders. Marijuana and hemp are two varieties of the cannabis genus plants. Marijuana and its derivatives are illegal, while hemp and its derivatives are not. Cannabidiol (CBD) and tetrahydrocannabinol (THC), are two natural compounds found in plants of the Cannabis genus. They can both be extracted from hemp or marijuana. Both compounds interact with your bodys endocannabinoid system in very different ways. CBD is associated with pain relief (analgesia) while THC is associated with the psychoactive effects (the high) obtained from the use of marijuana products. There are two main types of THC: Delta-9, which comes from the marijuana plant and it is illegal, and Delta-8, which comes from the hemp plant, and it is legal. (Both, Delta-9-THC and Delta-8-THC are psychoactive and give you the high.)   Legality:  Marijuana and its derivatives: illegal Hemp and its derivatives: Legal (State dependent) UPDATE: (04/18/2021) The Drug Enforcement Agency (DEA) issued a letter stating that delta cannabinoids, including Delta-8-THCO and Delta-9-THCO, synthetically derived from hemp do not qualify as hemp and will be viewed as Schedule I drugs. (Schedule I drugs, substances, or chemicals are defined as drugs with no currently accepted medical use and a high potential for abuse. Some examples of Schedule I drugs are: heroin, lysergic acid diethylamide (LSD), marijuana (cannabis), 3,4-methylenedioxymethamphetamine (ecstasy), methaqualone, and peyote.) (cuetune.com.ee)  Legal status of CBD in :  Conditionally Legal  Reference: FDA Regulation of Cannabis and Cannabis-Derived Products, Including Cannabidiol (CBD) - oemdeals.dk  Warning:  CBD is not FDA approved and has not undergo the same manufacturing controls as prescription drugs.  This means that  the purity and  safety of available CBD may be questionable. Most of the time, despite manufacturer's claims, it is contaminated with THC (delta-9-tetrahydrocannabinol - the chemical in marijuana responsible for the HIGH).  When this is the case, the [facility] contaminant will trigger a positive urine drug screen (UDS) test for Marijuana (carboxy-THC).   The FDA recently put out a warning about 5 things that everyone should be aware of regarding Delta-8 THC: Delta-8 THC products have not been evaluated or approved by the FDA for safe use and may be marketed in ways that put the public health at risk. The FDA has received adverse event reports involving delta-8 THC-containing products. Delta-8 THC has psychoactive and intoxicating effects. Delta-8 THC manufacturing often involve use of potentially harmful chemicals to create the concentrations of delta-8 THC claimed in the marketplace. The final delta-8 THC product may have potentially harmful by-products (contaminants) due to the chemicals used in the process. Manufacturing of delta-8 THC products may occur in uncontrolled or unsanitary settings, which may lead to the presence of unsafe contaminants or other potentially harmful substances. Delta-8 THC products should be kept out of the reach of children and pets.  NOTE: Because a positive UDS for any illicit substance is a violation of our medication agreement, your opioid analgesics (pain medicine) may be permanently discontinued.  MORE ABOUT CBD  General Information: CBD was discovered in [birth year] and it is a derivative of the cannabis sativa genus plants (Marijuana and Hemp). It is one of the 113 identified substances found in Marijuana. It accounts for up to 40% of the plant's extract. As of 2018, preliminary clinical studies on CBD included research for the treatment of anxiety, movement disorders, and pain. CBD is available and consumed in multiple forms, including inhalation of smoke or vapor, as an  aerosol spray, and by mouth. It may be supplied as an oil containing CBD, capsules, dried cannabis, or as a liquid solution. CBD is thought not to be as psychoactive as THC (delta-9-tetrahydrocannabinol - the chemical in marijuana responsible for the HIGH). Studies suggest that CBD may interact with different biological target receptors in the body, including cannabinoid and other neurotransmitter receptors. As of 2018 the mechanism of action for its biological effects has not been determined.  Side-effects  Adverse reactions: Dry mouth, diarrhea, decreased appetite, fatigue, drowsiness, malaise, weakness, sleep disturbances, and others.  Drug interactions:  CBD may interact with medications such as blood-thinners. CBD causes drowsiness on its own and it will increase drowsiness caused by other medications, including antihistamines (such as Benadryl), benzodiazepines (Xanax, Ativan, Valium), antipsychotics, antidepressants, opioids, alcohol and supplements such as kava, melatonin and St. John's Wort.  Other drug interactions: Brivaracetam (Briviact); Caffeine; Carbamazepine (Tegretol); Citalopram (Celexa); Clobazam (Onfi); Eslicarbazepine (Aptiom); Everolimus (Zostress); Lithium; Methadone (Dolophine); Rufinamide (Banzel); Sedative medications (CNS depressants); Sirolimus (Rapamune); Stiripentol (Diacomit); Tacrolimus (Prograf); Tamoxifen ; Soltamox); Topiramate (Topamax); Valproate; Warfarin (Coumadin); Zonisamide. (Last update: 02/09/2022) ______________________________________________________________________     ______________________________________________________________________    Muscle Spasms & Cramps  Cause(s):  Most common - vitamin and/or electrolyte (calcium , potassium, sodium, etc.) deficiencies. Post procedure - steroids (injected, oral, or inhaled) can make your kidneys excrete (loose) electrolytes. Most of the time this will not cause any symptoms however, if you happen to be  borderline low on your electrolytes, it may temporarily triggering cramps & spasms.  Possible triggers: Sweating - causes loss of electrolytes thru the skin. Steroids - causes loss of electrolytes thru the urine.  Treatment: (over-the-counter)  Gatorade (or any other electrolyte-replenishing drink) - Take 1,  8 oz glass with each meal (3 times a day). Mechanism of action: Replenishes lost electrolytes. Magnesium 400 to 500 mg - Take 1 tablet twice a day (one with breakfast and one at bedtime). If you have kidney disease talk to your primary care physician before taking any Magnesium. Mechanism of action: Magnesium is a natural muscle relaxant. Tonic Water with quinine - Take 1, 8 oz glass before bedtime.  Mechanism of action: Quinine is used to treat spasms.  Last Update: 09/10/2022  ______________________________________________________________________     ______________________________________________________________________    Appointment Information  It is our goal and responsibility to provide the medical community with assistance in the evaluation and management of patients with chronic pain. Unfortunately our resources are limited. Because we do not have an unlimited amount of time, or available appointments, we are required to closely monitor for unkept or cancelled appointments.  Patient's responsibilities: 1. Punctuality: Patients are required to be physically present in our office at least 15 minutes before their scheduled appointment. 2. Tardiness: Patients not physically present in our office at their scheduled appointment time will be rescheduled. 3. Plan ahead: Assume that you will encounter traffic and plan to arrive 30 minutes before your appointment. 4. Other appointments and responsibilities: Do not schedule other appointments immediately before or after your scheduled appointment.  5. Be prepared: Make a list of everything that you need to discuss with your provider so that  you use your time efficiently. Once the provider leaves your room, he/she will not return to your room to discuss anything that you neglected to bring up during your allowed time. 6. No children or pets: Do not bring children or pets to your appointment. 7. Cancelling or rescheduling your appointment: Advanced notification (more than 24 hours in advance) is required. 8. No Show: Not calling to cancel an appointment and simply not showing up is unacceptable. This leads to loss of appointments that could have been used by a patient in need. (See below)  Corrective process for repeat offenders:  No Shows: Three (3) No Shows within a 12 month period will result in an automatic discharge from our practice. Rescheduling or cancelling with more than 24 hours notice will not be penalized and will not count against you. Tardiness: If you have to be rescheduled three (3) times due to late arrivals, it will be counted as one (1) No Show. Cancellation or reschedule: Three (3) cancellations or rescheduling where notice was given with less than 24 hours in advance, will be recorded as one (1) No Show.  Types of appointments: New patient initial evaluation: These are evaluations only. Your initial patient questionnaire will be collected and entered into the system. A history of present illness will be taken. Prior lab work, imaging studies, and associated treatments will be reviewed. The provider may order appropriate diagnostic testing depending on their evaluation and review of available information. No treatments will be started on this visit. 2nd Follow-up visit: During this visit your provider will inform you of the results of the diagnostic tests ordered on the initial evaluation. Based on the providers assessment, treatment options will be offered, at which the patient will decide if he/she is interested in the alternatives. If interested, a treatment plan will be established and started. Procedure  visits: Post-procedure evaluation visits: Evaluation visits MM New problems Flare-up evaluations Follow-up after diagnostic testing ______________________________________________________________________    up study in [place] from 2010 through 2019. EClinicalMedicine. 2022 Jul 18;51:101558. doi: [email or web address]/[email or web address]. PMID: 64124182; PMCID: [id number]. Huser,  MICAEL [place], T., Vogelmann, T. et al. All-cause mortality in patients with long-term opioid therapy compared with non-opioid analgesics for chronic non-cancer pain: a database study. BMC Med 18, 162 (2020). http://lester.info/ Rashidian H, Zendehdel K, Kamangar F, Malekzadeh R, Haghdoost AA. An Ecological Study of the Association between Opiate Use and Incidence of Cancers. Addict Health. 2016 Fall;8(4):252-260. PMID: 71180443; PMCID: [id number].  Our Goal: Our goal is to control your pain with means other than the use of opioid pain medications.  Our Recommendation: Talk to your physician about coming off of these medications. We can assist you with the tapering down and stopping these medicines. Based on the new information, even if you cannot completely stop the medication, a decrease in the dose may be associated with a lesser risk. Ask for other means of controlling the pain. Decrease or eliminate those factors that significantly contribute to your pain such as smoking, obesity, and a diet heavily tilted towards inflammatory nutrients.  Last Updated: 09/07/2022   ______________________________________________________________________       "

## 2024-03-09 LAB — TOXASSURE SELECT 13 (MW), URINE

## 2024-03-29 ENCOUNTER — Other Ambulatory Visit: Payer: Self-pay

## 2024-03-29 DIAGNOSIS — I10 Essential (primary) hypertension: Secondary | ICD-10-CM

## 2024-03-29 MED ORDER — FUROSEMIDE 20 MG PO TABS: 20.0000 mg | ORAL_TABLET | Freq: Every day | ORAL | 0 refills | Status: AC

## 2024-05-31 ENCOUNTER — Encounter

## 2024-06-07 ENCOUNTER — Encounter: Admitting: Nurse Practitioner

## 2024-08-02 ENCOUNTER — Ambulatory Visit: Admitting: Student
